# Patient Record
Sex: Female | Born: 1955 | Race: Black or African American | Hispanic: No | Marital: Single | Smoking: Current every day smoker
Health system: Southern US, Community
[De-identification: ages and names within clinical notes are randomized; demographics above are authoritative.]

## PROBLEM LIST (undated history)

## (undated) DIAGNOSIS — I82409 Acute embolism and thrombosis of unspecified deep veins of unspecified lower extremity: Secondary | ICD-10-CM

## (undated) DIAGNOSIS — I2699 Other pulmonary embolism without acute cor pulmonale: Secondary | ICD-10-CM

## (undated) DIAGNOSIS — D649 Anemia, unspecified: Secondary | ICD-10-CM

## (undated) DIAGNOSIS — E119 Type 2 diabetes mellitus without complications: Secondary | ICD-10-CM

## (undated) DIAGNOSIS — K5732 Diverticulitis of large intestine without perforation or abscess without bleeding: Secondary | ICD-10-CM

## (undated) DIAGNOSIS — R52 Pain, unspecified: Secondary | ICD-10-CM

## (undated) DIAGNOSIS — A09 Infectious gastroenteritis and colitis, unspecified: Secondary | ICD-10-CM

## (undated) DIAGNOSIS — I6529 Occlusion and stenosis of unspecified carotid artery: Secondary | ICD-10-CM

## (undated) DIAGNOSIS — C801 Malignant (primary) neoplasm, unspecified: Secondary | ICD-10-CM

## (undated) DIAGNOSIS — G4730 Sleep apnea, unspecified: Secondary | ICD-10-CM

## (undated) DIAGNOSIS — I10 Essential (primary) hypertension: Secondary | ICD-10-CM

## (undated) DIAGNOSIS — E669 Obesity, unspecified: Secondary | ICD-10-CM

## (undated) DIAGNOSIS — T7840XA Allergy, unspecified, initial encounter: Secondary | ICD-10-CM

## (undated) DIAGNOSIS — K635 Polyp of colon: Secondary | ICD-10-CM

## (undated) DIAGNOSIS — M1990 Unspecified osteoarthritis, unspecified site: Secondary | ICD-10-CM

## (undated) DIAGNOSIS — Z85048 Personal history of other malignant neoplasm of rectum, rectosigmoid junction, and anus: Secondary | ICD-10-CM

## (undated) DIAGNOSIS — R002 Palpitations: Secondary | ICD-10-CM

## (undated) DIAGNOSIS — C20 Malignant neoplasm of rectum: Secondary | ICD-10-CM

## (undated) HISTORY — DX: Essential (primary) hypertension: I10

## (undated) HISTORY — DX: Type 2 diabetes mellitus without complications: E11.9

## (undated) HISTORY — PX: COLONOSCOPY: SHX174

## (undated) HISTORY — DX: Obesity, unspecified: E66.9

## (undated) HISTORY — PX: ABDOMINAL HYSTERECTOMY: SHX81

## (undated) HISTORY — DX: Anemia, unspecified: D64.9

## (undated) HISTORY — DX: Polyp of colon: K63.5

## (undated) HISTORY — DX: Infectious gastroenteritis and colitis, unspecified: A09

## (undated) HISTORY — PX: APPENDECTOMY: SHX54

## (undated) HISTORY — DX: Diverticulitis of large intestine without perforation or abscess without bleeding: K57.32

## (undated) HISTORY — DX: Allergy, unspecified, initial encounter: T78.40XA

## (undated) HISTORY — PX: TUBAL LIGATION: SHX77

## (undated) HISTORY — DX: Sleep apnea, unspecified: G47.30

## (undated) HISTORY — DX: Personal history of other malignant neoplasm of rectum, rectosigmoid junction, and anus: Z85.048

## (undated) HISTORY — DX: Other pulmonary embolism without acute cor pulmonale: I26.99

## (undated) HISTORY — DX: Acute embolism and thrombosis of unspecified deep veins of unspecified lower extremity: I82.409

## (undated) HISTORY — DX: Malignant neoplasm of rectum: C20

---

## 1997-12-31 ENCOUNTER — Encounter: Admission: RE | Admit: 1997-12-31 | Discharge: 1997-12-31 | Payer: Self-pay | Admitting: Internal Medicine

## 1998-02-09 ENCOUNTER — Emergency Department (HOSPITAL_COMMUNITY): Admission: EM | Admit: 1998-02-09 | Discharge: 1998-02-09 | Payer: Self-pay | Admitting: Emergency Medicine

## 1998-03-18 ENCOUNTER — Encounter: Admission: RE | Admit: 1998-03-18 | Discharge: 1998-03-18 | Payer: Self-pay | Admitting: Internal Medicine

## 1998-03-27 ENCOUNTER — Encounter: Admission: RE | Admit: 1998-03-27 | Discharge: 1998-03-27 | Payer: Self-pay | Admitting: Internal Medicine

## 1998-04-04 ENCOUNTER — Encounter: Admission: RE | Admit: 1998-04-04 | Discharge: 1998-04-04 | Payer: Self-pay | Admitting: Internal Medicine

## 1998-04-17 ENCOUNTER — Encounter: Admission: RE | Admit: 1998-04-17 | Discharge: 1998-04-17 | Payer: Self-pay | Admitting: Internal Medicine

## 1998-07-23 ENCOUNTER — Encounter: Admission: RE | Admit: 1998-07-23 | Discharge: 1998-07-23 | Payer: Self-pay | Admitting: Hematology and Oncology

## 1998-07-23 ENCOUNTER — Ambulatory Visit (HOSPITAL_COMMUNITY): Admission: RE | Admit: 1998-07-23 | Discharge: 1998-07-23 | Payer: Self-pay | Admitting: Hematology and Oncology

## 1998-08-18 ENCOUNTER — Encounter: Admission: RE | Admit: 1998-08-18 | Discharge: 1998-08-18 | Payer: Self-pay | Admitting: Internal Medicine

## 1998-09-18 ENCOUNTER — Ambulatory Visit: Admission: RE | Admit: 1998-09-18 | Discharge: 1998-09-18 | Payer: Self-pay | Admitting: *Deleted

## 1999-03-19 ENCOUNTER — Encounter: Admission: RE | Admit: 1999-03-19 | Discharge: 1999-03-19 | Payer: Self-pay | Admitting: Obstetrics

## 1999-05-11 ENCOUNTER — Encounter: Admission: RE | Admit: 1999-05-11 | Discharge: 1999-05-11 | Payer: Self-pay | Admitting: Internal Medicine

## 1999-06-09 ENCOUNTER — Encounter: Admission: RE | Admit: 1999-06-09 | Discharge: 1999-06-09 | Payer: Self-pay | Admitting: Internal Medicine

## 1999-07-21 ENCOUNTER — Encounter: Admission: RE | Admit: 1999-07-21 | Discharge: 1999-07-21 | Payer: Self-pay | Admitting: Internal Medicine

## 1999-10-20 ENCOUNTER — Encounter: Admission: RE | Admit: 1999-10-20 | Discharge: 1999-10-20 | Payer: Self-pay | Admitting: Internal Medicine

## 1999-11-10 ENCOUNTER — Encounter: Admission: RE | Admit: 1999-11-10 | Discharge: 1999-11-10 | Payer: Self-pay | Admitting: Internal Medicine

## 1999-11-17 ENCOUNTER — Encounter: Admission: RE | Admit: 1999-11-17 | Discharge: 1999-11-17 | Payer: Self-pay | Admitting: Internal Medicine

## 2000-03-11 ENCOUNTER — Encounter: Admission: RE | Admit: 2000-03-11 | Discharge: 2000-03-11 | Payer: Self-pay | Admitting: Internal Medicine

## 2000-03-25 ENCOUNTER — Encounter: Admission: RE | Admit: 2000-03-25 | Discharge: 2000-03-25 | Payer: Self-pay | Admitting: Internal Medicine

## 2000-04-29 ENCOUNTER — Encounter: Admission: RE | Admit: 2000-04-29 | Discharge: 2000-04-29 | Payer: Self-pay | Admitting: Internal Medicine

## 2000-05-27 ENCOUNTER — Encounter: Admission: RE | Admit: 2000-05-27 | Discharge: 2000-05-27 | Payer: Self-pay | Admitting: Internal Medicine

## 2001-02-02 ENCOUNTER — Encounter: Admission: RE | Admit: 2001-02-02 | Discharge: 2001-02-02 | Payer: Self-pay | Admitting: Internal Medicine

## 2001-02-02 ENCOUNTER — Ambulatory Visit (HOSPITAL_COMMUNITY): Admission: RE | Admit: 2001-02-02 | Discharge: 2001-02-02 | Payer: Self-pay

## 2001-04-27 ENCOUNTER — Encounter: Admission: RE | Admit: 2001-04-27 | Discharge: 2001-04-27 | Payer: Self-pay | Admitting: Obstetrics

## 2001-10-16 ENCOUNTER — Encounter: Admission: RE | Admit: 2001-10-16 | Discharge: 2001-10-16 | Payer: Self-pay | Admitting: Internal Medicine

## 2001-10-24 ENCOUNTER — Encounter: Admission: RE | Admit: 2001-10-24 | Discharge: 2001-10-24 | Payer: Self-pay | Admitting: Internal Medicine

## 2001-11-06 ENCOUNTER — Encounter: Admission: RE | Admit: 2001-11-06 | Discharge: 2001-11-06 | Payer: Self-pay | Admitting: Internal Medicine

## 2002-01-04 ENCOUNTER — Encounter: Admission: RE | Admit: 2002-01-04 | Discharge: 2002-01-04 | Payer: Self-pay | Admitting: Internal Medicine

## 2002-08-02 ENCOUNTER — Encounter: Admission: RE | Admit: 2002-08-02 | Discharge: 2002-08-02 | Payer: Self-pay | Admitting: Internal Medicine

## 2002-11-01 ENCOUNTER — Encounter: Admission: RE | Admit: 2002-11-01 | Discharge: 2002-11-01 | Payer: Self-pay | Admitting: Internal Medicine

## 2003-07-23 ENCOUNTER — Ambulatory Visit (HOSPITAL_COMMUNITY): Admission: RE | Admit: 2003-07-23 | Discharge: 2003-07-23 | Payer: Self-pay | Admitting: Obstetrics

## 2005-09-29 ENCOUNTER — Emergency Department (HOSPITAL_COMMUNITY): Admission: EM | Admit: 2005-09-29 | Discharge: 2005-09-29 | Payer: Self-pay | Admitting: Emergency Medicine

## 2005-11-16 ENCOUNTER — Emergency Department (HOSPITAL_COMMUNITY): Admission: EM | Admit: 2005-11-16 | Discharge: 2005-11-16 | Payer: Self-pay | Admitting: *Deleted

## 2005-11-23 ENCOUNTER — Ambulatory Visit: Payer: Self-pay | Admitting: Internal Medicine

## 2005-11-29 ENCOUNTER — Encounter (HOSPITAL_BASED_OUTPATIENT_CLINIC_OR_DEPARTMENT_OTHER): Admission: RE | Admit: 2005-11-29 | Discharge: 2006-02-15 | Payer: Self-pay | Admitting: Surgery

## 2009-01-08 ENCOUNTER — Encounter (HOSPITAL_BASED_OUTPATIENT_CLINIC_OR_DEPARTMENT_OTHER): Admission: RE | Admit: 2009-01-08 | Discharge: 2009-04-08 | Payer: Self-pay | Admitting: Internal Medicine

## 2009-02-05 ENCOUNTER — Ambulatory Visit: Payer: Self-pay | Admitting: *Deleted

## 2009-02-05 ENCOUNTER — Ambulatory Visit: Admission: RE | Admit: 2009-02-05 | Discharge: 2009-02-05 | Payer: Self-pay | Admitting: Internal Medicine

## 2009-02-05 ENCOUNTER — Encounter: Payer: Self-pay | Admitting: Internal Medicine

## 2009-02-27 ENCOUNTER — Encounter (INDEPENDENT_AMBULATORY_CARE_PROVIDER_SITE_OTHER): Payer: Self-pay | Admitting: Internal Medicine

## 2009-02-27 ENCOUNTER — Ambulatory Visit: Payer: Self-pay | Admitting: Internal Medicine

## 2009-02-27 DIAGNOSIS — E66813 Obesity, class 3: Secondary | ICD-10-CM | POA: Insufficient documentation

## 2009-02-27 DIAGNOSIS — I82409 Acute embolism and thrombosis of unspecified deep veins of unspecified lower extremity: Secondary | ICD-10-CM | POA: Insufficient documentation

## 2009-02-27 DIAGNOSIS — Z720 Tobacco use: Secondary | ICD-10-CM | POA: Insufficient documentation

## 2009-02-27 DIAGNOSIS — Z87891 Personal history of nicotine dependence: Secondary | ICD-10-CM

## 2009-02-27 DIAGNOSIS — I10 Essential (primary) hypertension: Secondary | ICD-10-CM | POA: Insufficient documentation

## 2009-02-27 LAB — CONVERTED CEMR LAB
ALT: 16 units/L (ref 0–35)
AST: 15 units/L (ref 0–37)
Albumin: 3.7 g/dL (ref 3.5–5.2)
Alkaline Phosphatase: 80 units/L (ref 39–117)
BUN: 11 mg/dL (ref 6–23)
Basophils Absolute: 0 10*3/uL (ref 0.0–0.1)
Basophils Relative: 0 % (ref 0–1)
CO2: 28 meq/L (ref 19–32)
Calcium: 9.5 mg/dL (ref 8.4–10.5)
Chloride: 106 meq/L (ref 96–112)
Cholesterol: 134 mg/dL (ref 0–200)
Creatinine, Ser: 0.52 mg/dL (ref 0.40–1.20)
Eosinophils Absolute: 0.2 10*3/uL (ref 0.0–0.7)
Eosinophils Relative: 3 % (ref 0–5)
GFR calc Af Amer: >60 mL/min (ref >60)
GFR calc non Af Amer: >60 mL/min (ref >60)
Glucose, Bld: 104 mg/dL — ABNORMAL HIGH (ref 70–99)
HCT: 38.5 % (ref 36.0–46.0)
HDL: 40 mg/dL (ref >39)
Hemoglobin: 13 g/dL (ref 12.0–15.0)
INR: 1 (ref 0.0–1.5)
LDL Cholesterol: 76 mg/dL (ref 0–99)
Lymphocytes Relative: 29 % (ref 12–46)
Lymphs Abs: 1.5 10*3/uL (ref 0.7–4.0)
MCHC: 33.7 g/dL (ref 30.0–36.0)
MCV: 91.6 fL (ref 78.0–100.0)
Monocytes Absolute: 0.4 10*3/uL (ref 0.1–1.0)
Monocytes Relative: 7 % (ref 3–12)
Neutro Abs: 3.3 10*3/uL (ref 1.7–7.7)
Neutrophils Relative %: 62 % (ref 43–77)
Platelets: 274 10*3/uL (ref 150–400)
Potassium: 4.3 meq/L (ref 3.5–5.3)
Prothrombin Time: 13.5 s (ref 11.6–15.2)
RBC: 4.21 M/uL (ref 3.87–5.11)
RDW: 13.3 % (ref 11.5–15.5)
Sodium: 138 meq/L (ref 135–145)
Total Bilirubin: 0.4 mg/dL (ref 0.3–1.2)
Total CHOL/HDL Ratio: 3.4
Total Protein: 6.7 g/dL (ref 6.0–8.3)
Triglycerides: 89 mg/dL (ref <150)
VLDL: 18 mg/dL (ref 0–40)
WBC: 5.4 10*3/uL (ref 4.0–10.5)
aPTT: 25 s (ref 24–37)

## 2009-03-03 ENCOUNTER — Ambulatory Visit: Payer: Self-pay | Admitting: Internal Medicine

## 2009-03-03 LAB — CONVERTED CEMR LAB: INR: 3.1

## 2009-03-04 ENCOUNTER — Ambulatory Visit (HOSPITAL_COMMUNITY): Admission: RE | Admit: 2009-03-04 | Discharge: 2009-03-04 | Payer: Self-pay | Admitting: Internal Medicine

## 2009-03-10 ENCOUNTER — Ambulatory Visit: Payer: Self-pay | Admitting: Internal Medicine

## 2009-03-10 LAB — CONVERTED CEMR LAB: INR: 5.1

## 2009-03-13 ENCOUNTER — Ambulatory Visit: Payer: Self-pay | Admitting: Internal Medicine

## 2009-03-17 ENCOUNTER — Ambulatory Visit: Payer: Self-pay | Admitting: Internal Medicine

## 2009-03-17 LAB — CONVERTED CEMR LAB: INR: 2.4

## 2009-03-28 ENCOUNTER — Telehealth: Payer: Self-pay | Admitting: *Deleted

## 2009-04-10 ENCOUNTER — Encounter (HOSPITAL_BASED_OUTPATIENT_CLINIC_OR_DEPARTMENT_OTHER): Admission: RE | Admit: 2009-04-10 | Discharge: 2009-07-09 | Payer: Self-pay | Admitting: Internal Medicine

## 2009-04-21 ENCOUNTER — Ambulatory Visit: Payer: Self-pay | Admitting: Internal Medicine

## 2009-04-21 LAB — CONVERTED CEMR LAB: INR: 3.1

## 2009-05-04 ENCOUNTER — Emergency Department (HOSPITAL_COMMUNITY): Admission: EM | Admit: 2009-05-04 | Discharge: 2009-05-05 | Payer: Self-pay | Admitting: Emergency Medicine

## 2009-06-16 ENCOUNTER — Ambulatory Visit: Payer: Self-pay | Admitting: Internal Medicine

## 2009-06-16 LAB — CONVERTED CEMR LAB: INR: 2.7

## 2009-06-25 ENCOUNTER — Ambulatory Visit: Payer: Self-pay | Admitting: Internal Medicine

## 2009-06-25 DIAGNOSIS — R040 Epistaxis: Secondary | ICD-10-CM

## 2009-06-26 LAB — CONVERTED CEMR LAB
HCT: 34.8 % — ABNORMAL LOW (ref 36.0–46.0)
Hemoglobin: 11.9 g/dL — ABNORMAL LOW (ref 12.0–15.0)
MCHC: 34.2 g/dL (ref 30.0–36.0)
MCV: 91.1 fL (ref >=78.0)
Platelets: 290 10*3/uL (ref 150–400)
RBC: 3.82 M/uL — ABNORMAL LOW (ref 3.87–5.11)
RDW: 13.7 % (ref 11.5–15.5)
WBC: 6.1 10*3/uL (ref 4.0–10.5)

## 2009-07-09 ENCOUNTER — Ambulatory Visit: Payer: Self-pay | Admitting: Internal Medicine

## 2009-07-10 ENCOUNTER — Encounter (HOSPITAL_BASED_OUTPATIENT_CLINIC_OR_DEPARTMENT_OTHER): Admission: RE | Admit: 2009-07-10 | Discharge: 2009-08-11 | Payer: Self-pay | Admitting: Internal Medicine

## 2009-07-10 ENCOUNTER — Encounter: Payer: Self-pay | Admitting: Internal Medicine

## 2009-07-14 ENCOUNTER — Ambulatory Visit: Payer: Self-pay | Admitting: Internal Medicine

## 2009-07-14 LAB — CONVERTED CEMR LAB: INR: 2

## 2009-07-19 ENCOUNTER — Emergency Department (HOSPITAL_COMMUNITY): Admission: EM | Admit: 2009-07-19 | Discharge: 2009-07-19 | Payer: Self-pay | Admitting: Emergency Medicine

## 2009-08-04 ENCOUNTER — Ambulatory Visit: Payer: Self-pay | Admitting: Internal Medicine

## 2009-08-04 ENCOUNTER — Encounter: Payer: Self-pay | Admitting: Internal Medicine

## 2009-08-04 LAB — HM COLONOSCOPY

## 2009-08-06 ENCOUNTER — Ambulatory Visit: Payer: Self-pay | Admitting: Internal Medicine

## 2009-08-06 DIAGNOSIS — C20 Malignant neoplasm of rectum: Secondary | ICD-10-CM

## 2009-08-06 DIAGNOSIS — Z85048 Personal history of other malignant neoplasm of rectum, rectosigmoid junction, and anus: Secondary | ICD-10-CM

## 2009-08-06 LAB — CONVERTED CEMR LAB
ALT: 18 units/L (ref 0–35)
AST: 14 units/L (ref 0–37)
Albumin: 3.5 g/dL (ref 3.5–5.2)
Alkaline Phosphatase: 70 units/L (ref 39–117)
BUN: 5 mg/dL — ABNORMAL LOW (ref 6–23)
Basophils Absolute: 0 10*3/uL (ref 0.0–0.1)
Basophils Relative: 0.5 % (ref 0.0–3.0)
CEA: 0.8 ng/mL (ref 0.0–5.0)
CO2: 31 meq/L (ref 19–32)
Calcium: 8.8 mg/dL (ref 8.4–10.5)
Chloride: 103 meq/L (ref 96–112)
Creatinine, Ser: 0.6 mg/dL (ref 0.4–1.2)
Eosinophils Absolute: 0.1 10*3/uL (ref 0.0–0.7)
Eosinophils Relative: 1.4 % (ref 0.0–5.0)
GFR calc non Af Amer: 135.5 mL/min (ref >60)
Glucose, Bld: 103 mg/dL — ABNORMAL HIGH (ref 70–99)
HCT: 34.6 % — ABNORMAL LOW (ref 36.0–46.0)
Hemoglobin: 12.1 g/dL (ref 12.0–15.0)
Lymphocytes Relative: 31.9 % (ref 12.0–46.0)
Lymphs Abs: 2 10*3/uL (ref 0.7–4.0)
MCHC: 35 g/dL (ref 30.0–36.0)
MCV: 93.7 fL (ref 78.0–100.0)
Monocytes Absolute: 0.5 10*3/uL (ref 0.1–1.0)
Monocytes Relative: 8.3 % (ref 3.0–12.0)
Neutro Abs: 3.6 10*3/uL (ref 1.4–7.7)
Neutrophils Relative %: 57.9 % (ref 43.0–77.0)
Platelets: 261 10*3/uL (ref 150.0–400.0)
Potassium: 3.4 meq/L — ABNORMAL LOW (ref 3.5–5.1)
RBC: 3.69 M/uL — ABNORMAL LOW (ref 3.87–5.11)
RDW: 12.6 % (ref 11.5–14.6)
Sodium: 140 meq/L (ref 135–145)
Total Bilirubin: 0.6 mg/dL (ref 0.3–1.2)
Total Protein: 6.6 g/dL (ref 6.0–8.3)
WBC: 6.2 10*3/uL (ref 4.5–10.5)

## 2009-08-07 ENCOUNTER — Telehealth: Payer: Self-pay | Admitting: Internal Medicine

## 2009-08-08 ENCOUNTER — Ambulatory Visit: Payer: Self-pay | Admitting: Cardiology

## 2009-08-12 ENCOUNTER — Encounter: Payer: Self-pay | Admitting: Gastroenterology

## 2009-08-12 ENCOUNTER — Telehealth (INDEPENDENT_AMBULATORY_CARE_PROVIDER_SITE_OTHER): Payer: Self-pay | Admitting: *Deleted

## 2009-08-15 ENCOUNTER — Ambulatory Visit (HOSPITAL_COMMUNITY): Admission: RE | Admit: 2009-08-15 | Discharge: 2009-08-15 | Payer: Self-pay | Admitting: Gastroenterology

## 2009-08-15 ENCOUNTER — Ambulatory Visit: Payer: Self-pay | Admitting: Gastroenterology

## 2009-08-20 ENCOUNTER — Encounter: Payer: Self-pay | Admitting: Internal Medicine

## 2009-08-25 ENCOUNTER — Ambulatory Visit: Payer: Self-pay | Admitting: Internal Medicine

## 2009-08-26 ENCOUNTER — Ambulatory Visit (HOSPITAL_COMMUNITY): Admission: RE | Admit: 2009-08-26 | Discharge: 2009-08-26 | Payer: Self-pay | Admitting: Surgery

## 2009-08-26 ENCOUNTER — Ambulatory Visit: Payer: Self-pay | Admitting: Surgery

## 2009-08-26 ENCOUNTER — Encounter (INDEPENDENT_AMBULATORY_CARE_PROVIDER_SITE_OTHER): Payer: Self-pay | Admitting: Surgery

## 2009-08-27 HISTORY — PX: TRANSANAL RECTAL RESECTION: SHX2562

## 2009-08-28 ENCOUNTER — Telehealth: Payer: Self-pay | Admitting: Internal Medicine

## 2009-09-10 ENCOUNTER — Telehealth: Payer: Self-pay | Admitting: Internal Medicine

## 2009-09-16 ENCOUNTER — Ambulatory Visit: Payer: Self-pay | Admitting: Internal Medicine

## 2009-09-16 ENCOUNTER — Encounter (INDEPENDENT_AMBULATORY_CARE_PROVIDER_SITE_OTHER): Payer: Self-pay | Admitting: Cardiology

## 2009-09-16 LAB — CONVERTED CEMR LAB: POC INR: 1.1

## 2009-09-17 ENCOUNTER — Telehealth (INDEPENDENT_AMBULATORY_CARE_PROVIDER_SITE_OTHER): Payer: Self-pay | Admitting: Cardiology

## 2009-09-22 ENCOUNTER — Inpatient Hospital Stay (HOSPITAL_COMMUNITY): Admission: RE | Admit: 2009-09-22 | Discharge: 2009-09-25 | Payer: Self-pay | Admitting: Surgery

## 2009-09-22 ENCOUNTER — Encounter (INDEPENDENT_AMBULATORY_CARE_PROVIDER_SITE_OTHER): Payer: Self-pay | Admitting: Surgery

## 2009-09-29 ENCOUNTER — Telehealth: Payer: Self-pay | Admitting: Internal Medicine

## 2009-09-29 ENCOUNTER — Ambulatory Visit: Payer: Self-pay | Admitting: Internal Medicine

## 2009-09-29 LAB — CONVERTED CEMR LAB: INR: 1.7

## 2009-10-01 ENCOUNTER — Encounter (INDEPENDENT_AMBULATORY_CARE_PROVIDER_SITE_OTHER): Payer: Self-pay | Admitting: *Deleted

## 2009-10-01 ENCOUNTER — Inpatient Hospital Stay (HOSPITAL_COMMUNITY): Admission: EM | Admit: 2009-10-01 | Discharge: 2009-10-03 | Payer: Self-pay | Admitting: Emergency Medicine

## 2009-10-03 ENCOUNTER — Encounter (INDEPENDENT_AMBULATORY_CARE_PROVIDER_SITE_OTHER): Payer: Self-pay | Admitting: *Deleted

## 2009-10-06 ENCOUNTER — Ambulatory Visit: Payer: Self-pay | Admitting: Internal Medicine

## 2009-10-06 LAB — CONVERTED CEMR LAB: INR: 2

## 2009-10-08 ENCOUNTER — Encounter: Payer: Self-pay | Admitting: Gastroenterology

## 2009-10-13 ENCOUNTER — Ambulatory Visit: Payer: Self-pay | Admitting: Internal Medicine

## 2009-10-13 LAB — CONVERTED CEMR LAB: INR: 2.3

## 2009-10-15 ENCOUNTER — Encounter: Payer: Self-pay | Admitting: Internal Medicine

## 2009-10-16 ENCOUNTER — Encounter (INDEPENDENT_AMBULATORY_CARE_PROVIDER_SITE_OTHER): Payer: Self-pay | Admitting: *Deleted

## 2009-10-16 ENCOUNTER — Encounter: Admission: RE | Admit: 2009-10-16 | Discharge: 2009-10-16 | Payer: Self-pay | Admitting: Surgery

## 2009-11-05 ENCOUNTER — Encounter: Payer: Self-pay | Admitting: Internal Medicine

## 2009-12-01 ENCOUNTER — Ambulatory Visit: Payer: Self-pay | Admitting: Internal Medicine

## 2009-12-01 DIAGNOSIS — R319 Hematuria, unspecified: Secondary | ICD-10-CM

## 2010-01-21 ENCOUNTER — Ambulatory Visit: Payer: Self-pay | Admitting: Vascular Surgery

## 2010-01-21 ENCOUNTER — Encounter (INDEPENDENT_AMBULATORY_CARE_PROVIDER_SITE_OTHER): Payer: Self-pay | Admitting: Internal Medicine

## 2010-01-30 ENCOUNTER — Ambulatory Visit (HOSPITAL_COMMUNITY): Admission: RE | Admit: 2010-01-30 | Discharge: 2010-01-30 | Payer: Self-pay | Admitting: Infectious Disease

## 2010-01-30 ENCOUNTER — Ambulatory Visit: Payer: Self-pay | Admitting: Internal Medicine

## 2010-01-30 ENCOUNTER — Encounter: Payer: Self-pay | Admitting: Internal Medicine

## 2010-04-02 ENCOUNTER — Ambulatory Visit (HOSPITAL_COMMUNITY): Admission: RE | Admit: 2010-04-02 | Discharge: 2010-04-02 | Payer: Self-pay | Admitting: Internal Medicine

## 2010-04-02 LAB — HM MAMMOGRAPHY: HM Mammogram: NEGATIVE

## 2010-04-13 ENCOUNTER — Ambulatory Visit: Payer: Self-pay | Admitting: Internal Medicine

## 2010-04-13 LAB — CONVERTED CEMR LAB
ALT: 10 units/L (ref 0–35)
AST: 12 units/L (ref 0–37)
Albumin: 4.1 g/dL (ref 3.5–5.2)
Alkaline Phosphatase: 72 units/L (ref 39–117)
BUN: 12 mg/dL (ref 6–23)
Basophils Absolute: 0 10*3/uL (ref 0.0–0.1)
Basophils Relative: 0 % (ref 0–1)
CO2: 24 meq/L (ref 19–32)
Calcium: 9.3 mg/dL (ref 8.4–10.5)
Chloride: 108 meq/L (ref 96–112)
Creatinine, Ser: 0.72 mg/dL (ref 0.40–1.20)
Eosinophils Absolute: 0.1 10*3/uL (ref 0.0–0.7)
Eosinophils Relative: 2 % (ref 0–5)
Glucose, Bld: 93 mg/dL (ref 70–99)
HCT: 36.1 % (ref 36.0–46.0)
Hemoglobin: 11.7 g/dL — ABNORMAL LOW (ref 12.0–15.0)
Lymphocytes Relative: 30 % (ref 12–46)
Lymphs Abs: 2 10*3/uL (ref 0.7–4.0)
MCHC: 32.4 g/dL (ref 30.0–36.0)
MCV: 91.9 fL (ref >=78.0)
Monocytes Absolute: 0.4 10*3/uL (ref 0.1–1.0)
Monocytes Relative: 6 % (ref 3–12)
Neutro Abs: 4.1 10*3/uL (ref 1.7–7.7)
Neutrophils Relative %: 62 % (ref 43–77)
Platelets: 322 10*3/uL (ref 150–400)
Potassium: 4.1 meq/L (ref 3.5–5.3)
RBC: 3.93 M/uL (ref 3.87–5.11)
RDW: 14.5 % (ref 11.5–15.5)
Sodium: 142 meq/L (ref 135–145)
Total Bilirubin: 0.3 mg/dL (ref 0.3–1.2)
Total Protein: 6.9 g/dL (ref 6.0–8.3)
WBC: 6.6 10*3/uL (ref 4.0–10.5)

## 2010-05-20 ENCOUNTER — Encounter: Payer: Self-pay | Admitting: Internal Medicine

## 2010-10-17 DIAGNOSIS — Z7901 Long term (current) use of anticoagulants: Secondary | ICD-10-CM

## 2010-10-17 DIAGNOSIS — I82409 Acute embolism and thrombosis of unspecified deep veins of unspecified lower extremity: Secondary | ICD-10-CM

## 2010-10-18 ENCOUNTER — Encounter: Payer: Self-pay | Admitting: Internal Medicine

## 2010-10-27 NOTE — Assessment & Plan Note (Signed)
Summary: COU/VS  Anticoagulant Therapy Managed by: [name]. Janie Morning  PharmD CACP Referring MD: Tenny Craw MD, Gunnar Fusi PCP: Jason Coop MD [facility] Attending: Josem Kaufmann MD, Lawrence Indication 1: Deep vein thrombus Indication 2: Encounter for therapeutic drug monitoring  V58.83  Patient Assessment Reviewed by: Chancy Milroy PharmD  October 06, 2009 Medication review: verified warfarin dosage & schedule,verified previous prescription medications, verified doses & any changes, verified new medications, reviewed OTC medications, reviewed OTC health products-vitamins supplements etc Complications: none Dietary changes: none   Health status changes: none   Lifestyle changes: none   Recent/future hospitalizations: none   Recent/future procedures: none   Recent/future dental: none Patient Assessment Part 2:  Have you MISSED ANY DOSES or CHANGED TABLETS?  No missed Warfarin doses or changed tablets.  Have you had any BRUISING or BLEEDING ( nose or gum bleeds,blood in urine or stool)?  No reported bruising or bleeding in nose, gums, urine, stool.  Have you STARTED or STOPPED any MEDICATIONS, including OTC meds,herbals or supplements?  No other medications or herbal supplements were started or stopped.  Have you CHANGED your DIET, especially green vegetables,or ALCOHOL intake?  No changes in diet or alcohol intake.  Have you had any ILLNESSES or HOSPITALIZATIONS?  No reported illnesses or hospitalizations  Have you had any signs of CLOTTING?(chest discomfort,dizziness,shortness of breath,arms tingling,slurred speech,swelling or redness in leg)    No chest discomfort, dizziness, shortness of breath, tingling in arm, slurred speech, swelling, or redness in leg.     Treatment  Target INR: 2.0-3.0 INR: 2.0  Date: 10/06/2009 Regimen In:  42.5mg /week INR reflects regimen in: 2.0  New  Tablet strength: : 5mg  Regimen Out:     Sunday: 1 & 1/2 Tablet     Monday: 1 Tablet     Tuesday: 1 & 1/2  Tablet     Wednesday: 1 & 1/2 Tablet     Thursday: 1 Tablet      Friday: 1 & 1/2 Tablet     Saturday: 1 & 1/2 Tablet Total Weekly: 47.5mg /week mg  Next INR Due: 10/13/2009 Adjusted by: [name]. Alexandria Lodge [name] PharmD CACP   Return to anticoagulation clinic:  10/13/2009 Time of next visit: 1430   Comments: Patient counseled/cautioned regarding falls avoidance with impending inclement/icy conditions expected within next 1-2 days.  Allergies: 1)  ! Aspirin

## 2010-10-27 NOTE — Letter (Signed)
Summary: [facility]   Imported By: Sherian Rein 10/21/2009 12:28:22  _____________________________________________________________________  External Attachment:    Type:   Image     Comment:   External Document

## 2010-10-27 NOTE — Assessment & Plan Note (Signed)
Summary: ROUTINE CK/EST/VS   Vital Signs:  Patient profile:   55 year old female Height:      66 inches (167.64 cm) Weight:      257.8 pounds (117.18 kg) BMI:     41.76 Temp:     97.1 degrees F (36.17 degrees C) oral Pulse rate:   75 / minute BP sitting:   156 / 85  (right arm)  Vitals Entered By: Stanton Kidney Ditzler RN (December 01, 2009 2:51 PM) Pain Assessment Patient in pain? yes     Location: back Intensity: 5 Type: aching Onset of pain  over 1 year Nutritional Status BMI of > 30 = obese Nutritional Status Detail appetite  Have you ever been in a relationship where you felt threatened, hurt or afraid?denies   Does patient need assistance? Functional Status Self care Ambulation Normal Comments Ck-up - part of colon removed 08/2009 - colon ca Dr Leone Payor - reck 08/2010. Problems with feeling bloated after eating.   Primary Care Provider:  Jason Coop MD   History of Present Illness: Alicia Martinez is a 55 yo lady with PMH as outlined in the EMR comes today for a f/u visit.   1. Colon Ca: Pt was diagnoed with Stage 1 adenocarcinoma of colon on screening colonoscopy and she had removal of rectum and sigmoid colon. She is not getting any chemo or radiation. She has supposed to get repeat colonoscopy at the end of this years.   2. Tobacco abuse: She was smoking before 12/10 and not since then.   3. HTN: She is not on any medicine so far.   4. DVT: Dr. Alexandria Lodge follows her INR. She is taking her coumadin regularly.   5. Back Pain: Pt is having low back pain for more than a year. It feels like the pain when she does had fibroids. It comes and go. No trauma or related event that may have triggered the pain. No radiation. No bladder incontinece. She had some incontinence with bowel, but after surgery for her colon cancer, which her surgeon thought was expected. No fever or chills. No aggravating factors, and it is better with lying down. Sometimes the pain is very severe.   6.  Epistaxis: Pt was not able to see her ENT and she is still having epistaxis.   Depression History:      The patient denies a depressed mood most of the day and a diminished interest in her usual daily activities.         Preventive Screening-Counseling & Management  Alcohol-Tobacco     Smoking Status: quit     Smoking Cessation Counseling: yes     Packs/Day: 1/2 ppd  Caffeine-Diet-Exercise     Does Patient Exercise: no  Current Medications (verified): 1)  Warfarin Sodium 5 Mg Tabs (Warfarin Sodium) .... Take One By Mouth Once Daily 2)  Advil 200 Mg Tabs (Ibuprofen) .... Take One By Mouth As Needed 3)  Hydrochlorothiazide 25 Mg Tabs (Hydrochlorothiazide) .... Take 1 Pill By Mouth Daily.  Allergies: 1)  ! Aspirin  Social History: Smoking Status:  quit  Review of Systems      See HPI  Physical Exam  General:  alert.   Mouth:  pharynx pink and moist.   Lungs:  normal breath sounds, no crackles, and no wheezes.   Heart:  normal rate, regular rhythm, no murmur, no gallop, and no rub.   Abdomen:  soft and non-tender.   Msk:  Back Exam: Pt has tenderness on  the right SI joint area and no tenderness on the midline area.   Neurologic:  alert & oriented X3.     Impression & Recommendations:  Problem # 1:  HYPERTENSION (ICD-401.9) Pt has persistently elevated BP for the past few readings. I will start HCTZ, she will initially take half a pill daily for 1 wk and then she will take a full pill daily. Pt had CMET in 1/11 and had normal K and Cr. Will get chemistry on f/u.  Her updated medication list for this problem includes:    Hydrochlorothiazide 25 Mg Tabs (Hydrochlorothiazide) .Marland Kitchen... Take 1 pill by mouth daily.  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   Orders: T-Comprehensive Metabolic Panel ([id number])  Problem # 3:  COUMADIN THERAPY (WARFARIN) (ICD-V58.61) She will f/u with Dr. Alexandria Lodge. I think she will benefit from taking extra fiber, especially green vegetables  because she already has colon cancer and diverticulosis. However, this will significantly interact with INR. I will like to see what Dr. Alexandria Lodge thinks. 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 But the more important point is that she had colon adenocarnoma and this certainly increases her risk of DVT, at least in the future. On pathology report, they did not mention whether this is mucinous or not as mucinous adenocarcinoma significantly increases the risk of VTE. I doubt how much the diagnosis of stage 1 colon caner diagnosed few months ago increased her risk of DVTs, which she first started having more than a decade ago.  Orders: T-Comprehensive Metabolic Panel ([id number])  Problem # 4:  EPISTAXIS (ICD-784.7) Pt still has epistaxis, will refer her to ENT to r/o if she has any ENT related problem, although this could solely be related to her anti-coagulation.   Problem # 5:  HEMATOCHEZIA (ICD-578.1) She denies any more rectal bleed.  Orders: T-Comprehensive Metabolic Panel ([id number]) T-CBC No Diff ([id number])  Problem # 6:  OBESITY (ICD-278.00) We discussed in detail regarding the need to loose wt. I will refer her to D. Riley's class.  Orders: T-Lipid Profile ([id number])  Problem # 7:  TOBACCO ABUSE (ICD-305.1) She has quit and I praised the effort.   Problem # 8:  HEALTH MAINTENANCE EXAM (ICD-V70.0) Pap smear on enxt appt.  Mammogram in a few months.   Problem # 9:  BACK PAIN (ICD-724.5)  Pt has Back pain, especially at the right SI joint area, but not in the midline. She had a recent CT scan of abd/pelvis, and she was not found to have any mets. 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 I will get an xray to check for SI joint arthritis.  Her updated medication list for this problem includes:    Advil 200 Mg Tabs (Ibuprofen) .Marland Kitchen... Take one by mouth as needed  Orders: Diagnostic X-Ray/Fluoroscopy (Diagnostic X-Ray/Flu)  Complete Medication List: 1)  Warfarin Sodium 5 Mg  Tabs (Warfarin sodium) .... Take one by mouth once daily 2)  Advil 200 Mg Tabs (Ibuprofen) .... Take one by mouth as needed 3)  Hydrochlorothiazide 25 Mg Tabs (Hydrochlorothiazide) .... Take 1 pill by mouth daily.  Patient Instructions: 1)  Please schedule a follow-up appointment in 1 month. 2)  Please sign up for [facility] [facility] extreme makeover class.  3)  Limit your Sodium (Salt) to less than 2 grams a day(slightly less than 1/2 a teaspoon) to prevent fluid retention, swelling, or worsening of symptoms. 4)  It is important that you exercise regularly at least 20 minutes 5 times a week. If you develop chest pain, have severe difficulty breathing, or feel very tired , stop exercising immediately and seek medical attention. 5)  You need to lose weight. Consider a lower calorie diet and regular exercise.  6)  Check your Blood Pressure regularly. If it is above: you should make an appointment. Prescriptions: WARFARIN SODIUM 5 MG TABS (WARFARIN SODIUM) take one by mouth once daily  #30 x 2   Entered and Authorized by:   Jason Coop MD   Signed by:   Jason Coop MD on 12/01/2009   Method used:   Electronically to        [facility] [address] (retail)       [address]       [place], [place]  [id number]       Ph: [phone]       Fax: [phone]   RxID:    [id number] HYDROCHLOROTHIAZIDE 25 MG TABS (HYDROCHLOROTHIAZIDE) take 1 pill by mouth daily.  #30 x 2   Entered and Authorized by:   Jason Coop MD   Signed by:   Jason Coop MD on 12/01/2009   Method used:   Electronically to        [facility] (retail)       [address]       [place], [place]  [id number]       Ph: [phone]       Fax: [phone]   RxID:   [id number]  Process Orders Check Orders Results:     [facility]: ABN not required for this insurance Tests Sent for requisitioning (December 02, 2009 4:01 PM):     12/01/2009: [facility] -- T-Comprehensive Metabolic Panel [[id number]] (signed)     12/01/2009: [facility] -- T-CBC No Diff [[id number]] (signed)     12/01/2009: [facility] -- T-Lipid Profile [phone] (signed)    Prevention & Chronic Care Immunizations   Influenza vaccine: Not documented    Tetanus booster: Not documented    Pneumococcal vaccine: Not documented  Colorectal Screening   Hemoccult: Not documented    Colonoscopy: DONE  (08/04/2009)   Colonoscopy due: 07/2010  Other Screening   Pap smear: Not documented    Mammogram: ASSESSMENT: Negative - BI-RADS 1^MM DIGITAL SCREENING  (03/04/2009)   Smoking status: quit  (12/01/2009)  Lipids   Total Cholesterol: 134  (02/27/2009)   LDL: 76  (02/27/2009)   LDL Direct: Not documented   HDL: 40  (02/27/2009)   Triglycerides: 89  (02/27/2009)  Hypertension   Last Blood Pressure: 156 / 85  (12/01/2009)   Serum creatinine: 0.6  (08/06/2009)   Serum potassium 3.4  (08/06/2009) CMP ordered   Self-Management Support :    Hypertension self-management support: Resources for patients handout  (12/01/2009)      Resource handout printed.  Process Orders  Check Orders Results:     [facility]: ABN not required for this insurance Tests Sent for requisitioning (December 02, 2009 4:01 PM):     12/01/2009: [facility] -- T-Comprehensive Metabolic Panel [[id number]] (signed)     12/01/2009: [facility] -- T-CBC No Diff [[id number]] (signed)     12/01/2009: [facility] -- T-Lipid Profile [phone] (signed)

## 2010-10-27 NOTE — Progress Notes (Signed)
Summary: lovenox question  Phone Note Call from Patient   Caller: patient  Call For: CVRR Summary of Call: Pt wants to know if she should take warfarin with lovenox? Initial call taken by: Shelby Dubin PharmD, BCPS, CPP,  September 17, 2009 4:44 PM  Follow-up for Phone Call        Instructed patient to stay off warfarin until post-procedure while taking lovenox.   Follow-up by: Shelby Dubin PharmD, BCPS, CPP,  September 17, 2009 4:46 PM

## 2010-10-27 NOTE — Assessment & Plan Note (Signed)
Summary: COU/CH  Anticoagulant Therapy Managed by: [name]. Alicia Martinez  PharmD CACP Referring MD: Tenny Craw MD, Gunnar Fusi PCP: Jason Coop MD [facility] Attending: Darl Pikes, Beth Indication 1: Deep vein thrombus Indication 2: Encounter for therapeutic drug monitoring  V58.83  Patient Assessment Reviewed by: Chancy Milroy PharmD  October 13, 2009 Medication review: verified warfarin dosage & schedule,verified previous prescription medications, verified doses & any changes, verified new medications, reviewed OTC medications, reviewed OTC health products-vitamins supplements etc Complications: none Dietary changes: none   Health status changes: none   Lifestyle changes: none   Recent/future hospitalizations: none   Recent/future procedures: none   Recent/future dental: none Patient Assessment Part 2:  Have you MISSED ANY DOSES or CHANGED TABLETS?  No missed Warfarin doses or changed tablets.  Have you had any BRUISING or BLEEDING ( nose or gum bleeds,blood in urine or stool)?  No reported bruising or bleeding in nose, gums, urine, stool.  Have you STARTED or STOPPED any MEDICATIONS, including OTC meds,herbals or supplements?  No other medications or herbal supplements were started or stopped.  Have you CHANGED your DIET, especially green vegetables,or ALCOHOL intake?  No changes in diet or alcohol intake.  Have you had any ILLNESSES or HOSPITALIZATIONS?  No reported illnesses or hospitalizations  Have you had any signs of CLOTTING?(chest discomfort,dizziness,shortness of breath,arms tingling,slurred speech,swelling or redness in leg)    No chest discomfort, dizziness, shortness of breath, tingling in arm, slurred speech, swelling, or redness in leg.     Treatment  Target INR: 2.0-3.0 INR: 2.3  Date: 10/13/2009 Regimen In:  47.5mg /week INR reflects regimen in: 2.3  New  Tablet strength: : 5mg  Regimen Out:     Sunday: 1 & 1/2 Tablet     Monday: 2 Tablet     Tuesday: 1 & 1/2  Tablet     Wednesday: 1 & 1/2 Tablet     Thursday: 2 Tablet      Friday: 1 & 1/2 Tablet     Saturday: 1 & 1/2 Tablet Total Weekly: 57.5mg /week mg  Next INR Due: 11/03/2009 Adjusted by: [name]. Alexandria Lodge [name] PharmD CACP   Return to anticoagulation clinic:  11/03/2009 Time of next visit: 1445    Allergies: 1)  ! Aspirin

## 2010-10-27 NOTE — Letter (Signed)
Summary: [facility]   Imported By: Sherian Rein 02/24/2010 07:57:29  _____________________________________________________________________  External Attachment:    Type:   Image     Comment:   External Document

## 2010-10-27 NOTE — Letter (Signed)
Summary: [facility]   Imported By: Sherian Rein 11/12/2009 07:51:08  _____________________________________________________________________  External Attachment:    Type:   Image     Comment:   External Document

## 2010-10-27 NOTE — Assessment & Plan Note (Signed)
Summary: F/U/EST/VS   Vital Signs:  Patient profile:   55 year old female Height:      66 inches Weight:      250.7 pounds BMI:     40.61 Temp:     97.9 degrees F oral Pulse rate:   96 / minute BP sitting:   139 / 79  (right arm)  Vitals Entered By: Filomena Jungling NT II (Jan 30, 2010 3:01 PM) CC: BACK PAIN  Is Patient Diabetic? No Pain Assessment Patient in pain? yes     Location: back Intensity: 7 Type: aching Onset of pain  Chronic Nutritional Status BMI of > 30 = obese  Have you ever been in a relationship where you felt threatened, hurt or afraid?No   Does patient need assistance? Functional Status Self care Ambulation Normal   Primary Care Provider:  Jason Coop MD  CC:  BACK PAIN .  History of Present Illness: Alicia Martinez is a 55 yo lady with PMH as outlined in the EMR comes today for a f/u visit.   1. Epistaxis: she is still having epistaxis. She has not seen an ENT doctor.   2. Rectal Cancer: Stable, she is followed up with dr. Michaell Cowing. She will get a repeat colonoscopy in 3 month.   3. Epistaxis:  4. Back Pain: Back still hurts and sometimes she has difficulty sleeping at night. She had not had a chance to do an x-ray last time.   5. Obesity: She lost 7 lbs and it is only from work.   6. HTN: She is taking HCTZ regularly.   Preventive Screening-Counseling & Management  Alcohol-Tobacco     Smoking Status: quit     Smoking Cessation Counseling: yes     Packs/Day: 1/2 ppd  Caffeine-Diet-Exercise     Does Patient Exercise: no  Current Medications (verified): 1)  Warfarin Sodium 5 Mg Tabs (Warfarin Sodium) .... Take One By Mouth Once Daily 2)  Advil 200 Mg Tabs (Ibuprofen) .... Take One By Mouth As Needed 3)  Hydrochlorothiazide 25 Mg Tabs (Hydrochlorothiazide) .... Take 1 Pill By Mouth Daily.  Allergies: 1)  ! Aspirin  Review of Systems      See HPI  Physical Exam  Mouth:  pharynx pink and moist.   Lungs:  normal breath sounds,  no crackles, and no wheezes.   Heart:  normal rate, regular rhythm, no murmur, and no gallop.   Extremities:  trace left pedal edema and trace right pedal edema.   Neurologic:  alert & oriented X3.     Impression & Recommendations:  Problem # 1:  BACK PAIN (ICD-724.5) Pt did not get x ray of her back last time, plan is same as last visit.   Her updated medication list for this problem includes:    Advil 200 Mg Tabs (Ibuprofen) .Marland Kitchen... 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   Problem # 3:  COUMADIN THERAPY (WARFARIN) (ICD-V58.61) cont coumadin for DVT.   Problem # 4:  EPISTAXIS (ICD-784.7) problem is active and I encouraged her to see an ENT.   Problem # 5:  HEMATOCHEZIA (ICD-578.1) no more present.   Problem # 6:  HYPERTENSION (ICD-401.9) BP improved, cont same. Check CMET.  Her updated medication list for this problem includes:    Hydrochlorothiazide 25 Mg Tabs (Hydrochlorothiazide) .Marland Kitchen... Take 1 pill by mouth daily.  BP today: 139/79 Prior BP: 156/85 (12/01/2009)  Labs Reviewed: K+:  3.4 (08/06/2009) Creat: : 0.6 (08/06/2009)   Chol: 134 (02/27/2009)   HDL: 40 (02/27/2009)   LDL: 76 (02/27/2009)   TG: 89 (02/27/2009)  Problem # 7:  OBESITY (ICD-278.00) Encouraged to lose wt by being active and chekcing diet.   Complete Medication List: 1)  Warfarin Sodium 5 Mg Tabs (Warfarin sodium) .... Take one by mouth once daily 2)  Advil 200 Mg Tabs (Ibuprofen) .... Take one by mouth as needed 3)  Hydrochlorothiazide 25 Mg Tabs (Hydrochlorothiazide) .... Take 1 pill by mouth daily.  Patient Instructions: 1)  Please schedule a follow-up appointment in 1 month. 2)  Limit your Sodium (Salt) to less than 2 grams a day(slightly less than 1/2 a teaspoon) to prevent fluid retention, swelling, or worsening of symptoms. 3)  It is important that you exercise regularly at least 20 minutes 5 times a week. If you develop chest  pain, have severe difficulty breathing, or feel very tired , stop exercising immediately and seek medical attention. 4)  You need to lose weight. Consider a lower calorie diet and regular exercise.  5)  Check your Blood Pressure regularly. If it is above: you should make an appointment.  Prevention & Chronic Care Immunizations   Influenza vaccine: Not documented   Influenza vaccine deferral: Deferred  (01/30/2010)    Tetanus booster: Not documented   Td booster deferral: Deferred  (01/30/2010)    Pneumococcal vaccine: Not documented  Colorectal Screening   Hemoccult: Not documented   Hemoccult action/deferral: Deferred  (01/30/2010)    Colonoscopy: DONE  (08/04/2009)   Colonoscopy due: 07/2010  Other Screening   Pap smear: Not documented   Pap smear action/deferral: Deferred  (01/30/2010)    Mammogram: ASSESSMENT: Negative - BI-RADS 1^MM DIGITAL SCREENING  (03/04/2009)   Smoking status: quit  (01/30/2010)  Lipids   Total Cholesterol: 134  (02/27/2009)   LDL: 76  (02/27/2009)   LDL Direct: Not documented   HDL: 40  (02/27/2009)   Triglycerides: 89  (02/27/2009)  Hypertension   Last Blood Pressure: 139 / 79  (01/30/2010)   Serum creatinine: 0.6  (08/06/2009)   Serum potassium 3.4  (08/06/2009)    Hypertension flowsheet reviewed?: Yes   Progress toward BP goal: Unchanged  Self-Management Support :    Hypertension self-management support: Written self-care plan  (01/30/2010)   Hypertension self-care plan printed.

## 2010-10-27 NOTE — Assessment & Plan Note (Signed)
Summary: ACUTE-OUT OF MEDS/CFB(SAWHNEY)   Vital Signs:  Patient profile:   55 year old female Height:      66 inches Weight:      261.6 pounds BMI:     42.38 Temp:     98.8 degrees F oral Pulse rate:   99 / minute BP sitting:   145 / 83  (right arm)  Vitals Entered By: Filomena Jungling NT II (April 13, 2010 1:40 PM) CC: HIP JOINT , SOME SWELLING Is Patient Diabetic? No Pain Assessment Patient in pain? yes     Location: hip joint Intensity: 8 Type: aching Onset of pain  Chronic Nutritional Status BMI of > 30 = obese  Have you ever been in a relationship where you felt threatened, hurt or afraid?No   Does patient need assistance? Functional Status Self care Ambulation Normal   Primary Care Provider:  Elyse Jarvis  CC:  HIP JOINT  and SOME SWELLING.  History of Present Illness: Patient is a 55 year old female who presents today complaining of pain in her buttocks.  The pain has been present, she thinks, for over a year.  The pain is a sharp pain in the right buttocks that hurts worst when she is sitting for a long time, standing, and walking.  When she lies down it feels better.  She has tried heat and ice and neither of those help her either.  She has been taking "pain reliever" up to 10-15 pills per day.  She states they are the white box with red writing which sounds like generic tylenol.  She denies radiation of the pain, weakness, numbness, tingling, loss of control of bladder, jaundice, or abdominal pain.   She is also out of her medications including Coumadin and Hydrochlorothiazide.  She has a PMH of recurrent DVT and a PE as well so she needs to be continued on Coumadin. She was last seen in Coumadin clinic in January 2011.  Preventive Screening-Counseling & Management  Alcohol-Tobacco     Smoking Status: quit     Smoking Cessation Counseling: yes     Packs/Day: 1/2 ppd  Caffeine-Diet-Exercise     Does Patient Exercise: no  Current Medications (verified): 1)   Warfarin Sodium 5 Mg Tabs (Warfarin Sodium) .... Take As Directed 2)  Advil 200 Mg Tabs (Ibuprofen) .... Take One By Mouth As Needed 3)  Hydrochlorothiazide 25 Mg Tabs (Hydrochlorothiazide) .... Take 1/2 Pill By Mouth Daily For Blood Pressure.  Allergies (verified): 1)  ! Aspirin  Past History:  Past medical, surgical, family and social histories (including risk factors) reviewed for relevance to current acute and chronic problems.  Past Medical History: Reviewed history from 07/09/2009 and no changes required. DVT LLE and Pulmonary embolism Sleep Apnea Urinary Tract Infection  Past Surgical History: Reviewed history from 07/09/2009 and no changes required. Hysterectomy  Family History: Reviewed history from 07/09/2009 and no changes required. Mother had cancer unknown type cancer patient thinks bone marrow cancer. No FH of Colon Cancer: Family History of Heart Disease: sisters, brother Family History of Clotting disorder: brother  Social History: Reviewed history from 07/09/2009 and no changes required. Occupation: Location manager single three boys Patient has quit tobacco previous 1/2 ppd habit Alcohol Use - no Daily Caffeine Use 2 per day Illicit Drug Use - no Patient does not get regular exercise.   Review of Systems      See HPI  Physical Exam  Additional Exam:  General: Well developed, female in no acute distress  Head: Atraumatic, normocephalic with no signs of trauma  Eyes: PERRLA, EOM intact  Ears: TM intact  Nose: Nares patent, mucosa is pink and moist, no polyps noted  Mouth: Mucosa is pink and moist, good dention,   Neck: Supple, full ROM, no thyromegaly or masses noted  Resp: Clear to ascultation bilaterally, no wheezes, rales, or rhonchi noted  CV: Regular rate and rhythm with no rubs, or gallops noted, there is a 2/6 systolic murmur best heard over the Left sternal border Abdomen: Soft, non-tender, non-distended with normal bowel sounds    Musculoskelatal: Back ROM produces pain to right bend and right twist but normal otherwise.  There is tenderness to palpation over the right SI joint.  Hip ROM for left is normal, right produces pain with internal rotation in the SI joint.  Knee and ankle ROM and strength is normal.  Neurologic: Alert and oriented x3, Cranial nerves II-XII grossly intact, DTR normal and symmetric  Pulses: Radial, brachial, carotid, femoral, dorsal pedis, and posterior tibial pulses equal and symmetric     Impression & Recommendations:  Problem # 1:  BACK PAIN (ICD-724.5) She has had a plain X-ray of the SI joints done in the past.  I reviewed these films with her and they were normal.  We will do a trial of PT and use tylenol on a scheduled basis for the pain for now.  We discussed the proper dosage of tylenol and side effects of overuse.  The following medications were removed from the medication list:    Advil 200 Mg Tabs (Ibuprofen) .Marland Kitchen... Take one by mouth as needed  Orders: Physical Therapy Referral (PT)  Problem # 2:  COUMADIN THERAPY (WARFARIN) (ICD-V58.61) We will restart her coumadin therapy because of the recurrent DVTs and the fact she has a PE from the last episode.  We will start her previous dose and schedule her for Dr. Alexandria Lodge for August 1st.  Orders: T-CMP with Estimated GFR ([id number])  Problem # 3:  HYPERTENSION (ICD-401.9) Her blood pressure today is a bit above goal.  She has not had her medication for at least 2-3 weeks.  We will restart her HCTZ at 25 mg 1/2 tablet daily since she is just above the goal of 140/90 and recheck her blood pressure in 1 month.  Her updated medication list for this problem includes:    Hydrochlorothiazide 25 Mg Tabs (Hydrochlorothiazide) .Marland Kitchen... Take 1/2 pill by mouth daily for blood pressure.  Orders: T-CBC w/Diff ([id number])  BP today: 145/83 Prior BP: 139/79 (01/30/2010)  Labs Reviewed: K+: 3.4 (08/06/2009) Creat: : 0.6 (08/06/2009)   Chol: 134  (02/27/2009)   HDL: 40 (02/27/2009)   LDL: 76 (02/27/2009)   TG: 89 (02/27/2009)  Complete Medication List: 1)  Warfarin Sodium 5 Mg Tabs (Warfarin sodium) .... Take as directed 2)  Hydrochlorothiazide 25 Mg Tabs (Hydrochlorothiazide) .... Take 1/2 pill by mouth daily for blood pressure.  Patient Instructions: 1)  Take the Coumadin as directed. 2)  Restart Hydrochlorothiazide 25 mg take 1/2 tablet daily. 3)  Take Tylenol (acetaminophen) 500 mg tablets 1-2 every 6 hours as needed for pain. 4)  See Physical therapy for help with pain relief 5)  Please schedule a follow-up appointment in 1 month. Prescriptions: WARFARIN SODIUM 5 MG TABS (WARFARIN SODIUM) Take as directed  #90 x 3   Entered and Authorized by:   Leodis Sias MD   Signed by:   Leodis Sias MD on 04/13/2010   Method used:   Electronically to  [facility] [address] (retail)       [phone]       [place], [place]  [address]       Ph: [phone]       Fax: [phone]   RxID:   [id number] HYDROCHLOROTHIAZIDE 25 MG TABS (HYDROCHLOROTHIAZIDE) take 1/2 pill by mouth daily for blood pressure.  #30 x 3   Entered and Authorized by:   Leodis Sias MD   Signed by:   Leodis Sias MD on 04/13/2010   Method used:   Electronically to        [facility] [address] (retail)       [phone]       [place], [place]  [address]       Ph: [phone]       Fax: [phone]   RxID:   [id number]   Prevention & Chronic Care Immunizations   Influenza vaccine: Not documented   Influenza vaccine deferral: Deferred  (01/30/2010)    Tetanus booster: Not documented   Td booster deferral: Deferred  (01/30/2010)    Pneumococcal vaccine: Not documented  Colorectal Screening   Hemoccult: Not documented   Hemoccult action/deferral: Deferred  (01/30/2010)    Colonoscopy: DONE  (08/04/2009)   Colonoscopy due:  07/2010  Other Screening   Pap smear: Not documented   Pap smear action/deferral: Deferred  (01/30/2010)    Mammogram: ASSESSMENT: Negative - BI-RADS 1^MM DIGITAL SCREENING  (03/04/2009)   Mammogram action/deferral: Ordered  (04/13/2010)   Smoking status: quit  (04/13/2010)  Lipids   Total Cholesterol: 134  (02/27/2009)   LDL: 76  (02/27/2009)   LDL Direct: Not documented   HDL: 40  (02/27/2009)   Triglycerides: 89  (02/27/2009)  Hypertension   Last Blood Pressure: 145 / 83  (04/13/2010)   Serum creatinine: 0.6  (08/06/2009)   Serum potassium 3.4  (08/06/2009)    Hypertension flowsheet reviewed?: Yes   Progress toward BP goal: Unchanged  Self-Management Support :    Patient will work on the following items until the next clinic visit to reach self-care goals:     Eating: eat more vegetables, use fresh or frozen vegetables, eat foods that are low in salt, eat baked foods instead of fried foods  (04/13/2010)    Hypertension self-management support: Education handout, Resources for patients handout  (04/13/2010)   Hypertension education handout printed      Resource handout printed.  Process Orders Check Orders Results:     [facility]: ABN not required for this insurance Tests Sent for requisitioning (April 15, 2010 8:45 AM):     04/13/2010: [facility] -- T-CMP with Estimated GFR [[id number]] (signed)     04/13/2010: [facility] -- [facility] w/Diff [[id number]] (signed)

## 2010-10-27 NOTE — Assessment & Plan Note (Signed)
Summary: inr/pcp-pokharel/hla  Anticoagulant Therapy Managed by: Shelby Dubin, PharmD, BCPS, CPP Referring MD: Tenny Craw MD, Gunnar Fusi PCP: Jason Coop MD [facility] Attending: Rogelia Boga MD, Lanora Manis Indication 1: Deep vein thrombus Indication 2: Encounter for therapeutic drug monitoring  V58.83  Patient Assessment Reviewed by: Chancy Milroy PharmD  September 29, 2009 Medication review: verified warfarin dosage & schedule,verified previous prescription medications, verified doses & any changes, verified new medications, reviewed OTC medications, reviewed OTC health products-vitamins supplements etc Complications: none Dietary changes: none   Health status changes: yes  Details: Status post Colonoscopy secondary to determination of colon Ca as per patient.  Lifestyle changes: none   Recent/future hospitalizations: none   Recent/future procedures: yes  Details: Status Post Colonoscopy secondary to determination of colon Ca as per patient. Recent/future dental: none Patient Assessment Part 2:  Have you MISSED ANY DOSES or CHANGED TABLETS?  No missed Warfarin doses or changed tablets.  Have you had any BRUISING or BLEEDING ( nose or gum bleeds,blood in urine or stool)?  No reported bruising or bleeding in nose, gums, urine, stool.  Have you STARTED or STOPPED any MEDICATIONS, including OTC meds,herbals or supplements?  No other medications or herbal supplements were started or stopped.  Have you CHANGED your DIET, especially green vegetables,or ALCOHOL intake?  No changes in diet or alcohol intake.  Have you had any ILLNESSES or HOSPITALIZATIONS?  YES. Had surgery for colon Ca she states. States is on Lovenox for "bridge" back to therapeutic INR.  Have you had any signs of CLOTTING?(chest discomfort,dizziness,shortness of breath,arms tingling,slurred speech,swelling or redness in leg)    No chest discomfort, dizziness, shortness of breath, tingling in arm, slurred speech, swelling, or redness in  leg.     Treatment  Target INR: 2.0-3.0 INR: 1.7  Date: 09/29/2009 Regimen In:  37.50 mg INR reflects regimen in: 1.7  New  Tablet strength: : 5mg  Regimen Out:     Sunday: 1 Tablet     Monday: 1 & 1/2 Tablet     Tuesday: 1 Tablet     Wednesday: 1 & 1/2 Tablet     Thursday: 1 Tablet      Friday: 1 & 1/2 Tablet     Saturday: 1 Tablet Total Weekly: 42.5mg /week mg  Next INR Due: 10/06/2009 Adjusted by: [name]. Alexandria Lodge [name] PharmD CACP   Return to anticoagulation clinic:  10/06/2009 Time of next visit: 1500    Allergies: 1)  ! Aspirin

## 2010-10-27 NOTE — Progress Notes (Signed)
Summary: speak to nurse  Phone Note Call from Patient Call back at Home Phone [phone]   Caller: Patient Call For: Leone Payor Reason for Call: Talk to Nurse Summary of Call: Patient wants to speak to nurse regarding her appt Initial call taken by: Tawni Levy,  September 29, 2009 10:37 AM  Follow-up for Phone Call        Patient  wondered if she needed to follow up with Dr Elzie Rings after her colon resection last wek.  I adivsed her right now we don't need to see her.  Dr Leone Payor will review the notes from Dr Michaell Cowing and if we need to see her I will call her to schedule.  Patient  feeling well after her surgery.  She is due for colon recall in 07/2010 Follow-up by: Darcey Nora RN, CGRN,  September 29, 2009 11:23 AM

## 2010-10-27 NOTE — Letter (Signed)
Summary: [facility]  Dr Karie Soda' Office Visit Note   Imported By: Roderic Ovens 06/10/2010 09:59:04  _____________________________________________________________________  External Attachment:    Type:   Image     Comment:   External Document

## 2010-10-30 NOTE — Consult Note (Signed)
Summary: Consultation Report  Consultation Report   Imported By: Marylou Mccoy 12/12/2009 13:28:58  _____________________________________________________________________  External Attachment:    Type:   Image     Comment:   External Document

## 2010-10-30 NOTE — Letter (Signed)
Summary: [facility]   Imported By: Lester Lackawanna 11/24/2009 10:02:15  _____________________________________________________________________  External Attachment:    Type:   Image     Comment:   External Document

## 2010-11-04 ENCOUNTER — Encounter (INDEPENDENT_AMBULATORY_CARE_PROVIDER_SITE_OTHER): Payer: Self-pay | Admitting: *Deleted

## 2010-11-12 NOTE — Letter (Signed)
Summary: New Patient letter  [facility]  [address] [place], [place] [address]   Phone: [phone]  Fax: [phone]       11/04/2010 MRN: [id number]  Alicia Martinez [address] [place], [place]  [address]  Dear Alicia Martinez,  Welcome to the Gastroenterology Division at [facility].    You are scheduled to see Dr.  Leone Payor on 12-08-10 at 2:00p.m. on the 3rd floor at [facility], [address]  We ask that you try to arrive at our office 15 minutes prior to your appointment time to allow for check-in.  We would like you to complete the enclosed self-administered evaluation form prior to your visit and bring it with you on the day of your appointment.  We will review it with you.  Also, please bring a complete list of all your medications or, if you prefer, bring the medication bottles and we will list them.  Please bring your insurance card so that we may make a copy of it.  If your insurance requires a referral to see a specialist, please bring your referral form from your primary care physician.  Co-payments are due at the time of your visit and may be paid by cash, check or credit card.     Your office visit will consist of a consult with your physician (includes a physical exam), any laboratory testing he/she may order, scheduling of any necessary diagnostic testing (e.g. x-ray, ultrasound, CT-scan), and scheduling of a procedure (e.g. Endoscopy, Colonoscopy) if required.  Please allow enough time on your schedule to allow for any/all of these possibilities.    If you cannot keep your appointment, please call [phone] to cancel or reschedule prior to your appointment date.  This allows [place] the opportunity to schedule an appointment for another patient in need of care.  If you do not cancel or reschedule by 5 p.m. the business day prior to your appointment date, you will be charged a $50.00 late cancellation/no-show fee.    Thank you for choosing  [facility] for your medical needs.  We appreciate the opportunity to care for you.  Please visit [place] at our website  to learn more about our practice.                     Sincerely,                                                             The Gastroenterology Division

## 2010-11-27 ENCOUNTER — Encounter: Payer: Self-pay | Admitting: Internal Medicine

## 2010-12-08 ENCOUNTER — Ambulatory Visit (INDEPENDENT_AMBULATORY_CARE_PROVIDER_SITE_OTHER): Payer: BC Managed Care – PPO | Admitting: Internal Medicine

## 2010-12-08 ENCOUNTER — Telehealth: Payer: Self-pay | Admitting: *Deleted

## 2010-12-08 ENCOUNTER — Encounter: Payer: Self-pay | Admitting: Internal Medicine

## 2010-12-08 DIAGNOSIS — I82409 Acute embolism and thrombosis of unspecified deep veins of unspecified lower extremity: Secondary | ICD-10-CM

## 2010-12-08 DIAGNOSIS — Z1211 Encounter for screening for malignant neoplasm of colon: Secondary | ICD-10-CM

## 2010-12-08 DIAGNOSIS — Z7901 Long term (current) use of anticoagulants: Secondary | ICD-10-CM

## 2010-12-08 DIAGNOSIS — Z85048 Personal history of other malignant neoplasm of rectum, rectosigmoid junction, and anus: Secondary | ICD-10-CM

## 2010-12-08 NOTE — Telephone Encounter (Signed)
Call from Dr. Marvell Fuller office.  Pt is scheduled for a Colonoscopy on 12/30/2010.  Can she be off her Coumadin for 3-5 days prior or a Lovenox Bridge?  Call the office and ask for Dottie at  [phone]

## 2010-12-08 NOTE — Discharge Summary (Signed)
Alicia Martinez, Alicia Martinez           ACCOUNT NO.:  [id number]      MEDICAL RECORD NO.:  [id number]          PATIENT TYPE:  INP      LOCATION:  1529                         FACILITY:  [facility]      PHYSICIAN:  Juanetta Gosling, MDDATE OF BIRTH:  [birth year]      DATE OF ADMISSION:  10/01/2009   DATE OF DISCHARGE:  10/03/2009                                  DISCHARGE SUMMARY      ADMISSION DIAGNOSES:   1. Recurrent venous thromboembolic events.   2. Venous stasis ulcers.   3. Obstructive sleep apnea.   4. Morbid obesity.   5. Status post laparoscopic assisted low anterior resection by Dr.       Estelle Grumbles.   6. Ileus.   7. Wound infection.      DISCHARGE DIAGNOSES:   1. Recurrent venous thromboembolic events.   2. Venous stasis ulcers.   3. Obstructive sleep apnea.   4. Morbid obesity.   5. Status post laparoscopic assisted low anterior resection by Dr.       Estelle Grumbles.   6. Ileus.   7. Wound infection.      PERTINENT LABORATORY EVALUATION:  White blood cell count 13.1.      PERTINENT RADIOLOGIC EVALUATION:  Showed a CT scan    consistent with her wound infection.   There appeared to just a postop fluid collection that   may very well be simple in the pelvis.      HISTORY AND HOSPITAL COURSE:  [name] is a 55 year old morbidly   obese female who underwent a laparoscopic low anterior resection with   significant lysis of adhesions on September 22, 2009 by Dr. Karie Soda.   She was discharged home on December 30 after having bowel movements and   tolerating her diet.  She returned to the emergency room on October 01, 2009 not having had a problem in 2 days and not really passing any gas   at all, but she is tolerating liquids.  She had a large amount of   drainage from her incision inferiorly as well.  On evaluation she   appeared to have a wound infection.  I admitted her for IV antibiotics.   I opened her wound and began doing packing of that twice daily and  this   cleaned up fairly quickly.  I did obtain a CT scan to ensure that there   was no deeper collection, and it did not appear at that time that there   was an abscess or anything that needed to be addressed.  By hospital day   #2, she began passing gas.  She felt considerably better.  Her white   blood cell count had decreased to 11.  I did continue her IV antibiotics   following that.  On October 03, 2009 her ileus had resolved.  She had had   a bowel movement and was passing flatus.  Was tolerating her diet.  Her   wound had been cleaned up considerably, and I put her on Augmentin  to be   discharged home on.  She was then discharged home with home health   nursing. Follow-up was with Dr. Michaell Cowing in 1 week as well as with the   Coumadin Clinic to get her INR checked.      MEDICATIONS UPON DISCHARGE:   1. Lovenox.   2. Warfarin 5 mg daily as well as her Augmentin 875 mg b.i.d.               Juanetta Gosling, MD            MCW/MEDQ  D:  10/28/2009  T:  10/28/2009  Job:  [id number]         Electronically Signed by Emelia Loron MD on 10/29/2009 04:54:30 PM

## 2010-12-13 LAB — DIFFERENTIAL
Basophils Relative: 0 % (ref 0–1)
Eosinophils Absolute: 0.1 10*3/uL (ref 0.0–0.7)
Eosinophils Relative: 1 % (ref 0–5)
Monocytes Relative: 5 % (ref 3–12)
Neutrophils Relative %: 85 % — ABNORMAL HIGH (ref 43–77)

## 2010-12-13 LAB — URINALYSIS, ROUTINE W REFLEX MICROSCOPIC
Bilirubin Urine: NEGATIVE
Glucose, UA: NEGATIVE mg/dL
Ketones, ur: NEGATIVE mg/dL
Leukocytes, UA: NEGATIVE
Nitrite: NEGATIVE
Protein, ur: NEGATIVE mg/dL
Specific Gravity, Urine: 1.012 (ref 1.005–1.030)
Urobilinogen, UA: 1 mg/dL (ref 0.0–1.0)
pH: 7 (ref 5.0–8.0)

## 2010-12-13 LAB — CBC
HCT: 28.3 % — ABNORMAL LOW (ref 36.0–46.0)
Hemoglobin: 10.2 g/dL — ABNORMAL LOW (ref 12.0–15.0)
Hemoglobin: 9.4 g/dL — ABNORMAL LOW (ref 12.0–15.0)
MCHC: 33.3 g/dL (ref 30.0–36.0)
MCHC: 33.5 g/dL (ref 30.0–36.0)
MCV: 92.3 fL (ref 78.0–100.0)
MCV: 92.7 fL (ref 78.0–100.0)
Platelets: 431 10*3/uL — ABNORMAL HIGH (ref 150–400)
RBC: 2.97 MIL/uL — ABNORMAL LOW (ref 3.87–5.11)
RBC: 3.05 MIL/uL — ABNORMAL LOW (ref 3.87–5.11)
RBC: 3.31 MIL/uL — ABNORMAL LOW (ref 3.87–5.11)
RDW: 13 % (ref 11.5–15.5)
RDW: 13.1 % (ref 11.5–15.5)

## 2010-12-13 LAB — COMPREHENSIVE METABOLIC PANEL
ALT: 65 U/L — ABNORMAL HIGH (ref 0–35)
AST: 20 U/L (ref 0–37)
Alkaline Phosphatase: 78 U/L (ref 39–117)
CO2: 29 mEq/L (ref 19–32)
GFR calc Af Amer: >60 mL/min (ref >60)
GFR calc non Af Amer: >60 mL/min (ref >60)
Glucose, Bld: 130 mg/dL — ABNORMAL HIGH (ref 70–99)
Potassium: 3.5 mEq/L (ref 3.5–5.1)
Sodium: 134 mEq/L — ABNORMAL LOW (ref 135–145)
Total Protein: 6.9 g/dL (ref 6.0–8.3)

## 2010-12-13 LAB — URINE MICROSCOPIC-ADD ON

## 2010-12-13 LAB — PROTIME-INR
INR: 1.96 — ABNORMAL HIGH (ref 0.00–1.49)
INR: 2 — ABNORMAL HIGH (ref 0.00–1.49)
Prothrombin Time: 22.5 seconds — ABNORMAL HIGH (ref 11.6–15.2)

## 2010-12-14 ENCOUNTER — Telehealth: Payer: Self-pay | Admitting: Internal Medicine

## 2010-12-15 NOTE — Assessment & Plan Note (Signed)
Summary: DISCUSS COLON ON BT//SCH'D W/PT//MEDLIST//CX POLICY ADVISED//...   History of Present Illness Visit Type: Initial Consult Primary GI MD: Stan Head MD [facility] Primary Provider: Elyse Jarvis Requesting Provider: Jason Coop MD Chief Complaint: discuss colonoscopy on blood thinner History of Present Illness:   55 yo African-American woman s/p resection of rectal cancer in late 2010 by Dr. Michaell Cowing. Here to discuss follow-up colonoscopy. she is ok except for mild constipation and bloating. Prune juice will help. She remains on warfarn due to prior DVT's and pulmonary emboli.    GI Review of Systems    Reports abdominal pain, bloating, and  nausea.      Denies acid reflux, belching, chest pain, dysphagia with liquids, dysphagia with solids, heartburn, loss of appetite, vomiting, vomiting blood, weight loss, and  weight gain.      Reports change in bowel habits, constipation, diverticulosis, and  fecal incontinence.     Denies anal fissure, black tarry stools, diarrhea, heme positive stool, hemorrhoids, irritable bowel syndrome, jaundice, light color stool, liver problems, rectal bleeding, and  rectal pain.    Current Medications (verified): 1)  Warfarin Sodium 5 Mg Tabs (Warfarin Sodium) .... Take As Directed 2)  Hydrochlorothiazide 25 Mg Tabs (Hydrochlorothiazide) .... Take 1/2 Pill By Mouth Daily For Blood Pressure.  Allergies (verified): 1)  ! Aspirin  Past History:  Past Medical History: DVT LLE and Pulmonary embolism Sleep Apnea Urinary Tract Infection Anemia Atrial Fibrillation Infectious Colitis Diverticulitis Hypertension Obesity Rectal Cancer T1N0  Past Surgical History: Hysterectomy T1N0 rectal cancer resected 08/2009 - Dr. Michaell Cowing  Family History: Reviewed history from 07/09/2009 and no changes required. Mother had cancer unknown type cancer patient thinks bone marrow cancer. No FH of Colon Cancer: Family History of Heart Disease: sisters,  brother Family History of Clotting disorder: brother  Social History: Reviewed history from 04/13/2010 and no changes required. Occupation: Location manager single three boys Patient has quit tobacco previous 1/2 ppd habit Alcohol Use - no Daily Caffeine Use 2 per day Illicit Drug Use - no Patient does not get regular exercise.   Review of Systems       The patient complains of allergy/sinus, back pain, fatigue, itching, muscle pains/cramps, sleeping problems, swelling of feet/legs, and urine leakage.  The patient denies anemia, anxiety-new, arthritis/joint pain, blood in urine, breast changes/lumps, change in vision, confusion, cough, coughing up blood, depression-new, fainting, fever, headaches-new, hearing problems, heart murmur, heart rhythm changes, menstrual pain, night sweats, nosebleeds, pregnancy symptoms, shortness of breath, skin rash, sore throat, swollen lymph glands, thirst - excessive , urination - excessive , urination changes/pain, vision changes, and voice change.    Vital Signs:  Patient profile:   55 year old female Height:      66 inches Weight:      261 pounds BMI:     42.28 Pulse rate:   76 / minute Pulse rhythm:   regular BP sitting:   152 / 90  (left arm)  Vitals Entered By: Milford Cage NCMA (December 08, 2010 2:15 PM)  Physical Exam  General:  obese.  NAD Lungs:  Clear throughout to auscultation. Heart:  Regular rate and rhythm; no murmurs, rubs,  or bruits. Abdomen:  obese nontender   Impression & Recommendations:  Problem # 1:  PERS HX MAL NEOPLSM RECT RECTOSIGMOID JUNC&ANUS (ICD-V10.06) Assessment New  T1N0 resected 08/2009 has not had follow-up colonoscopy yet. Risks, benefits,and indications of endoscopic procedure(s) were reviewed with the patient and all questions answered.  Orders: Colonoscopy (Colon)  Problem # 2:  SCREENING, COLON CANCER (ICD-V76.51) Assessment: New  Problem # 3:  COUMADIN THERAPY (WARFARIN)  (ICD-V58.61) Assessment: Unchanged Would be best to hold at least 3 days prior to colonoscopy. will check with [facility] to obtain input and see if she needs a Lovenox bridge. Has done in past.  Problem # 4:  DEEP VENOUS THROMBOPHLEBITIS, RECURRENT (ICD-453.40) Assessment: Unchanged  Patient Instructions: 1)  You have been scheduled for a colonoscopy. Please follow written prep instructions that were given to you today at your visit. 2)  Please pick up your prescription for Moviprep at the pharmacy. An electronic presription has already been sent. 3)  We will contact your prescribing doctor regarding coming off your coumadin for your colonoscopy. 4)  The medication list was reviewed and reconciled.  All changed / newly prescribed medications were explained.  A complete medication list was provided to the patient / caregiver. Prescriptions: MOVIPREP 100 GM  SOLR (PEG-KCL-NACL-NASULF-NA ASC-C) As per prep instructions.  #1 x 0   Entered by:   Lamona Curl CMA (AAMA)   Authorized by:   Iva Boop MD, [facility]   Signed by:   Lamona Curl CMA (AAMA) on 12/08/2010   Method used:   Electronically to        [facility] (retail)       [address]       [place], [place]  [id number]       Ph: [phone]       Fax: [phone]   RxID:   [id number]

## 2010-12-15 NOTE — Letter (Addendum)
Summary: Anticoagulation Modification Letter   Gastroenterology  [address] [place], [place] [address]   Phone: [phone]  Fax: [phone]    December 08, 2010  Re:    Alicia Martinez DOB:    [birth year] MRN:    [id number]    Dear Dr Tonny Branch:  We have scheduled the above patient for an endoscopic procedure. Our records show that she is on anticoagulation therapy prescribed by yourself. Please advise as to how long the patient may come off their therapy of warfarin prior to the scheduled procedure(s) on 12/30/10. Also, if a lovenox bridge is needed, please initiate.   Please fax back/or route the completed form to Dottie at [phone].  Thank you for your help with this matter.  Sincerely,  Dottie Nelson-Smith CMA Duncan Dull)   Physician Recommendation:  Hold Coumdin 3 days prior ____________  Hold Coumadin 5 days prior ___________  Other ______________________________

## 2010-12-15 NOTE — Letter (Signed)
Summary: [facility] Instructions  [facility]  [address] [place], [place] [address]   Phone: [phone]  Fax: [phone]       HADLEI STITT    April 07, 1970    MRN: [id number]        Procedure Day Dorna Bloom: Wednesday 12/30/10     Arrival Time: 12:30 pm     Procedure Time: 1:30 pm     Location of Procedure:                    _x _   [facility] Center (4th Floor)  PREPARATION FOR COLONOSCOPY WITH MOVIPREP   Starting 5 days prior to your procedure 12/25/10 do not eat nuts, seeds, popcorn, corn, beans, peas,  salads, or any raw vegetables.  Do not take any fiber supplements (e.g. Metamucil, Citrucel, and Benefiber).  THE DAY BEFORE YOUR PROCEDURE         DATE: 12/29/10  DAY: Tuesday  1.  Drink clear liquids the entire day-NO SOLID FOOD  2.  Do not drink anything colored red or purple.  Avoid juices with pulp.  No orange juice.  3.  Drink at least 64 oz. (8 glasses) of fluid/clear liquids during the day to prevent dehydration and help the prep work efficiently.  CLEAR LIQUIDS INCLUDE: Water Jello Ice Popsicles Tea (sugar ok, no milk/cream) Powdered fruit flavored drinks Coffee (sugar ok, no milk/cream) Gatorade Juice: apple, white grape, white cranberry  Lemonade Clear bullion, consomm, broth Carbonated beverages (any kind) Strained chicken noodle soup Hard Candy                             4.  In the morning, mix first dose of MoviPrep solution:    Empty 1 Pouch A and 1 Pouch B into the disposable container    Add lukewarm drinking water to the top line of the container. Mix to dissolve    Refrigerate (mixed solution should be used within 24 hrs)  5.  Begin drinking the prep at 5:00 p.m. The MoviPrep container is divided by 4 marks.   Every 15 minutes drink the solution down to the next mark (approximately 8 oz) until the full liter is complete.   6.  Follow completed prep with 16 oz of clear liquid of your choice (Nothing red or purple).   Continue to drink clear liquids until bedtime.  7.  Before going to bed, mix second dose of MoviPrep solution:    Empty 1 Pouch A and 1 Pouch B into the disposable container    Add lukewarm drinking water to the top line of the container. Mix to dissolve    Refrigerate  THE DAY OF YOUR PROCEDURE      DATE: 12/30/10 DAY: Wednesday  Beginning at 8:30 a.m. (5 hours before procedure):         1. Every 15 minutes, drink the solution down to the next mark (approx 8 oz) until the full liter is complete.  2. Follow completed prep with 16 oz. of clear liquid of your choice.    3. You may drink clear liquids until 11:30 am (2 HOURS BEFORE PROCEDURE).   MEDICATION INSTRUCTIONS  Unless otherwise instructed, you should take regular prescription medications with a small sip of water   as early as possible the morning of your procedure.  You will be contaced by our office prior to your procedure for directions on holding your Coumadin/Warfarin.  If  you do not hear from our office 1 week prior to your scheduled procedure, please call [phone] to discuss.        OTHER INSTRUCTIONS  You will need a responsible adult at least 53 years of age to accompany you and drive you home.   This person must remain in the waiting room during your procedure.  Wear loose fitting clothing that is easily removed.  Leave jewelry and other valuables at home.  However, you may wish to bring a book to read or  an iPod/MP3 player to listen to music as you wait for your procedure to start.  Remove all body piercing jewelry and leave at home.  Total time from sign-in until discharge is approximately 2-3 hours.  You should go home directly after your procedure and rest.  You can resume normal activities the  day after your procedure.  The day of your procedure you should not:   Drive   Make legal decisions   Operate machinery   Drink alcohol   Return to work  You will receive specific  instructions about eating, activities and medications before you leave.    The above instructions have been reviewed and explained to me by   _______________________    I fully understand and can verbalize these instructions _____________________________ Date _________

## 2010-12-22 NOTE — Telephone Encounter (Signed)
Called back at Dr. Marvell Fuller office. Patient can stop taking coumadin from 12/26/10 but she needs to be restarted back on coumadin on the same day after her procedure.

## 2010-12-24 NOTE — Progress Notes (Signed)
Summary: Hold Coumadin for procedure  Phone Note From Other Clinic   Caller: Dr.Pribula Summary of Call: Dr Donnelly Stager office called to say that it would be fine for patient to hold coumadin 5 days prior to her colonoscopy. She will need to discontinue coumadin beginning 12/26/10 and will be given further instructions after procedure. Dr states that she will append Centricity note in EMR to verify that she is okay with patient holding coumadin. Initial call taken by: Lamona Curl CMA Duncan Dull),  December 14, 2010 2:34 PM  Follow-up for Phone Call        I have spoken to patient to advise her to hold her coumadin beginning 12/26/10 in preparation for her colonoscopy with Dr Leone Payor as per Dr Donnelly Stager office recommendations. Patient verbalizes understanding. Follow-up by: Lamona Curl CMA Duncan Dull),  December 14, 2010 2:39 PM  Additional Follow-up for Phone Call Additional follow up Details #1::        The caller was Dr. Dorthula Rue and not Dr.Pribula. It's fine with me to hold coumadin from 12/26/10 but resume coumadin back on the same day of procedure.(12/30/10)

## 2010-12-25 ENCOUNTER — Telehealth: Payer: Self-pay | Admitting: *Deleted

## 2010-12-25 ENCOUNTER — Telehealth: Payer: Self-pay | Admitting: Internal Medicine

## 2010-12-25 ENCOUNTER — Other Ambulatory Visit (INDEPENDENT_AMBULATORY_CARE_PROVIDER_SITE_OTHER): Payer: BC Managed Care – PPO

## 2010-12-25 DIAGNOSIS — Z86718 Personal history of other venous thrombosis and embolism: Secondary | ICD-10-CM

## 2010-12-25 DIAGNOSIS — Z7901 Long term (current) use of anticoagulants: Secondary | ICD-10-CM

## 2010-12-25 DIAGNOSIS — I82409 Acute embolism and thrombosis of unspecified deep veins of unspecified lower extremity: Secondary | ICD-10-CM

## 2010-12-25 DIAGNOSIS — Z9229 Personal history of other drug therapy: Secondary | ICD-10-CM

## 2010-12-25 NOTE — Telephone Encounter (Signed)
LMOM for pt to return our call. Per our lab, her PT/INR was 5.9. Per Dr Leone Payor, hold Coumadin today as well as Saturday and Sunday and repeat PT/INR on Monday.

## 2010-12-25 NOTE — Telephone Encounter (Signed)
Pt called in to ask about stopping her Coumadin prior to her COLON on 12/30/10. Per notes from Dr Dorthula Rue, pt is to stop the Coumadin on 12/26/10, but there is no mention of a Lovenox bridge- pt had asked about the Lovenox when I spoke to her. LMOM at [phone] for someone at Dr Jory Ee office to call back. Per Dr Leone Payor, have pt come in today for PT/INR and he will f/u with pt this weekend, if not today. Pt stated understanding.  [phone]

## 2010-12-25 NOTE — Telephone Encounter (Signed)
Message copied by Graciella Freer on Fri Dec 25, 2010  5:01 PM ------      Message from: Stan Head      Created: Fri Dec 25, 2010  4:32 PM       We will call her to hold warfarin and recheck INR on Monday 4/2

## 2010-12-25 NOTE — Progress Notes (Signed)
Quick Note:  We will call her to hold warfarin and recheck INR on Monday 4/2 ______

## 2010-12-25 NOTE — Telephone Encounter (Signed)
Dr Gwinda Passe called back to state she thought it would be safe for pt to come off Coumadin for 3-5 days. I informed her pt had called and asked me about it so I was checking to make sure. Dr Gwinda Passe stated we may call her for any concerns. [phone]

## 2010-12-28 ENCOUNTER — Other Ambulatory Visit (INDEPENDENT_AMBULATORY_CARE_PROVIDER_SITE_OTHER): Payer: BC Managed Care – PPO

## 2010-12-28 ENCOUNTER — Telehealth: Payer: Self-pay | Admitting: *Deleted

## 2010-12-28 DIAGNOSIS — Z7901 Long term (current) use of anticoagulants: Secondary | ICD-10-CM

## 2010-12-28 DIAGNOSIS — Z86718 Personal history of other venous thrombosis and embolism: Secondary | ICD-10-CM

## 2010-12-28 DIAGNOSIS — Z9229 Personal history of other drug therapy: Secondary | ICD-10-CM

## 2010-12-28 LAB — PROTIME-INR
INR: 0.98 (ref 0.00–1.49)
Prothrombin Time: 12.9 seconds (ref 11.6–15.2)
Prothrombin Time: 13.4 seconds (ref 11.6–15.2)

## 2010-12-28 LAB — CREATININE, SERUM
Creatinine, Ser: 0.48 mg/dL (ref 0.4–1.2)
Creatinine, Ser: 0.52 mg/dL (ref 0.4–1.2)
GFR calc Af Amer: >60 mL/min (ref >60)
GFR calc Af Amer: >60 mL/min (ref >60)
GFR calc non Af Amer: >60 mL/min (ref >60)

## 2010-12-28 LAB — CBC
HCT: 31.7 % — ABNORMAL LOW (ref 36.0–46.0)
HCT: 37.1 % (ref 36.0–46.0)
Hemoglobin: 12.6 g/dL (ref 12.0–15.0)
Hemoglobin: 9.7 g/dL — ABNORMAL LOW (ref 12.0–15.0)
MCHC: 33.6 g/dL (ref 30.0–36.0)
MCHC: 33.7 g/dL (ref 30.0–36.0)
MCHC: 33.9 g/dL (ref 30.0–36.0)
MCV: 93.3 fL (ref 78.0–100.0)
MCV: 93.8 fL (ref 78.0–100.0)
MCV: 94.4 fL (ref 78.0–100.0)
Platelets: 229 10*3/uL (ref 150–400)
Platelets: 262 10*3/uL (ref 150–400)
RBC: 2.9 MIL/uL — ABNORMAL LOW (ref 3.87–5.11)
RBC: 3.07 MIL/uL — ABNORMAL LOW (ref 3.87–5.11)
RBC: 3.98 MIL/uL (ref 3.87–5.11)
RDW: 13 % (ref 11.5–15.5)
WBC: 4.6 10*3/uL (ref 4.0–10.5)
WBC: 9.5 10*3/uL (ref 4.0–10.5)

## 2010-12-28 LAB — POTASSIUM: Potassium: 3.8 mEq/L (ref 3.5–5.1)

## 2010-12-28 LAB — APTT: aPTT: 25 seconds (ref 24–37)

## 2010-12-28 NOTE — Telephone Encounter (Signed)
I spoke to her INR is 1.8 She will take warfarin 5 mg nightly tonight and tomorrow night and have her colonoscopy on warfarin Further plans pending that I also explained that she will need to have regular INR checks going forward (through PCP)  Lab Results  Component Value Date   INR 1.8* 12/28/2010   INR 5.9* 12/25/2010   INR 2.3 10/13/2009

## 2010-12-28 NOTE — Telephone Encounter (Signed)
PT/INR results to Dr Leone Payor.

## 2010-12-29 ENCOUNTER — Encounter: Payer: Self-pay | Admitting: Internal Medicine

## 2010-12-30 ENCOUNTER — Encounter: Payer: Self-pay | Admitting: Internal Medicine

## 2010-12-30 ENCOUNTER — Ambulatory Visit (AMBULATORY_SURGERY_CENTER): Payer: BC Managed Care – PPO | Admitting: Internal Medicine

## 2010-12-30 VITALS — BP 145/75 | HR 69 | Temp 97.6°F | Resp 13 | Ht 66.0 in | Wt 250.0 lb

## 2010-12-30 DIAGNOSIS — R933 Abnormal findings on diagnostic imaging of other parts of digestive tract: Secondary | ICD-10-CM

## 2010-12-30 DIAGNOSIS — K621 Rectal polyp: Secondary | ICD-10-CM

## 2010-12-30 DIAGNOSIS — Z1211 Encounter for screening for malignant neoplasm of colon: Secondary | ICD-10-CM

## 2010-12-30 DIAGNOSIS — D126 Benign neoplasm of colon, unspecified: Secondary | ICD-10-CM

## 2010-12-30 DIAGNOSIS — D128 Benign neoplasm of rectum: Secondary | ICD-10-CM

## 2010-12-30 DIAGNOSIS — Z85048 Personal history of other malignant neoplasm of rectum, rectosigmoid junction, and anus: Secondary | ICD-10-CM

## 2010-12-30 DIAGNOSIS — K5730 Diverticulosis of large intestine without perforation or abscess without bleeding: Secondary | ICD-10-CM

## 2010-12-30 DIAGNOSIS — Z85038 Personal history of other malignant neoplasm of large intestine: Secondary | ICD-10-CM

## 2010-12-30 MED ORDER — SODIUM CHLORIDE 0.9 % IV SOLN: 500.0000 mL | INTRAVENOUS | Status: DC

## 2010-12-30 NOTE — Patient Instructions (Addendum)
1) Take 5 mg warfarin tonight and then 7.5 mg tomorrow night and go back to alternating 5 and 7.5 mg dosing schedule every other day. 2) call your primary clinic and get seen by next week for your blood clotting (INR check). You need to have regular visits for this. 3) We will notify you about the results but it may be after next week that you get notified.  Please read over discharge instructions given. Pt education given on polyps,diverticulosis, and high fiber diet. Take all medications as directed.

## 2010-12-31 ENCOUNTER — Telehealth: Payer: Self-pay | Admitting: *Deleted

## 2010-12-31 ENCOUNTER — Telehealth: Payer: Self-pay | Admitting: Internal Medicine

## 2010-12-31 NOTE — Telephone Encounter (Signed)
Left message on identified home phone and attempted to call cell phone with no ringing or answer.

## 2011-01-02 LAB — CBC
Hemoglobin: 13.3 g/dL (ref 12.0–15.0)
RBC: 4.35 MIL/uL (ref 3.87–5.11)
WBC: 7.2 10*3/uL (ref 4.0–10.5)

## 2011-01-02 LAB — URINALYSIS, ROUTINE W REFLEX MICROSCOPIC
Leukocytes, UA: NEGATIVE
Nitrite: NEGATIVE
Protein, ur: NEGATIVE mg/dL
Urobilinogen, UA: 0.2 mg/dL (ref 0.0–1.0)

## 2011-01-02 LAB — WOUND CULTURE

## 2011-01-02 LAB — CULTURE, BLOOD (ROUTINE X 2)

## 2011-01-02 LAB — DIFFERENTIAL
Lymphocytes Relative: 12 % (ref 12–46)
Lymphs Abs: 0.8 10*3/uL (ref 0.7–4.0)
Monocytes Absolute: 0.6 10*3/uL (ref 0.1–1.0)
Monocytes Relative: 8 % (ref 3–12)
Neutro Abs: 5.8 10*3/uL (ref 1.7–7.7)
Neutrophils Relative %: 80 % — ABNORMAL HIGH (ref 43–77)

## 2011-01-02 LAB — POCT I-STAT, CHEM 8
BUN: 6 mg/dL (ref 6–23)
HCT: 39 % (ref 36.0–46.0)
Hemoglobin: 13.3 g/dL (ref 12.0–15.0)
Sodium: 136 mEq/L (ref 135–145)
TCO2: 21 mmol/L (ref 0–100)

## 2011-01-02 LAB — URINE MICROSCOPIC-ADD ON

## 2011-01-02 LAB — PROTIME-INR: INR: 1.3 (ref 0.00–1.49)

## 2011-01-02 LAB — APTT: aPTT: 27 seconds (ref 24–37)

## 2011-01-04 ENCOUNTER — Ambulatory Visit (INDEPENDENT_AMBULATORY_CARE_PROVIDER_SITE_OTHER): Payer: BC Managed Care – PPO | Admitting: Pharmacist

## 2011-01-04 DIAGNOSIS — Z7901 Long term (current) use of anticoagulants: Secondary | ICD-10-CM

## 2011-01-04 DIAGNOSIS — I82409 Acute embolism and thrombosis of unspecified deep veins of unspecified lower extremity: Secondary | ICD-10-CM

## 2011-01-04 NOTE — Progress Notes (Signed)
Anti-Coagulation Progress Note  Alicia Martinez is a 55 y.o. female who is currently on an anti-coagulation regimen.    RECENT RESULTS: Recent results are below, the most recent result is correlated with a dose of 40 mg. per week: Lab Results  Component Value Date   INR 1.8 01/04/2011   INR 1.8* 12/28/2010   INR 5.9* 12/25/2010    ANTI-COAG DOSE:   Latest dosing instructions   Total Sun Mon Tue Wed Thu Fri Sat   47.5 7.5 mg 5 mg 7.5 mg 5 mg 7.5 mg 5 mg 7.5 mg    (5 mg1.5) (5 mg1) (5 mg1.5) (5 mg1) (5 mg1.5) (5 mg1) (5 mg1.5)    Addl Tabs   2.5 mg          (2.5 mg1)          ANTICOAG SUMMARY: Anticoagulation Episode Summary              Current INR goal 2.0-3.0 Next INR check 01/18/2011   INR from last check 1.8! (01/04/2011)     Weekly max dose (mg)  Target end date Indefinite   Indications DEEP VENOUS THROMBOPHLEBITIS, RECURRENT, Long term current use of anticoagulant   INR check [facility]    Send INR reminders to [facility]   Comments        Provider Role Specialty Phone number   Blanch Media  Internal Medicine [phone]        ANTICOAG TODAY: Anticoagulation Summary as of 01/04/2011              INR goal 2.0-3.0     Selected INR 1.8! (01/04/2011) Next INR check 01/18/2011   Weekly max dose (mg)  Target end date Indefinite   Indications DEEP VENOUS THROMBOPHLEBITIS, RECURRENT, Long term current use of anticoagulant    Anticoagulation Episode Summary              INR check location [facility] Preferred lab    Send INR reminders to [facility]   Comments        Provider Role Specialty Phone number   Blanch Media  Internal Medicine [phone]        PATIENT INSTRUCTIONS: Patient Instructions  Patient instructed to take medications as defined in the Anti-coagulation Track section of this encounter.  Patient instructed to TAKE today's dose.  Patient verbalized understanding of these instructions.        FOLLOW-UP Return in 2 weeks (on 01/18/2011) for Follow up INR.  [name], [name] Pharm.D., CACP

## 2011-01-04 NOTE — Patient Instructions (Signed)
Patient instructed to take medications as defined in the Anti-coagulation Track section of this encounter.  Patient instructed to TAKE today's dose.  Patient verbalized understanding of these instructions.     

## 2011-01-11 ENCOUNTER — Encounter: Payer: Self-pay | Admitting: Internal Medicine

## 2011-01-11 NOTE — Progress Notes (Signed)
Quick Note:  Colon recall 3 years 12/2011  Hx T1n0 rectal cancer 2010 Lymphoid tissue in appendix, hyperplastic rectal polyp this time ______

## 2011-01-16 ENCOUNTER — Emergency Department (HOSPITAL_COMMUNITY): Payer: BC Managed Care – PPO

## 2011-01-16 ENCOUNTER — Emergency Department (HOSPITAL_COMMUNITY)
Admission: EM | Admit: 2011-01-16 | Discharge: 2011-01-17 | Disposition: A | Discharge status: Home / Self Care | Payer: BC Managed Care – PPO | Attending: Emergency Medicine | Admitting: Emergency Medicine

## 2011-01-16 DIAGNOSIS — Z85038 Personal history of other malignant neoplasm of large intestine: Secondary | ICD-10-CM | POA: Insufficient documentation

## 2011-01-16 DIAGNOSIS — R1909 Other intra-abdominal and pelvic swelling, mass and lump: Secondary | ICD-10-CM | POA: Insufficient documentation

## 2011-01-16 DIAGNOSIS — Z7901 Long term (current) use of anticoagulants: Secondary | ICD-10-CM | POA: Insufficient documentation

## 2011-01-16 DIAGNOSIS — R1032 Left lower quadrant pain: Secondary | ICD-10-CM | POA: Insufficient documentation

## 2011-01-16 DIAGNOSIS — K921 Melena: Secondary | ICD-10-CM | POA: Insufficient documentation

## 2011-01-16 DIAGNOSIS — M549 Dorsalgia, unspecified: Secondary | ICD-10-CM | POA: Insufficient documentation

## 2011-01-16 DIAGNOSIS — Z86718 Personal history of other venous thrombosis and embolism: Secondary | ICD-10-CM | POA: Insufficient documentation

## 2011-01-16 DIAGNOSIS — Z79899 Other long term (current) drug therapy: Secondary | ICD-10-CM | POA: Insufficient documentation

## 2011-01-16 LAB — COMPREHENSIVE METABOLIC PANEL
Albumin: 3.1 g/dL — ABNORMAL LOW (ref 3.5–5.2)
Alkaline Phosphatase: 73 U/L (ref 39–117)
BUN: 9 mg/dL (ref 6–23)
Calcium: 8.5 mg/dL (ref 8.4–10.5)
Creatinine, Ser: 0.62 mg/dL (ref 0.4–1.2)
Glucose, Bld: 117 mg/dL — ABNORMAL HIGH (ref 70–99)
Potassium: 3.6 mEq/L (ref 3.5–5.1)
Total Protein: 6.4 g/dL (ref 6.0–8.3)

## 2011-01-16 LAB — URINE MICROSCOPIC-ADD ON

## 2011-01-16 LAB — URINALYSIS, ROUTINE W REFLEX MICROSCOPIC
Bilirubin Urine: NEGATIVE
Nitrite: NEGATIVE
Specific Gravity, Urine: 1.028 (ref 1.005–1.030)
pH: 6 (ref 5.0–8.0)

## 2011-01-16 LAB — DIFFERENTIAL
Basophils Absolute: 0 10*3/uL (ref 0.0–0.1)
Basophils Relative: 0 % (ref 0–1)
Eosinophils Absolute: 0.2 10*3/uL (ref 0.0–0.7)
Monocytes Absolute: 0.4 10*3/uL (ref 0.1–1.0)
Neutro Abs: 3 10*3/uL (ref 1.7–7.7)
Neutrophils Relative %: 52 % (ref 43–77)

## 2011-01-16 LAB — PROTIME-INR
INR: 3.15 — ABNORMAL HIGH (ref 0.00–1.49)
Prothrombin Time: 32.4 seconds — ABNORMAL HIGH (ref 11.6–15.2)

## 2011-01-16 LAB — CBC
Hemoglobin: 11.9 g/dL — ABNORMAL LOW (ref 12.0–15.0)
MCHC: 33.5 g/dL (ref 30.0–36.0)
Platelets: 257 10*3/uL (ref 150–400)

## 2011-01-16 MED ORDER — IOHEXOL 300 MG/ML  SOLN
100.0000 mL | Freq: Once | INTRAMUSCULAR | Status: AC | PRN
  Administered 2011-01-16: 100 mL via INTRAVENOUS

## 2011-01-17 ENCOUNTER — Telehealth: Payer: Self-pay | Admitting: Internal Medicine

## 2011-01-17 NOTE — Telephone Encounter (Signed)
Call received from ER about the patient presenting with lower abdominal pain. Pain was well controlled by the pain meds she received in the ER. The CT scan findings as noted:   IMPRESSION:   1.  New mildly heterogeneous mass arising at the left adnexa,   measuring 6.6 x 4.6 cm.  Suggest clinical correlation for prior   oophorectomy, and pelvic ultrasound as deemed clinically   appropriate.   2.  Several poorly defined areas of soft tissue density noted in   the region of the rectosigmoid junction.  These are more prominent   than on prior studies and raise concern for recurrent metastatic   disease.  On the right, the soft tissue measures 6.2 x 2.3 cm, and   there is vague nodularity about the anastomosis, measuring 5.9 x   4.1 cm.  PET/CT would be helpful for further evaluation.   3.  3.6 cm cystic mass at the inferior tip of the liver likely   reflects a slowly growing cyst, though correlation with LFTs would   Patient wanting to go home with a follow up in clinic. She was advised to follow up with her cancer doctor and out patient clinics. Patient will call up on Monday for an appointment.   be helpful.

## 2011-01-18 ENCOUNTER — Ambulatory Visit (INDEPENDENT_AMBULATORY_CARE_PROVIDER_SITE_OTHER): Payer: BC Managed Care – PPO | Admitting: Pharmacist

## 2011-01-18 ENCOUNTER — Ambulatory Visit (INDEPENDENT_AMBULATORY_CARE_PROVIDER_SITE_OTHER): Payer: BC Managed Care – PPO | Admitting: Ophthalmology

## 2011-01-18 DIAGNOSIS — Z7901 Long term (current) use of anticoagulants: Secondary | ICD-10-CM

## 2011-01-18 DIAGNOSIS — I82409 Acute embolism and thrombosis of unspecified deep veins of unspecified lower extremity: Secondary | ICD-10-CM

## 2011-01-18 DIAGNOSIS — R109 Unspecified abdominal pain: Secondary | ICD-10-CM | POA: Insufficient documentation

## 2011-01-18 DIAGNOSIS — I10 Essential (primary) hypertension: Secondary | ICD-10-CM

## 2011-01-18 DIAGNOSIS — M549 Dorsalgia, unspecified: Secondary | ICD-10-CM

## 2011-01-18 DIAGNOSIS — N9489 Other specified conditions associated with female genital organs and menstrual cycle: Secondary | ICD-10-CM | POA: Insufficient documentation

## 2011-01-18 DIAGNOSIS — K6389 Other specified diseases of intestine: Secondary | ICD-10-CM | POA: Insufficient documentation

## 2011-01-18 MED ORDER — HYDROCHLOROTHIAZIDE 25 MG PO TABS: 25.0000 mg | ORAL_TABLET | Freq: Every day | ORAL | Status: DC

## 2011-01-18 MED ORDER — POLYETHYLENE GLYCOL 3350 17 GM/SCOOP PO POWD: 17.0000 g | Freq: Every day | ORAL | Status: AC

## 2011-01-18 MED ORDER — HYDROCODONE-ACETAMINOPHEN 7.5-500 MG PO TABS: 1.0000 | ORAL_TABLET | Freq: Four times a day (QID) | ORAL | Status: AC | PRN

## 2011-01-18 NOTE — Assessment & Plan Note (Signed)
The patient currently has a 6 x 5 cm left adnexal mass per her CT on 4/22.  Per radiology's recommendation, I will work this up with transvaginal ultrasound. I will also check a CEA 125 given concern of metastatic disease from the ovary.

## 2011-01-18 NOTE — Assessment & Plan Note (Addendum)
This is been chronic, and the patient has diffuse musculoskeletal paraspinal muscle pain. There is no spinous process tenderness. There is no significant decrease in range of motion. I will consider physical therapy in the future, but would like to focus today's visit on workup the patient's possible metastatic disease.

## 2011-01-18 NOTE — Assessment & Plan Note (Signed)
The patients blood pressure was within reasonable control today (BP: 135/79 mmHg ) and I will not make any adjustments to the patients anti-hypertensive regimen. I will continue to monitor and titrate the patients medications as needed at future visits.

## 2011-01-18 NOTE — Patient Instructions (Signed)
Patient instructed to take medications as defined in the Anti-coagulation Track section of this encounter.  Patient instructed to OMIT today's dose.  Patient verbalized understanding of these instructions.    

## 2011-01-18 NOTE — Assessment & Plan Note (Addendum)
The patient has been afebrile, without chills. The patient's pain is dull, moderate in intensity and diffuse. This could be related to metastatic disease, so treated aggressively for now and increase the patient's Vicodin dose. The patient does not currently have an acute abdomen. I did inform the patient that she should return to the clinic or call the ED if her symptoms worsen, or she develops nausea or vomiting.  The patient also has a small cystic lesion on her liver, though her last CMET was within normal limits, and I doubt that this represents a metastasis.

## 2011-01-18 NOTE — Progress Notes (Signed)
Subjective:    Patient ID: Alicia Martinez, female    DOB: 08-22-1956, 55 y.o.   MRN: [id number]  HPI  This is a 55 year old female with past medical history significant for T1 N0 rectal cancer resected in 08/2009, hypertension, and deep vein thrombophlebitis, who presents as ED followup after having been seen on 4/22. The patient states that she developed left lower abdominal pain on 4/21 which was 8/10 in intensity. The patient was given Vicodin which has not helped her symptoms. The patient denies any nausea or vomiting, but CT scan demonstrated a heterogeneous mass on the left adnexa, as well as several tissue densities at the rectosigmoid junction concerning for recurrent disease. Of note, the patient also recently had a colonoscopy performed on April 4 which only demonstrated 2 rectal polyps and one sessile polyp as well as diverticuli, or polyps were hyperplastic. With regards to the patient's abdominal pain, it is continued, it is diffuse, and the patient feels that she's had increased abdominal swelling. The patient also complains of back pain which has been going on for years, SI joint x-rays have been done in the past were normal, and the patient has only been treated with Tylenol therapy. The patient denies any changes in her bowel or bladder habits, fevers, chills, nausea or vomiting.  The patient is status post hysterectomy for uterine fibroids, but still has her ovaries. The patient has not had a Pap smear since her hysterectomy. The patient did have a mammogram performed in July of 2011 which was normal.   Review of Systems  Constitutional: Negative for fever and chills.  Respiratory: Negative for cough and shortness of breath.   Cardiovascular: Negative for chest pain and palpitations.  Gastrointestinal: Negative for vomiting, diarrhea and constipation.       Objective:   Physical Exam  Constitutional: She appears well-developed and well-nourished.  HENT:  Head: Normocephalic  and atraumatic.  Eyes: Pupils are equal, round, and reactive to light.  Cardiovascular: Normal rate, regular rhythm and intact distal pulses.  Exam reveals no gallop and no friction rub.   No murmur heard. Pulmonary/Chest: Effort normal. She has no wheezes. She has no rales.  Abdominal: Soft. Bowel sounds are normal. She exhibits no distension.       There is mild abdominal tenderness throughout the entire abdomen, no masses were felt.  Musculoskeletal: Normal range of motion.       There is diffuse musculoskeletal paraspinal muscle tenderness particularly near the SI joint bilaterally. There is no decreased range of motion. Lower extremity strength is normal as is the straight leg test.  Neurological: She is alert. No cranial nerve deficit.  Skin: No rash noted.        Current Outpatient Prescriptions on File Prior to Visit  Medication Sig Dispense Refill  . hydrochlorothiazide 25 MG tablet 25 mg. Take half pill by mouth daily for blood pressure       . warfarin (COUMADIN) 5 MG tablet Take 5 mg by mouth as directed.         Current Facility-Administered Medications on File Prior to Visit  Medication Dose Route Frequency Provider Last Rate Last Dose  . 0.9 %  sodium chloride infusion  500 mL Intravenous Continuous Iva Boop, MD        Past Medical History  Diagnosis Date  . Sleep apnea   . Deep vein thrombosis     left lower extremity  . Pulmonary embolism   . Allergy   . Anemia   .  Hypertension   . History of rectal cancer     T1N0 resected 08/2009  . Diverticulitis large intestine   . Obesity   . Infectious colitis      Assessment & Plan:

## 2011-01-18 NOTE — Progress Notes (Signed)
Anti-Coagulation Progress Note  Alicia Martinez is a 55 y.o. female who is currently on an anti-coagulation regimen.    RECENT RESULTS: Recent results are below, the most recent result is correlated with a dose of 45 mg. per week: Lab Results  Component Value Date   INR 4.3 01/18/2011   INR 3.15* 01/16/2011   INR 1.8 01/04/2011    ANTI-COAG DOSE:   Latest dosing instructions   Total Sun Mon Tue Wed Thu Fri Sat   40 7.5 mg 5 mg 5 mg 5 mg 7.5 mg 5 mg 5 mg    (5 mg1.5) (5 mg1) (5 mg1) (5 mg1) (5 mg1.5) (5 mg1) (5 mg1)         ANTICOAG SUMMARY: Anticoagulation Episode Summary              Current INR goal 2.0-3.0 Next INR check 02/08/2011   INR from last check 4.3! (01/18/2011)     Weekly max dose (mg)  Target end date Indefinite   Indications DEEP VENOUS THROMBOPHLEBITIS, RECURRENT, Long term current use of anticoagulant   [facility] check [facility]    Send INR reminders to [facility]   Comments        Provider Role Specialty Phone number   Blanch Media  Internal Medicine [phone]        ANTICOAG TODAY: Anticoagulation Summary as of 01/18/2011              INR goal 2.0-3.0     Selected INR 4.3! (01/18/2011) Next INR check 02/08/2011   Weekly max dose (mg)  Target end date Indefinite   Indications DEEP VENOUS THROMBOPHLEBITIS, RECURRENT, Long term current use of anticoagulant    Anticoagulation Episode Summary              INR check location [facility] Preferred lab    Send INR reminders to [facility]   Comments        Provider Role Specialty Phone number   Blanch Media  Internal Medicine [phone]        PATIENT INSTRUCTIONS: Patient Instructions  Patient instructed to take medications as defined in the Anti-coagulation Track section of this encounter.  Patient instructed to OMIT today's dose.  Patient verbalized understanding of these instructions.        FOLLOW-UP Return in 3 weeks (on 02/08/2011) for  Follow up INR.  [name], [name] Pharm.D., CACP

## 2011-01-18 NOTE — Assessment & Plan Note (Addendum)
The patient was seen by Dr. Alexandria Lodge today, will defer further anticoagulation management to him.

## 2011-01-18 NOTE — Assessment & Plan Note (Signed)
The patient has several tissue densities at the rectosigmoid junction near the anastomosis site, concerning for recurrent disease. This could also however represent metastatic ovarian disease given her concurrent adnexal mass. I will order a PET scan for further evaluation per radiology's recommendations. I am also referring the patient back to her surgeon Dr. Michaell Cowing.

## 2011-01-18 NOTE — Patient Instructions (Signed)
I have ordered several studies to workup the findings on CT scan. I also increased her Vicodin to try to help with her abdominal pain. I would like to see you again soon after you have your studies performed to review the results. You will be seen by Dr. Michaell Cowing on May 8.

## 2011-01-19 LAB — CA 125: CA 125: 2.7 U/mL (ref 0.0–30.2)

## 2011-01-19 MED ORDER — HYDROCHLOROTHIAZIDE 25 MG PO TABS: 12.5000 mg | ORAL_TABLET | Freq: Every day | ORAL | Status: DC

## 2011-01-19 NOTE — Progress Notes (Signed)
Addended by: Sinda Du on: 01/19/2011 08:46 AM   Modules accepted: Orders

## 2011-01-19 NOTE — Telephone Encounter (Signed)
Pt seen in clinic on 4/23

## 2011-01-26 ENCOUNTER — Encounter (HOSPITAL_COMMUNITY)
Admission: RE | Admit: 2011-01-26 | Discharge: 2011-01-26 | Disposition: A | Discharge status: Home / Self Care | Payer: BC Managed Care – PPO | Source: Ambulatory Visit | Attending: Internal Medicine | Admitting: Internal Medicine

## 2011-01-26 ENCOUNTER — Encounter (HOSPITAL_COMMUNITY): Payer: Self-pay

## 2011-01-26 ENCOUNTER — Other Ambulatory Visit: Payer: Self-pay | Admitting: Ophthalmology

## 2011-01-26 DIAGNOSIS — K6389 Other specified diseases of intestine: Secondary | ICD-10-CM

## 2011-01-26 DIAGNOSIS — N839 Noninflammatory disorder of ovary, fallopian tube and broad ligament, unspecified: Secondary | ICD-10-CM | POA: Insufficient documentation

## 2011-01-26 DIAGNOSIS — C189 Malignant neoplasm of colon, unspecified: Secondary | ICD-10-CM | POA: Insufficient documentation

## 2011-01-26 DIAGNOSIS — Z90710 Acquired absence of both cervix and uterus: Secondary | ICD-10-CM | POA: Insufficient documentation

## 2011-01-26 DIAGNOSIS — N9489 Other specified conditions associated with female genital organs and menstrual cycle: Secondary | ICD-10-CM

## 2011-01-26 HISTORY — DX: Malignant (primary) neoplasm, unspecified: C80.1

## 2011-01-26 LAB — GLUCOSE, CAPILLARY: Glucose-Capillary: 112 mg/dL — ABNORMAL HIGH (ref 70–99)

## 2011-01-26 MED ORDER — FLUDEOXYGLUCOSE F - 18 (FDG) INJECTION
14.3000 | Freq: Once | INTRAVENOUS | Status: AC | PRN
  Administered 2011-01-26: 14.3 via INTRAVENOUS

## 2011-01-27 ENCOUNTER — Telehealth: Payer: Self-pay | Admitting: Ophthalmology

## 2011-01-27 ENCOUNTER — Telehealth: Payer: Self-pay | Admitting: *Deleted

## 2011-01-27 NOTE — Telephone Encounter (Signed)
Call from pt requesting results of her U/S.  Pt requested an appointment for this pm to discuss results.  Pt was informed that there are no available appointments today.  Will get a message to Dr. Cathey Endow about calling patient back with results at [phone]. Pt would like to be called before 2:30 PM.

## 2011-01-27 NOTE — Telephone Encounter (Signed)
I attempted to contact the patient at all the numbers provided to the clinic, with no answer, all continue to try to contact the patient regarding the results of her studies. Today, the PET scan appears to be negative, her CEA 125 is normal, and her ultrasound continues to be pending.

## 2011-01-28 ENCOUNTER — Telehealth: Payer: Self-pay | Admitting: Ophthalmology

## 2011-01-28 ENCOUNTER — Telehealth: Payer: Self-pay | Admitting: *Deleted

## 2011-01-28 DIAGNOSIS — N83209 Unspecified ovarian cyst, unspecified side: Secondary | ICD-10-CM

## 2011-01-28 NOTE — Progress Notes (Signed)
Quick Note:  It does not appear that the patient has had a recurrence of her rectal cancer. I will still have her follow up with her surgeon for further evaluation. ______

## 2011-01-28 NOTE — Telephone Encounter (Signed)
Pt states please use the 299 #, she will be available at the # until 1430, i read her your note. She is also requesting a pain med, mid back pain scale 7/10, constant, sharp, burning. Bloated feeling. Please advise. Also [phone] ph after 1430.

## 2011-01-28 NOTE — Telephone Encounter (Signed)
I spoke with the patient- she was informed that I will be calling in for tramadol for her back pain at [facility] , [facility]. She was advised to call back  If her pain does not get better in 1 week.

## 2011-01-28 NOTE — Telephone Encounter (Signed)
I continue to be unable to contact the patient though I have tried all of her phone numbers.  I will have the pt keep her appointment with her surgeon for further evaluation of her tissue around her anastomosis and refer to OB for her hemorraghic cysts. The patient will also need repeat ultrasound in 8 weeks.

## 2011-01-28 NOTE — Progress Notes (Signed)
Quick Note:  I will refer the patient to [facility] for further evaluation. ______

## 2011-02-01 ENCOUNTER — Telehealth: Payer: Self-pay | Admitting: *Deleted

## 2011-02-01 NOTE — Telephone Encounter (Signed)
Pt calls and wants to discuss recent results w/ md. You may call her at [phone]

## 2011-02-01 NOTE — Telephone Encounter (Signed)
Patient was called and her test results were explained- she was notified about bilateral complex ovarian cyst that needs a follow up in 8 weeks. She was told that her PET scan did not show any evidence for recurrent colon cancer.

## 2011-02-02 ENCOUNTER — Telehealth: Payer: Self-pay | Admitting: Internal Medicine

## 2011-02-02 NOTE — Telephone Encounter (Signed)
She was sent to Dr. Michaell Cowing by PCP He was asking me about abnormal appendix area at colonoscopy but that was lymphoid tissue She was referred due to pain and mass-like lesions around colonic anastomosis and abdominal pain I did not think appendectomy was absolutely indicated but if she has a diagnostic laparoscopy that would be reasonable.

## 2011-02-08 ENCOUNTER — Ambulatory Visit (INDEPENDENT_AMBULATORY_CARE_PROVIDER_SITE_OTHER): Payer: BC Managed Care – PPO | Admitting: Pharmacist

## 2011-02-08 DIAGNOSIS — Z7901 Long term (current) use of anticoagulants: Secondary | ICD-10-CM

## 2011-02-08 DIAGNOSIS — I82409 Acute embolism and thrombosis of unspecified deep veins of unspecified lower extremity: Secondary | ICD-10-CM

## 2011-02-08 LAB — POCT INR: INR: 2.6

## 2011-02-08 NOTE — Patient Instructions (Signed)
Patient instructed to take medications as defined in the Anti-coagulation Track section of this encounter.  Patient instructed to take today's dose.  Patient verbalized understanding of these instructions.    

## 2011-02-08 NOTE — Progress Notes (Signed)
Anti-Coagulation Progress Note  Alicia Martinez is a 56 y.o. female who is currently on an anti-coagulation regimen.    RECENT RESULTS: Recent results are below, the most recent result is correlated with a dose of 40 mg. per week: Lab Results  Component Value Date   INR 2.6 02/08/2011   INR 4.3 01/18/2011   INR 3.15* 01/16/2011    ANTI-COAG DOSE:   Latest dosing instructions   Total Sun Mon Tue Wed Thu Fri Sat   40 7.5 mg 5 mg 5 mg 5 mg 7.5 mg 5 mg 5 mg    (5 mg1.5) (5 mg1) (5 mg1) (5 mg1) (5 mg1.5) (5 mg1) (5 mg1)         ANTICOAG SUMMARY: Anticoagulation Episode Summary              Current INR goal 2.0-3.0 Next INR check 03/08/2011   INR from last check 2.6 (02/08/2011)     Weekly max dose (mg)  Target end date Indefinite   Indications DEEP VENOUS THROMBOPHLEBITIS, RECURRENT, Long term current use of anticoagulant   [facility]    Send INR reminders to [facility]   Comments        Provider Role Specialty Phone number   Blanch Media  Internal Medicine [phone]        ANTICOAG TODAY: Anticoagulation Summary as of 02/08/2011              INR goal 2.0-3.0     Selected INR 2.6 (02/08/2011) Next INR check 03/08/2011   Weekly max dose (mg)  Target end date Indefinite   Indications DEEP VENOUS THROMBOPHLEBITIS, RECURRENT, Long term current use of anticoagulant    Anticoagulation Episode Summary              INR check location [facility] Preferred lab    Send INR reminders to [facility]   Comments        Provider Role Specialty Phone number   Blanch Media  Internal Medicine [phone]        PATIENT INSTRUCTIONS: Patient Instructions  Patient instructed to take medications as defined in the Anti-coagulation Track section of this encounter.  Patient instructed to take today's dose.  Patient verbalized understanding of these instructions.        FOLLOW-UP Return in 4 weeks (on 03/08/2011) for  Follow up INR.  [name], [name] Pharm.D., CACP

## 2011-02-09 NOTE — Assessment & Plan Note (Signed)
[facility]   NAME:  Alicia Martinez, Alicia Martinez           ACCOUNT NO.:  [id number]   MEDICAL RECORD NO.:  [id number]      DATE OF BIRTH:  1956-08-22   PHYSICIAN:  Lenon Curt. Chilton Si, M.D.   VISIT DATE:  01/31/2009                                   OFFICE VISIT   HISTORY:  A 55 year old female returns for recheck of varicose veins  with ulcers.  She has tolerated her treatment with Silvercel, Adaptic,  and Profore well.  Wounds are slowly healing.  She denies any pain or  discomfort.   PHYSICAL EXAMINATION:  Temperature 98.9, pulse 96, respirations 20, and  blood pressure 141/85.  Wound measurements #2, left lower extremity  distally 2.1 x 1.9 x 0.1, #3 left lower leg medially 0.3 x 0.2 x 0.1 cm,  wound #4 left lower extremity proximally 0.3 x 0.3 x 0.1 cm.  All wounds  were measured smaller than on January 24, 2009.   TREATMENT:  We reapplied Prisma, Adaptic, and Profore to the left leg.  The right leg is doing fine.  The patient is to return in 1 week for  reevaluation.   The left leg had an area of erythema and tenderness laterally and  perhaps a slight induration of the tissue.  Calves were normal and  medial thigh was normal.  There were no definite venous ports palpable.  We asked her to get a  venous Doppler of the left leg for possible DVT,  although my highest suspicion is this is simply a superficial  varicosity.  The patient did have pulmonary emboli in the past,  therefore, screening for DVT would be appropriate.  The patient left the  clinic late today and the vascular labs were close.  We authorized that  she be allowed to get her venous Doppler next week.   The patient will return in 1 week as noted above.   ICD-9 code 454.0 varicose vein with stasis ulcer.  CPT code 16109.      Lenon Curt Chilton Si, M.D.  Electronically Signed     AGG/MEDQ  D:  01/31/2009  T:  02/01/2009  Job:  [id number]

## 2011-02-09 NOTE — Consult Note (Signed)
NEW PATIENT CONSULTATION   Alicia Martinez, Alicia Martinez  DOB:  03-11-1956                                       01/21/2010  ZOXWR#:[id number]   The patient presents today for evaluation of venous hypertension in the  left leg.  She is an otherwise healthy 55 year old female with Martinez long  history of DVT.  She reports these were always in her left leg and  reports that the first case was in the 1980s and she subsequently had  DVT she thinks in 1991 and 1994.  She has been on and off Coumadin  therapy and apparently has never had any known hypercoagulability.  This  was not related to trauma.  She has had progressive swelling and  developed Martinez left leg venous ulcer at her ankle one or two years ago and  had Martinez recent recurrence with appropriate treatment and eventual healing  at the wound center with Dr. Chilton Si.  She is here today for further  evaluation.  She does report some discomfort with prolonged standing.  She does report that she has swelling more so in the left leg than right  leg.  She is currently on Coumadin therapy and does wear compression  garments.   PAST MEDICAL HISTORY:  Significant for hypertension, otherwise no  cardiac or pulmonary dysfunction.   FAMILY HISTORY:  Significant for premature atherosclerotic disease in  her siblings.   SOCIAL HISTORY:  She is single.  She has three children.  She works as Martinez  Chartered certified accountant.  She does smoke cigarettes.  Does not drink alcohol.   REVIEW OF SYSTEMS:  Positive for weight gain up to 250 pounds.  She is 5  feet 6 inches tall.  Cardiac, pulmonary, GI are negative.  GU:  Positive for urinary frequency.  VASCULAR:  Positive for prior DVT.  NEUROLOGIC:  Positive for dizziness.  MUSCULOSKELETAL:  Joint and muscle pain.  PSYCHIATRIC:  Negative.  HEENT:  Does report nosebleeds and change in eyesight.  HEMATOLOGY:  Does report history of anemia.  SKIN:  Without rashes or ulceration.   PHYSICAL EXAM:  General:  Martinez  well-developed, obese black female in no  acute stress.  Vital signs:  Blood pressure is 156/88, pulse 77,  respirations 20.  HEENT:  Normal.  Musculoskeletal:  Showed no major  deformities or cyanosis.  Neurological:  No focal weakness or  paresthesias.  Skin:  Without ulcers or rashes currently.  She does have  Martinez healed area of ulceration over her left medial malleolus and pretibial  area distally over her ankle.  She has no active ulcer currently.  She  does have marked changes of venous hypertension with thickening and  hemosiderin deposit from her mid calf down to her ankle.   She underwent noninvasive vascular laboratory studies in our office and  I reviewed the studies with the patient.  This does show severe reflux  in her left common femoral vein and superficial femoral vein.  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  She does have reflux in  her left great saphenous vein.  I discussed this with the patient.  I  explained that I feel the majority of her symptoms of venous  hypertension are related to deep venous incompetence and obstruction.  I  feel  that she is not Martinez candidate for ablation of her surface veins on  the left since with partial occlusive deep vein this could make matters  much worse rather than improved.  I explained the critical importance of  lifelong graduated compression stocking use and also the importance of  presenting immediately to the wound center should she have any early  breakdown.  She understands and will see [place] again on an as-needed basis.     Larina Earthly, M.D.  Electronically Signed   TFE/MEDQ  D:  01/21/2010  T:  01/22/2010  Job:  [id number]   cc:   Lenon Curt Chilton Si, M.D.  [facility]

## 2011-02-09 NOTE — Assessment & Plan Note (Signed)
[facility]   NAME:  Alicia Martinez, Alicia Martinez           ACCOUNT NO.:  [id number]   MEDICAL RECORD NO.:  [id number]      DATE OF BIRTH:  Jun 22, 1956   PHYSICIAN:  Lenon Curt. Chilton Si, M.D.   VISIT DATE:  01/24/2009                                   OFFICE VISIT   HISTORY:  A 55 year old female returns for recheck of her stasis ulcers  of the left lower extremity.  She was started on Silvercel, Adaptic, and  Profore, when seen on January 10, 2009.  She tolerated the procedures  well.  She feels that there is some improvement in the wounds but  perhaps not a lot and they continue to be painful.  She has not had any  fever, has only minimal amounts of drainage.   PHYSICAL EXAMINATION:  Temp 98.1, pulse 97, respirations 20, blood  pressure 155/86.  Wound measurements #2 left lower extremity distally  2.3 x 2.0 x 0.1.  Wound #3 left lower extremity medially 0.5 x 0.5 x 0.1  cm and left lower extremity proximally 0.3 x 0.4 x 0.1 cm.   Wounds have a punched out look.  There is a thin glassy-looking protein  eschar on the surface of each of the wounds.  There is tenderness to  manipulation of the wound.   TREATMENT:  Sharp debridement was done of the wounds of the left leg.  Overall, the wounds to my eye looked improved.  No anesthetic was used.  The patient tolerated the procedure reasonably well.  Only slough was  removed.  This is a selective debridement of 20 cm or less using a  scalpel with minimal bleeding encountered that was controlled with  pressure.   Following sharp debridement, the patient had Prisma to the wounds,  Adaptic, and Profore.  She will return in 1 week.   ICD-9 CODE:  454.0 varicose veins with ulcer.   CPT CODE:  56213 and 97597 selective debridement less than 20 sq. cm.      Lenon Curt Chilton Si, M.D.  Electronically Signed     AGG/MEDQ  D:  01/24/2009  T:  01/25/2009  Job:  [id number]

## 2011-02-09 NOTE — Assessment & Plan Note (Signed)
[facility]   NAME:  Alicia Martinez, Alicia Martinez           ACCOUNT NO.:  [id number]   MEDICAL RECORD NO.:  [id number]      DATE OF BIRTH:  14-Dec-1955   PHYSICIAN:  Lenon Curt. Chilton Si, M.D.   VISIT DATE:  04/25/2009                                   OFFICE VISIT   HISTORY:  A 55 year old female with varicose veins and ulcerations of  her lower extremities returns for recheck.  She feels that the wounds  are doing worse than last week and in fact seems to have two new areas  superior to the chronic wounds that are broken up.  There is increased  tenderness.  There is increased drainage.   Wound cultures on April 10, 2009 returned on April 17, 2009 showing  Enterobacter cloacae.  Dr. Leanord Hawking prescribed Cipro 500 mg twice daily  for 10 days and there is a note on this suggesting this was called into  a [employer].  The patient says she was never informed of the  drug being called in and she therefore has not been on this medication  for the last week.   EXAMINATION:  Temperature 98.8, pulse 78, respirations 18, blood  pressure 135/83.  Wound of the left lower leg anteriorly is 2.7 x 2.0 x  0.1 which is slightly larger than previously.  There is a beefy red  granular tissue at the base of this wound.  There is some seepage of  clear serosanguineous material.  The left lower leg anteriorly has a  second wound 1.2 x 1.1 x 0.1 which is about the same dimensions  previously.  Left lower leg medially has another wound 0.5 x 0.5 x 0.1  cm with fluid oozing from it, this is a new wound.   TREATMENT:  Ciprofloxacin 500 mg 20 tablets one twice daily for  infection was given to the patient.  Left lower extremity had Puracol  Plus Ag  applied to each of the wound, Adaptic and then an Radio broadcast assistant.  She is to return in 1 week for followup.   ICD-9 code 454.2 varicose vein with ulcer and inflammation.  682.6 cellulitis of the leg.  707.19 ulcer of the lower limb.   CPT  [id number].      Lenon Curt Chilton Si, M.D.  Electronically Signed     AGG/MEDQ  D:  04/25/2009  T:  04/26/2009  Job:  [id number]

## 2011-02-09 NOTE — Assessment & Plan Note (Signed)
[facility]   NAME:  Alicia Martinez, Alicia Martinez           ACCOUNT NO.:  [id number]   MEDICAL RECORD NO.:  [id number]      DATE OF BIRTH:  [birth year]   PHYSICIAN:  Lenon Curt. Chilton Si, M.D.   VISIT DATE:  04/11/2009                                   OFFICE VISIT   A 55 year old female with varicose veins and ulcerations of her left  lower extremity, returns for recheck today.  She has been treated with  Unna boots.  She is doing well.  She has no complaints of discomfort,  significant drainage, odor, or fever.   PHYSICAL EXAMINATION:  Temp 98.8, pulse 77, respirations 20, blood  pressure 144/85.  Capillary glucose not available.   WOUND MEASUREMENTS:  Left lower extremity 0.7 x 0.9 x 0.1 anteriorly,  the larger wound is 2.7 x 1.5 x 0.1 cm.  Both appear to have a clean  base.  There is a soft yellow eschar present on top of these wounds.  There appears to be pink epidermal tissue growing in from the edges and  some granulation buds in the center.  The patient says that she had a  little bleeding from the wounds with what little drainage there was.   TREATMENT:  Applied Puracol Plus Ag to the wounds covered with Adaptic  and then applied zinc and Unna boot on top of that.  The patient to  return in 1 week for nurse change of the dressing and see physician in 2  weeks.   ICD-9 454.0 varicose vein with ulcer.   CPT [id number].      Lenon Curt Chilton Si, M.D.  Electronically Signed     AGG/MEDQ  D:  04/11/2009  T:  04/12/2009  Job:  [id number]

## 2011-02-09 NOTE — Assessment & Plan Note (Signed)
[facility]   NAME:  Alicia Martinez, Alicia Martinez           ACCOUNT NO.:  [id number]   MEDICAL RECORD NO.:  [id number]      DATE OF BIRTH:  1956/02/26   PHYSICIAN:  Lenon Curt. Chilton Si, M.D.   VISIT DATE:  02/28/2009                                   OFFICE VISIT   HISTORY:  A 55 year old female with chronic venous stasis ulcers of her  left leg, returns for recheck today.  She has done well over the last  week.  Wound measurements indicate that they are slightly larger  ulcerations than the week before.  However, she denies any fever,  chills, increased pain, or odor.   Since last seen, she had a noninvasive left lower extremity venous  duplex evaluation on Feb 05, 2009.  This showed no evidence of deep vein  or superficial thrombosis involving the left lower extremity and no  evidence of Baker cyst on the left.   PHYSICAL EXAMINATION:  VITAL SIGNS:  Temperature 98.6, pulse 97,  respirations 18, blood pressure 146/86.  EXTREMITIES:  Wound measurements #2, left lower extremity distally now  2.1 x 1.8 x 0.2 cm; #3, left anterior lower extremity now 0.6 x 0.6 x  0.1 cm; and wound #4, left lower extremity now less than 0.1 x less than  0.1 x less than 0.1 cm.  There is minimal drainage from the wounds and  no eschar is evident.  Wound bases appear beefy red color.   TREATMENT:  Historically, this patient did have a MRSA cultured out of  her wounds, January 08, 2009, and treated with doxycycline.  Because of  the enlargement of the wounds, we elected to do another culture today of  the left shin wounds.   Following culture, she had Prisma, hydrogel, Adaptic, and Profore  reapplied.  She will return in 1 week for reinspection and change of her  dressing until full healing takes place.   ICD-9 code 454.0 varicose veins with ulcer.  CPT code 16109.      Lenon Curt Chilton Si, M.D.  Electronically Signed     AGG/MEDQ  D:  02/28/2009  T:  03/01/2009  Job:  [id number]

## 2011-02-09 NOTE — Assessment & Plan Note (Signed)
[facility]   NAME:  Alicia Martinez, Alicia Martinez           ACCOUNT NO.:  [id number]   MEDICAL RECORD NO.:  [id number]      DATE OF BIRTH:  [birth year]   PHYSICIAN:  Maxwell Caul, M.D.      VISIT DATE:                                   OFFICE VISIT   Alicia Martinez is a lady we have been following for superficial venous  stasis ulceration on the anterior left shin area.  Last week, we applied  Puracol gauze, Kerlix, and Coban.   She returns today unfortunately with a new wound on the superior aspect.  There is some surrounding pain and erythema here.  We have cultured this  wound and I have put her on doxycycline.  The other 2 wounds which were  the original wounds, both look very healthy or granulating and appear to  be epithelializing.   A culture was done.  I have put this woman on empiric antibiotics.  We  treated all the wounds with Puracol AG and dry dressing under an Unna  wrap.  We will see her again in a week's time.           ______________________________  Maxwell Caul, M.D.     MGR/MEDQ  D:  05/23/2009  T:  05/24/2009  Job:  [id number]

## 2011-02-09 NOTE — Assessment & Plan Note (Signed)
[facility]   NAME:  Alicia Martinez, Alicia Martinez           ACCOUNT NO.:  [id number]   MEDICAL RECORD NO.:  [id number]      DATE OF BIRTH:  Feb 17, 1956   PHYSICIAN:  Lenon Curt. Chilton Si, M.D.   VISIT DATE:  05/09/2009                                   OFFICE VISIT   HISTORY:  A 55 year old female returns for recheck of chronic venous  stasis ulcerations of the anterior left shin.  The patient feels that  she is doing well.  There is no pain in the wounds.  There has been less  drainage.  She denies fever, chills, or odor to the wound.   EXAMINATION:  Temperature 98.4, pulse 78, respirations 20, blood  pressure 128/79.  Wound measurements today, left anterior leg, 2.0 x 0.9 x shallow.  Left  distal leg now measures 0.8 x 0.3 x shallow.  Wounds actually looked  improved to me.  There is a bubbly pinkish granulation tissue and a  little bit of yellow drainage on top of them.  I think the wound  measurements although are accurately reflecting the circumscribed wound  and do not really account for the improvement in the interior of the  circumscribed area, we anticipate that full healing will be taking place  rapidly at this point.   TREATMENT:  Applied Puracol plus Ag again to the wounds, covered with  Adaptic, and then applied Unna boot.  The patient is to return for a  nurse check and bandage change in 1 week and for a physician check in 2  weeks for reevaluation.   ICD-9 454.2 varicose veins with ulceration and inflammation.  707.19 ulcer of the leg.  682.6 cellulitis of the leg, now resolved.   CPT [id number].      Lenon Curt Chilton Si, M.D.  Electronically Signed     AGG/MEDQ  D:  05/09/2009  T:  05/10/2009  Job:  [id number]

## 2011-02-09 NOTE — Assessment & Plan Note (Signed)
[facility]   NAME:  Alicia Martinez, Alicia Martinez           ACCOUNT NO.:  [id number]   MEDICAL RECORD NO.:  [id number]      DATE OF BIRTH:  01-21-1956   PHYSICIAN:  Lenon Curt. Chilton Si, M.D.   VISIT DATE:  03/28/2009                                   OFFICE VISIT   HISTORY:  A 55 year old female returns for re-inspection of varicose  vein with stasis ulcer of the left leg.  The wounds appear much the same  as it did last week.  There is a soft yellow eschar appearing on the top  of it, but she has had no pain, discharge, fever, or discomfort in the  leg.  She has tolerated the Unna boots well.   PHYSICAL EXAMINATION:  Temperature 98.6, pulse 85, respirations 16, and  blood pressure 132/83.  Wound #2, left lower leg distally 0.9 x 0.8 x  0.1.  Wound #3, left anterior lower extremity 2.5 x 2.0 x 0.1.  The  wound #3 actually is improved in size.  Soft yellow eschar is present on  top of both wounds as noted above.  No evidence of infection and no  odor.   TREATMENT:  Reapplied Adaptic and Unna boot to the left leg.  The  patient is advised to return in 2 weeks for physician appointment and in  1 week for dressing change with the nurse.   ICD-9 code 454.0 varicose vein with ulcer.   CPT code 16109.      Lenon Curt Chilton Si, M.D.  Electronically Signed     AGG/MEDQ  D:  03/28/2009  T:  03/29/2009  Job:  [id number]

## 2011-02-09 NOTE — Assessment & Plan Note (Signed)
[facility]   NAME:  Alicia Martinez, Alicia Martinez           ACCOUNT NO.:  [id number]   MEDICAL RECORD NO.:  [id number]      DATE OF BIRTH:  1955-12-01   PHYSICIAN:  Lenon Curt. Chilton Si, M.D.   VISIT DATE:  03/07/2009                                   OFFICE VISIT   HISTORY:  A 55 year old female returns for reinspection of wounds of the  left leg.  The patient feels the wounds are doing okay.  She has little  discomfort, there is some drainage present.  There has been no fever,  chills, or odor to the wounds.   PHYSICAL EXAMINATION:  VITAL SIGNS:  Temp 99.0, pulse 96, respirations  18, blood pressure 154/95.  WOUND MEASUREMENTS:  Today, #2 left lower extremity distally 2.7 x 2.0 x  0.1; #3 left anterior lower extremity 0.6 x 0.5 x 0.1; #4 left lower  extremity 0.1 x 0.1 x 0.1.  There is a beefy color to all wound surfaces  except #4 which is nearly closed.  Wound #2 seems to have been enlarged  little.   Lab report from culture done February 28, 2009, returned on March 06, 2009,  showing methicillin-resistant Staphylococcus aureus.  Prescription was  called in for doxycycline 100 mg twice daily to be taken for 15 days on  March 06, 2009.  The patient has not pick this up yet.   TREATMENT:  I feel that the wound is being compromised by continuing  infection.  She was strongly recommended to get the prescription for the  doxycycline which she says she will do this evening.  Coverage of the  wound was changed to an [employer], to be done weekly.  She will return  in 1 week for removal of the Unna boot and reinspection of the wound.   ICD-9 number 454.0 varicose vein ulceration.  682.6 cellulitis of the leg.   CPT [id number].      Lenon Curt Chilton Si, M.D.  Electronically Signed     AGG/MEDQ  D:  03/07/2009  T:  03/08/2009  Job:  [id number]

## 2011-02-09 NOTE — Assessment & Plan Note (Signed)
[facility]   NAME:  JAYDALYNN, OLIVERO           ACCOUNT NO.:  [id number]   MEDICAL RECORD NO.:  [id number]      DATE OF BIRTH:  Sep 15, 1956   PHYSICIAN:  Maxwell Caul, M.D. VISIT DATE:  05/16/2009                                   OFFICE VISIT   HISTORY:  Ms. Nemmers is a 55 year old woman who returns for review of  chronic venous stasis ulceration on the anterior lateral left shin area.  The patient is doing well.  She has no specific complaints.  She has  been using Puracol AG and Adaptic under an Radio broadcast assistant.   PHYSICAL EXAMINATION:  She is afebrile.  She has two remaining wounds on  the left leg.  They are dry appearing but have not changed much in terms  of dimensions.   IMPRESSION:  We applied Puracol to the wounds, hydrogel, and we wrapped  her in Kerlix and Coban.  We will follow her again in a week.           ______________________________  Maxwell Caul, M.D.     MGR/MEDQ  D:  05/16/2009  T:  05/17/2009  Job:  [id number]

## 2011-02-09 NOTE — Assessment & Plan Note (Signed)
[facility]   NAME:  Alicia Martinez, Alicia Martinez           ACCOUNT NO.:  [id number]   MEDICAL RECORD NO.:  [id number]      DATE OF BIRTH:  Nov 14, 1955   PHYSICIAN:  Joanne Gavel, M.D.              VISIT DATE:                                   OFFICE VISIT   HISTORY:  A 55 year old female with stasis disease and spontaneous  ulceration of the left lower extremity.   PHYSICAL EXAMINATION:  VITAL SIGNS:  She is afebrile.  Pulse 76.  Her  blood pressure is elevated at 180 systolic.  SKIN:  The wound measurements are essentially the same as at last visit.  There is 1 wound, the one noted as #4, which has essentially healed.  There is another very small wound 2 x 2 and a small sizable 1.8 x 1.7.  The largest wound does have some slough in its base.  Attempt was made  at minor debridement, but the patient was very uncomfortable and this  was stopped.   PLAN:  Treat with Santyl to see if we can clear the slough from the base  of this wound and also Adaptic and Profore.  See in 7 days.      Joanne Gavel, M.D.  Electronically Signed     RA/MEDQ  D:  02/21/2009  T:  02/22/2009  Job:  [id number]

## 2011-02-09 NOTE — Assessment & Plan Note (Signed)
[facility]   NAME:  Alicia Martinez, Alicia Martinez           ACCOUNT NO.:  [id number]   MEDICAL RECORD NO.:  [id number]      DATE OF BIRTH:  [birth year]   PHYSICIAN:  Lenon Curt. Chilton Si, M.D.        VISIT DATE:                                   OFFICE VISIT   HISTORY:  A 55 year old female returns for reinspection of wounds of her  left leg.  Wounds have done fairly well since she was last seen and are  closing, and there is no evidence of infection at the wounds and beefy  red base is present.   PHYSICAL EXAMINATION:  VITAL SIGNS:  Temperature 98.6, pulse 86,  respirations 19, blood pressure 131/84.  SKIN:  Wound measurements #2:  Left lower extremity distally 2.1 x 1.0 x  0.1.  Wound #3:  Left lower extremity medially less than 0.2 x less than  0.2 x less than 0.1 cm.  Wound #4:  Left lower extremity proximally,  less than 0.2 x less than 0.2 x less than 0.1.  All wounds appear clean.  There is no surrounding erythema or discharge.   TREATMENT:  Prisma covered by Adaptic and then Profore was applied to  the left shin area that should be done weekly.  She is to be scheduled  for nurse visit in 1 week and doctor visit in 2 weeks.   Wound culture done on January 16, 2009, did show methicillin-resistant  staph aureus on the wound abundantly present, doxycycline was prescribed  on January 16, 2009.  She has now finished with that prescription   The patient had callus excessively built up along the inside of her foot  across the heel and she requested this be debrided today, although it  was not a part of her current wound care.  We did comply with her  request and debrided a significant amount of callus from the heel as  well as medial to her great toe.   ICD-9 CODE:  454.0.   CPT CODE:  04540.      Lenon Curt Chilton Si, M.D.  Electronically Signed     AGG/MEDQ  D:  02/07/2009  T:  02/08/2009  Job:  [id number]

## 2011-02-09 NOTE — Assessment & Plan Note (Signed)
[facility]   NAME:  Alicia Martinez, Alicia Martinez           ACCOUNT NO.:  [id number]   MEDICAL RECORD NO.:  [id number]      DATE OF BIRTH:  Oct 26, 1955   PHYSICIAN:  Joanne Gavel, M.D.         VISIT DATE:  03/14/2009                                   OFFICE VISIT   HISTORY OF PRESENT ILLNESS:  This is a 55 year old female with stasis  ulcerations of the left lower extremity.  Last time, the patient was  treated with Radio broadcast assistant and Santyl.   Physical examination reveals temperature 98.5, pulse 100, respirations  18, blood pressure 145/80.  The examination of the leg reveals decreased  swelling.  One of the wounds has healed.  There are 2 other wounds that  are slightly smaller and covered with a thick slough.   TREATMENT:  Using local anesthetic ointment and local anesthetic  injection, the slough is cleared from the wounds.  Hemostasis was  obtained with pressure.   PLAN:  Prisma, hydrogel, Xtrasorb, and [employer].   FOLLOWUP:  See in 7 days.      Joanne Gavel, M.D.  Electronically Signed     RA/MEDQ  D:  03/14/2009  T:  03/15/2009  Job:  [id number]

## 2011-02-09 NOTE — Assessment & Plan Note (Signed)
[facility]   NAME:  Alicia Martinez, Alicia Martinez           ACCOUNT NO.:  [id number]   MEDICAL RECORD NO.:  [id number]      DATE OF BIRTH:  1956/09/19   PHYSICIAN:  Lenon Curt. Chilton Si, M.D.   VISIT DATE:  05/02/2009                                   OFFICE VISIT   HISTORY:  A 55 year old female returns for recheck of chronic venous  stasis ulcerations on the anterior left shin area.  The patient is doing  well.  Wounds are drier.  There was some cellulitis, previously active,  which seems to be coming under good control with the Cipro that she  remains on.  She has had no odor, no significant drainage and the wounds  are more comfortable.   PHYSICAL EXAMINATION:  Temperature 98.6, pulse 84, respirations 18,  blood pressure 147/84.  The wound of the left anterior leg now 2.1 x 1.1  x shallow depth, left distal leg 0.6 x 0.5 x shallow depth.  Wounds were  clean and have a beefy red base and some new skin growth in from the  edges.   TREATMENT:  Puracol Plus AG over the wounds, covered by Adaptic and then  application of an Radio broadcast assistant.  The patient is to return in 1 week.   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  682.6 cellulitis of the leg.  707.19 ulcer of the lower leg.   CPT [id number].      Lenon Curt Chilton Si, M.D.  Electronically Signed     AGG/MEDQ  D:  05/02/2009  T:  05/03/2009  Job:  [id number]

## 2011-02-09 NOTE — Assessment & Plan Note (Signed)
[facility]   NAME:  Alicia Martinez, Alicia Martinez           ACCOUNT NO.:  [id number]   MEDICAL RECORD NO.:  [id number]      DATE OF BIRTH:  [birth year]   PHYSICIAN:  Lenon Curt. Chilton Si, M.D.   VISIT DATE:  01/10/2009                                   OFFICE VISIT   HISTORY:  A 55 year old female [facility] today as a new patient.  She has been a prior patient here in  2007 with stasis ulcers, left lower extremity, which ultimately healed.  She is a primary care patient at [facility], but is  unable to give me the name of her doctor there.  She returns today with  new ulcerations in the left lower leg that have been increasing over the  last 3 weeks.  She says that the wound started as a blister.  She has  acquired 2 more ulcerated areas since the first one appeared.  These  were 69 weeks of age in 16 days of age.   PAST MEDICAL HISTORY:  She has been on Coumadin for leg clots 10 years  ago including one on the left leg.  She has not been on Coumadin for the  last 5 years.  There has been no significant leg pain.  There is  considerable amount of skin damage related to the chronic venous stasis  disease that she currently has.  Current wounds are in the area of the  chronic venous stasis changes.  She denies any other significant medical  problems.   No current medications.  No allergies.   FAMILY HISTORY:  Unremarkable.  There is another member who has had a  deep vein thrombosis.   SOCIAL HISTORY:  Married.  She is a 35-year smoker, approximately half  pack per day.  No recreational or illicit drug use.   REVIEW OF SYSTEMS:  She says she has problems with legs feeling tired  and achy.  She thinks she has poor circulation.  There is discomfort in  the legs with walking.  Blood clots in the legs including the left leg  have been noted 4 times with the last one being in 1996.  She  occasionally has trouble swallowing certain  foods and there are reflux  issues from time to time.  There is a prior family history of thyroid  problems and the patient denies any problems herself.   PHYSICAL EXAMINATION:  VITAL SIGNS:  Temperature 98.4, pulse 92,  respirations 20, blood pressure 148/90.  GENERAL:  Overweight female, alert, cooperative, and a good historian.  SKIN:  Wound measurement, the left lower extremity wound is 2.0 x 1.5 x  0.1 cm in depth.  It is a typical venous stasis ulceration in the midst  of chronic brawny edema with skin changes and these per chronic venous  insufficiency.  There is approximately 2+ edema of both lower legs.  There are smaller ulcerations above the main one noted above and both  are less than 5 mm in diameter.  HEAD, EYES, EARS, NOSE, AND THROAT:  Unremarkable.  NECK:  Supple.  No thyromegaly, nodule, or mass.  CHEST:  Clear.  HEART:  Regular rhythm.  ABDOMEN:  Obese, nontender.  EXTREMITIES:  Edema as noted above.  NEUROLOGIC:  Grossly normal.  Cranial nerves appear to be intact.  Moves  all extremities well.  Distal sensation normal.   TREATMENT:  Silvercel was applied to the wounds of the left leg covered  with Adaptic and Profore.  Compression dressings were applied.  She is  to return in 1-2 weeks for recheck.  We discussed etiology of her  conditions and may be that she should see one of the vascular doctors  about further diagnosis of her venous problems once her wounds are  healing.   ICD-9 code 454.0, varicose veins with ulcer.   CPT code 91478.      Lenon Curt Chilton Si, M.D.  Electronically Signed     AGG/MEDQ  D:  01/10/2009  T:  01/11/2009  Job:  [id number]

## 2011-02-09 NOTE — Assessment & Plan Note (Signed)
[facility]   NAME:  Alicia Martinez, Alicia Martinez           ACCOUNT NO.:  [id number]   MEDICAL RECORD NO.:  [id number]      DATE OF BIRTH:  04-Mar-1956   PHYSICIAN:  Lenon Curt. Chilton Si, M.D.   VISIT DATE:  03/21/2009                                   OFFICE VISIT   HISTORY:  A 55 year old female returns for recheck stasis ulcerations,  left lower extremity.  She was seen 1 week ago by Dr. Wiliam Ke who did a  vigorous debridement of the area under local anesthesia.  Slough was  cleared from the top of the wounds at that time.   Over the last week, the patient has done fairly well.  She had some  intense pain following the last debridement, but says that she got over  that within a couple days.  Slough has reaccumulated over the wounds but  there has been no odor, drainage, or fever.   PHYSICAL EXAMINATION:  Wound #2, left lower extremity, distally is the  smaller wound and now measures 0.9 x 0.8 x 0.1 cm.  Wound #3 is larger  and is at the left anterior lower extremity, now measuring 2.8 x 2.4 x  0.1.  Both wounds were covered by a soft eschar/slough.  There is no  significant drainage noted at the time of my examination.  Wounds were  only mildly uncomfortable.   TREATMENT:  The left leg had Adaptic placed over the wounds and an Unna  boot reapplied.   She is to return in 1 week for recheck.   ICD-9 CODE:  454.0 varicose veins with ulcers.   CPT CODE:  69629.      Lenon Curt Chilton Si, M.D.  Electronically Signed     AGG/MEDQ  D:  03/21/2009  T:  03/22/2009  Job:  [id number]

## 2011-02-12 NOTE — Consult Note (Signed)
Alicia Martinez, Alicia Martinez           ACCOUNT NO.:  [id number]   MEDICAL RECORD NO.:  [id number]           PATIENT TYPE:   LOCATION:                                 FACILITY:   PHYSICIAN:  Theresia Majors. Tanda Rockers, M.D.DATE OF BIRTH:  11-May-1956   DATE OF CONSULTATION:  12/02/2005  DATE OF DISCHARGE:                                   CONSULTATION   REASON FOR CONSULTATION:  Alicia Martinez is a 55 year old female referred by  Dr. Zetta Bills, of the [facility] for evaluation of a nonhealing  ulceration on the left lower extremity.   IMPRESSION:  Postphlebitic syndrome with stasis ulcer, left lower extremity.   RECOMMENDATIONS:  1.  Proceed with an Interior and spatial designer.  2.  Continue Coumadin prophylaxis for the recurrent DVT and postphlebitic      state.   SUBJECTIVE:  Alicia Martinez 55 year old lady who was initially seen in 2003 for  a diagnosis of recurrent pulmonary emboli.  Subsequent to her admission in  the wound center, she had been on Coumadin but discontinued Coumadin  recently.   She has developed progressive discoloration in her left lower extremity  associated with an enlarging ulceration.  She has applied multiple ointments  and salves without improvement.  She was seen by Dr. Allena Katz and subsequently  referred to the wound center.  Past medical history is significant for the  absence of chronic medication with the exception of Coumadin.  The patient  is not taking Coumadin, and she has not had her blood tested within the last  2 months.  She denies shortness of breath or palpitations.  Her previous  surgery has included a hysterectomy.  She has been evaluated and managed for  a stasis ulcer in the past, treated with custom hose as well as Una wraps.  Her family history is positive for hypertension and diabetes.  Socially, she  is single.  She has 3 adult children.  She is employed by [employer] in  [place].   Review of systems is negative for dyspnea on exertion,  palpitations, recent  weight loss or gain.  She denies eye changes or visual changes, polydipsia,  polyuria, or GI or GU symptoms.  The accompanying CBC is within normal  limits.  Her coagulation profile is similarly normal.  Her BUN is 9, and her  creatinine is 0.6.  Glucose was 101.   Physical exam of the lower extremity shows that there is a classic stasis  ulcer on the medial aspect of the left lower extremity.  The ulcer bed  itself shows granulating tissue.  There is moderate exudate.  There is no  malodor.  There is hyperpigmentation.  Her pulses are 3+ bilaterally in the  posterior tibia and dorsalis pedis.  Neurologically she retains protective  sensation.   DISCUSSION:  Alicia Martinez has a classic stasis ulcer which is likely related  to her postphlebitic state.  We have strongly encouraged her to continue on  her Coumadin anticoagulation to avoid further complications.  We have  expressed a willingness to see her in follow up and assume the management of  her stasis ulcer utilizing our compression protocol.  The patient seems to  understand, expresses gratitude for having been seen in the clinic.  We plan  to see her in follow-up in 1 week.           ______________________________  Theresia Majors. Tanda Rockers, M.D.     Cephus Slater  D:  12/02/2005  T:  12/03/2005  Job:  [id number]   cc:   Zetta Bills, MD  Fax: [phone]

## 2011-02-12 NOTE — Assessment & Plan Note (Signed)
[facility]   NAME:  Alicia Martinez, Alicia Martinez           ACCOUNT NO.:  [id number]   MEDICAL RECORD NO.:  [id number]      DATE OF BIRTH:  Aug 25, 1956   PHYSICIAN:  Theresia Majors. Tanda Rockers, M.D. VISIT DATE:  02/03/2006                                     OFFICE VISIT   Alicia Martinez is a 55 year old lady who returns for a followup of a stasis  ulcer on her left lower extremity.  The ulceration is completely resolved.  She has persistent 3+ edema.   ASSESSMENT:  Resolved stasis ulcer with persistent __________  changes in  the leg.   PLAN:  1.  We have given the patient a prescription for a 30-to-40-mm below the      knee compression hose.  She is to wear this hose continuously during her      ambulatory hours.  She is not to sleep in the stocking.  It will require      replacement in three to four months.  2.  We will see the patient in the clinic on a p.r.n. basis or as directed      by her primary care physician.           ______________________________  Theresia Majors. Tanda Rockers, M.D.     Cephus Slater  D:  02/03/2006  T:  02/04/2006  Job:  [id number]

## 2011-02-24 ENCOUNTER — Other Ambulatory Visit: Payer: Self-pay | Admitting: Obstetrics

## 2011-02-24 DIAGNOSIS — N83209 Unspecified ovarian cyst, unspecified side: Secondary | ICD-10-CM

## 2011-03-08 ENCOUNTER — Ambulatory Visit: Payer: BC Managed Care – PPO

## 2011-03-23 ENCOUNTER — Other Ambulatory Visit: Payer: Self-pay | Admitting: Obstetrics

## 2011-03-23 DIAGNOSIS — Z1231 Encounter for screening mammogram for malignant neoplasm of breast: Secondary | ICD-10-CM

## 2011-04-05 ENCOUNTER — Ambulatory Visit (HOSPITAL_COMMUNITY)
Admission: RE | Admit: 2011-04-05 | Discharge: 2011-04-05 | Disposition: A | Discharge status: Home / Self Care | Payer: BC Managed Care – PPO | Source: Ambulatory Visit | Attending: Obstetrics | Admitting: Obstetrics

## 2011-04-05 DIAGNOSIS — Z1231 Encounter for screening mammogram for malignant neoplasm of breast: Secondary | ICD-10-CM | POA: Insufficient documentation

## 2011-04-20 ENCOUNTER — Ambulatory Visit (HOSPITAL_COMMUNITY)
Admission: RE | Admit: 2011-04-20 | Discharge: 2011-04-20 | Disposition: A | Discharge status: Home / Self Care | Payer: BC Managed Care – PPO | Source: Ambulatory Visit | Attending: Obstetrics | Admitting: Obstetrics

## 2011-04-20 DIAGNOSIS — N83209 Unspecified ovarian cyst, unspecified side: Secondary | ICD-10-CM | POA: Insufficient documentation

## 2011-04-20 DIAGNOSIS — N838 Other noninflammatory disorders of ovary, fallopian tube and broad ligament: Secondary | ICD-10-CM | POA: Insufficient documentation

## 2011-04-21 ENCOUNTER — Ambulatory Visit (HOSPITAL_COMMUNITY): Payer: BC Managed Care – PPO

## 2011-05-21 ENCOUNTER — Other Ambulatory Visit: Payer: Self-pay | Admitting: Obstetrics

## 2011-06-09 ENCOUNTER — Other Ambulatory Visit: Payer: Self-pay | Admitting: *Deleted

## 2011-06-09 DIAGNOSIS — Z7901 Long term (current) use of anticoagulants: Secondary | ICD-10-CM

## 2011-06-09 DIAGNOSIS — I82409 Acute embolism and thrombosis of unspecified deep veins of unspecified lower extremity: Secondary | ICD-10-CM

## 2011-06-10 ENCOUNTER — Other Ambulatory Visit: Payer: Self-pay | Admitting: Pharmacist

## 2011-06-10 DIAGNOSIS — Z7901 Long term (current) use of anticoagulants: Secondary | ICD-10-CM

## 2011-06-10 DIAGNOSIS — I82409 Acute embolism and thrombosis of unspecified deep veins of unspecified lower extremity: Secondary | ICD-10-CM

## 2011-06-10 MED ORDER — WARFARIN SODIUM 5 MG PO TABS: ORAL_TABLET | ORAL | Status: DC

## 2011-09-06 ENCOUNTER — Telehealth: Payer: Self-pay | Admitting: Internal Medicine

## 2011-09-06 NOTE — Telephone Encounter (Signed)
Message was left for patient to get her INR rechecked.

## 2011-11-01 ENCOUNTER — Ambulatory Visit (INDEPENDENT_AMBULATORY_CARE_PROVIDER_SITE_OTHER): Payer: 59 | Admitting: Internal Medicine

## 2011-11-01 ENCOUNTER — Ambulatory Visit (INDEPENDENT_AMBULATORY_CARE_PROVIDER_SITE_OTHER): Payer: 59 | Admitting: Pharmacist

## 2011-11-01 ENCOUNTER — Encounter: Payer: Self-pay | Admitting: Internal Medicine

## 2011-11-01 VITALS — BP 129/75 | HR 93 | Temp 98.6°F | Ht 60.0 in | Wt 268.7 lb

## 2011-11-01 DIAGNOSIS — R21 Rash and other nonspecific skin eruption: Secondary | ICD-10-CM

## 2011-11-01 DIAGNOSIS — I82409 Acute embolism and thrombosis of unspecified deep veins of unspecified lower extremity: Secondary | ICD-10-CM

## 2011-11-01 DIAGNOSIS — Z85048 Personal history of other malignant neoplasm of rectum, rectosigmoid junction, and anus: Secondary | ICD-10-CM

## 2011-11-01 DIAGNOSIS — F17200 Nicotine dependence, unspecified, uncomplicated: Secondary | ICD-10-CM

## 2011-11-01 DIAGNOSIS — Z0000 Encounter for general adult medical examination without abnormal findings: Secondary | ICD-10-CM

## 2011-11-01 DIAGNOSIS — N9489 Other specified conditions associated with female genital organs and menstrual cycle: Secondary | ICD-10-CM

## 2011-11-01 DIAGNOSIS — Z7901 Long term (current) use of anticoagulants: Secondary | ICD-10-CM

## 2011-11-01 DIAGNOSIS — E279 Disorder of adrenal gland, unspecified: Secondary | ICD-10-CM

## 2011-11-01 DIAGNOSIS — C20 Malignant neoplasm of rectum: Secondary | ICD-10-CM

## 2011-11-01 DIAGNOSIS — I10 Essential (primary) hypertension: Secondary | ICD-10-CM

## 2011-11-01 LAB — POCT INR: INR: 2.3

## 2011-11-01 MED ORDER — VARENICLINE TARTRATE 0.5 MG PO TABS: ORAL_TABLET | ORAL | Status: DC

## 2011-11-01 MED ORDER — HYDROCHLOROTHIAZIDE 25 MG PO TABS: 12.5000 mg | ORAL_TABLET | Freq: Every day | ORAL | Status: DC

## 2011-11-01 MED ORDER — WARFARIN SODIUM 5 MG PO TABS: ORAL_TABLET | ORAL | Status: DC

## 2011-11-01 NOTE — Progress Notes (Signed)
Anti-Coagulation Progress Note  LATEISHA THURLOW is a 56 y.o. female who is currently on an anti-coagulation regimen.    RECENT RESULTS: Recent results are below, the most recent result is correlated with a dose of 40 mg. per week: Lab Results  Component Value Date   INR 2.30 11/01/2011   INR 2.6 02/08/2011   INR 4.3 01/18/2011    ANTI-COAG DOSE:   Latest dosing instructions   Total Sun Mon Tue Wed Thu Fri Sat   40 7.5 mg 5 mg 5 mg 5 mg 7.5 mg 5 mg 5 mg    (5 mg1.5) (5 mg1) (5 mg1) (5 mg1) (5 mg1.5) (5 mg1) (5 mg1)         ANTICOAG SUMMARY: Anticoagulation Episode Summary              Current INR goal 2.0-3.0 Next INR check 12/06/2011   INR from last check 2.30 (11/01/2011)     Weekly max dose (mg)  Target end date Indefinite   Indications DEEP VENOUS THROMBOPHLEBITIS, RECURRENT, Long term current use of anticoagulant   INR check location [facility] lab    Send INR reminders to ANTICOAG IMP   Comments        Provider Role Specialty Phone number   Blanch Media, MD  Internal Medicine [phone]        ANTICOAG TODAY: Anticoagulation Summary as of 11/01/2011              INR goal 2.0-3.0     Selected INR 2.30 (11/01/2011) Next INR check 12/06/2011   Weekly max dose (mg)  Target end date Indefinite   Indications DEEP VENOUS THROMBOPHLEBITIS, RECURRENT, Long term current use of anticoagulant    Anticoagulation Episode Summary              INR check location [facility] lab    Send INR reminders to ANTICOAG IMP   Comments        Provider Role Specialty Phone number   Blanch Media, MD  Internal Medicine [phone]        PATIENT INSTRUCTIONS: Patient Instructions  Patient instructed to take medications as defined in the Anti-coagulation Track section of this encounter.  Patient instructed to take today's dose.  Patient verbalized understanding of these instructions.        FOLLOW-UP Return in 5 weeks (on 12/06/2011)  for Follow up INR.  [name], [name] Pharm.D., CACP

## 2011-11-01 NOTE — Progress Notes (Signed)
  Subjective:    Patient ID: Alicia Martinez, female    DOB: Mar 04, 1956, 56 y.o.   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  1)Rash: She reports having occasional skin breakdown, associated with a lot of itching for last few months. The  rash usually appearson the arms,  legs , chest and back. She denies noticing any lesions in the ribs of the finger and denies any family member suffering from the similar complaint. 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 But as of today she reports that her symptoms to be quiescent.   2) Tobacco abuse: She is a current smoker- smokes about a pack per day and was requesting to be prescribed Chantix.  3) History of recurrent DVTs: Patient is on lifelong Coumadin but hasn't followed up with Dr. Alexandria Lodge for INR checkup for almost a year but states that she has been taking her Coumadin as directed . Her last dose was a day prior to the clinic visit.      Review of Systems  HENT: Negative for congestion and sneezing.   Gastrointestinal: Negative for constipation.  Skin: Positive for rash.  Neurological: Negative for dizziness, facial asymmetry and headaches.  Hematological: Negative for adenopathy.  Psychiatric/Behavioral: Negative for agitation.       Objective:   Physical Exam  Constitutional: She is oriented to person, place, and time. She appears well-developed. No distress.  HENT:  Head: Normocephalic and atraumatic.  Mouth/Throat: No oropharyngeal exudate.  Eyes: Conjunctivae and EOM are normal. Pupils are equal, round, and reactive to light.  Neck: Normal range of motion. Neck supple. No JVD present.  No tracheal deviation present. No thyromegaly present.  Cardiovascular: Normal rate, regular rhythm, normal heart sounds and intact distal pulses.  Exam reveals no gallop and no friction rub.   No murmur heard. Pulmonary/Chest: Effort normal. No stridor. No respiratory distress. She has no wheezes. She has no rales. She exhibits no tenderness.  Abdominal: Soft. Bowel sounds are normal. She exhibits no distension and no mass. There is no tenderness. There is no rebound and no guarding.  Musculoskeletal: Normal range of motion. She exhibits no edema.  Lymphadenopathy:    She has no cervical adenopathy.  Neurological: She is alert and oriented to person, place, and time. She has normal reflexes. She displays normal reflexes. No cranial nerve deficit. She exhibits normal muscle tone. Coordination normal.  Skin: She is not diaphoretic.       Very faint two small  lesions  About 1mm in diameter were noticed on the right and left arm that can be described as maculopapular rash with the overlying skin breakdown from itching.          Assessment & Plan:

## 2011-11-01 NOTE — Patient Instructions (Signed)
Please schedule a follow up appointment in 3 months. I will call you with your lab results if something will be abnormal.

## 2011-11-01 NOTE — Patient Instructions (Signed)
Patient instructed to take medications as defined in the Anti-coagulation Track section of this encounter.  Patient instructed to take today's dose.  Patient verbalized understanding of these instructions.    

## 2011-11-02 LAB — COMPLETE METABOLIC PANEL WITH GFR
BUN: 15 mg/dL (ref 6–23)
CO2: 28 mEq/L (ref 19–32)
GFR, Est African American: >89 mL/min
GFR, Est Non African American: >89 mL/min
Glucose, Bld: 107 mg/dL — ABNORMAL HIGH (ref 70–99)
Sodium: 139 mEq/L (ref 135–145)
Total Bilirubin: 0.3 mg/dL (ref 0.3–1.2)
Total Protein: 6.6 g/dL (ref 6.0–8.3)

## 2011-11-02 LAB — CBC WITH DIFFERENTIAL/PLATELET
Eosinophils Relative: 2 % (ref 0–5)
HCT: 40.4 % (ref 36.0–46.0)
Lymphocytes Relative: 28 % (ref 12–46)
Lymphs Abs: 1.8 10*3/uL (ref 0.7–4.0)
MCH: 29.7 pg (ref 26.0–34.0)
MCV: 94.4 fL (ref 78.0–100.0)
Monocytes Absolute: 0.4 10*3/uL (ref 0.1–1.0)
RBC: 4.28 MIL/uL (ref 3.87–5.11)
WBC: 6.5 10*3/uL (ref 4.0–10.5)

## 2011-11-02 LAB — LIPID PANEL
Cholesterol: 143 mg/dL (ref 0–200)
HDL: 29 mg/dL — ABNORMAL LOW (ref >39)

## 2011-11-03 DIAGNOSIS — C20 Malignant neoplasm of rectum: Secondary | ICD-10-CM | POA: Insufficient documentation

## 2011-11-03 DIAGNOSIS — R21 Rash and other nonspecific skin eruption: Secondary | ICD-10-CM | POA: Insufficient documentation

## 2011-11-03 NOTE — Assessment & Plan Note (Signed)
Lab Results  Component Value Date   NA 139 11/01/2011   K 3.8 11/01/2011   CL 102 11/01/2011   CO2 28 11/01/2011   BUN 15 11/01/2011   CREATININE 0.76 11/01/2011   CREATININE 0.62 01/16/2011    BP Readings from Last 3 Encounters:  11/01/11 129/75  01/18/11 135/79  12/30/10 145/75    Assessment: Hypertension control:  controlled  Progress toward goals:  at goal Barriers to meeting goals:  no barriers identified  Plan: Hypertension treatment:  continue current medications

## 2011-11-03 NOTE — Assessment & Plan Note (Addendum)
She was counseled on tobacco cessation and she is interested  to quit.  - She was given a prescription for Chantix and was educated on the side effects.

## 2011-11-03 NOTE — Assessment & Plan Note (Signed)
She is on long-term anticoagulation  but hasn't followed up with Dr. Alexandria Lodge for almost a year to get PT/INR check.  - Will discussed the other options like Pradaxa that doesn't require routine follow ups for PT/INR with subsequent visits.

## 2011-11-03 NOTE — Assessment & Plan Note (Addendum)
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 On exam she was noticed to have one or two small lesions appearing like a maculopapular rash with overlying skin breakdown from itching. I think this is most likely contact dermatitis. Scabies appears very less likely due to the distribution of the rash. Rash appears to be  healing at this point and she herself states that it's much better than before. - She was advised to continue using topical steroids and Benadryl - She was advised to call if his rash gets worse.

## 2011-11-03 NOTE — Assessment & Plan Note (Signed)
Follow up ultrasound in 05/11 shows resolution

## 2011-11-17 NOTE — Telephone Encounter (Signed)
Unable to reach the patient left message. 

## 2011-12-06 ENCOUNTER — Ambulatory Visit: Payer: 59

## 2011-12-13 ENCOUNTER — Ambulatory Visit: Payer: 59

## 2012-01-20 ENCOUNTER — Encounter (HOSPITAL_BASED_OUTPATIENT_CLINIC_OR_DEPARTMENT_OTHER): Payer: 59 | Attending: Internal Medicine

## 2012-01-20 DIAGNOSIS — I872 Venous insufficiency (chronic) (peripheral): Secondary | ICD-10-CM | POA: Insufficient documentation

## 2012-01-20 DIAGNOSIS — L97809 Non-pressure chronic ulcer of other part of unspecified lower leg with unspecified severity: Secondary | ICD-10-CM | POA: Insufficient documentation

## 2012-01-20 DIAGNOSIS — Z86718 Personal history of other venous thrombosis and embolism: Secondary | ICD-10-CM | POA: Insufficient documentation

## 2012-01-20 DIAGNOSIS — Z7901 Long term (current) use of anticoagulants: Secondary | ICD-10-CM | POA: Insufficient documentation

## 2012-01-20 NOTE — Progress Notes (Signed)
[facility]  NAME:  Alicia Martinez, Alicia Martinez           ACCOUNT NO.:  [id number]  MEDICAL RECORD NO.:  [id number]      DATE OF BIRTH:  06/30/1956  PHYSICIAN:  Maxwell Caul, M.D. VISIT DATE:  01/20/2012                                  OFFICE VISIT   HISTORY OF PRESENT ILLNESS:  Alicia Martinez is a patient that we have actually had in this clinic previously for treatment of recurrent venous stasis ulcers.  She tells me that she developed what sounds like a small abscess on her left anterior leg roughly 2 weeks ago.  She is not aware of any precipitating trauma or anything else that could have contributed to this.  This eventually opened and she has been left with a small open wound on her left anterior leg.  She has had previous problems with this leg with predominantly venous stasis ulcers in the past.  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  She is not aware if she has had a workup for any factors that could be contributing to this.  PAST MEDICAL HISTORY: 1. 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 2. History of tobacco abuse. 3. History of hypertension. 4. History of resection of a colorectal cancer in 2010. 5. History of a hysterectomy in 1998.  MEDICATION:  List is reviewed.  She is on Coumadin, hydrochlorothiazide.  PHYSICAL EXAMINATION:  VITAL SIGNS:  Her temperature is 98.7, pulse 80, respirations 18, blood pressure 158/93. Vascular evaluation in the legs done in this clinic showed an ankle- brachial index of 1.09, her peripheral pulses at the dorsalis pedis are briskly palpable.  WOUND EXAMINATION:  The area is on the left anterior leg measured 1.3 x 0.2 x 0.1.  There was no evidence of infection here, nothing needed culturing.  There is a slight surface eschar.  However, I elected not to debride this today.  IMPRESSION:  Venous stasis ulceration, recurrent.  The area actually has several closed wounds from prior problems in this area.  She was  dressed with silver alginate, Vaseline gauze, and was placed in an [employer]. We will review this again in 1 week's time.         ______________________________ Maxwell Caul, M.D.    MGR/MEDQ  D:  01/20/2012  T:  01/20/2012  Job:  [id number]

## 2012-01-27 ENCOUNTER — Encounter (HOSPITAL_BASED_OUTPATIENT_CLINIC_OR_DEPARTMENT_OTHER): Payer: 59 | Attending: Internal Medicine

## 2012-01-27 DIAGNOSIS — L97809 Non-pressure chronic ulcer of other part of unspecified lower leg with unspecified severity: Secondary | ICD-10-CM | POA: Insufficient documentation

## 2012-01-27 DIAGNOSIS — I872 Venous insufficiency (chronic) (peripheral): Secondary | ICD-10-CM | POA: Insufficient documentation

## 2012-02-17 ENCOUNTER — Encounter (HOSPITAL_BASED_OUTPATIENT_CLINIC_OR_DEPARTMENT_OTHER): Payer: 59

## 2012-02-28 ENCOUNTER — Other Ambulatory Visit: Payer: Self-pay | Admitting: Internal Medicine

## 2012-02-28 DIAGNOSIS — Z1231 Encounter for screening mammogram for malignant neoplasm of breast: Secondary | ICD-10-CM

## 2012-03-25 ENCOUNTER — Other Ambulatory Visit: Payer: Self-pay | Admitting: Internal Medicine

## 2012-03-25 DIAGNOSIS — I82409 Acute embolism and thrombosis of unspecified deep veins of unspecified lower extremity: Secondary | ICD-10-CM

## 2012-03-29 ENCOUNTER — Other Ambulatory Visit: Payer: Self-pay | Admitting: *Deleted

## 2012-03-29 DIAGNOSIS — I82409 Acute embolism and thrombosis of unspecified deep veins of unspecified lower extremity: Secondary | ICD-10-CM

## 2012-04-07 ENCOUNTER — Ambulatory Visit (HOSPITAL_COMMUNITY)
Admission: RE | Admit: 2012-04-07 | Discharge: 2012-04-07 | Disposition: A | Discharge status: Home / Self Care | Payer: 59 | Source: Ambulatory Visit | Attending: Internal Medicine | Admitting: Internal Medicine

## 2012-04-07 DIAGNOSIS — Z1231 Encounter for screening mammogram for malignant neoplasm of breast: Secondary | ICD-10-CM | POA: Insufficient documentation

## 2012-04-10 ENCOUNTER — Encounter: Payer: 59 | Admitting: Internal Medicine

## 2012-06-09 ENCOUNTER — Other Ambulatory Visit: Payer: Self-pay | Admitting: *Deleted

## 2012-06-09 DIAGNOSIS — I82409 Acute embolism and thrombosis of unspecified deep veins of unspecified lower extremity: Secondary | ICD-10-CM

## 2012-06-09 NOTE — Telephone Encounter (Signed)
Last appt 10/2011.

## 2012-06-12 NOTE — Telephone Encounter (Signed)
Message left for pt to schedule/keep anppt w/PCP and Dr Alexandria Lodge. Pharmacy also made awared .

## 2012-06-30 ENCOUNTER — Encounter (HOSPITAL_COMMUNITY): Payer: Self-pay

## 2012-06-30 ENCOUNTER — Emergency Department (HOSPITAL_COMMUNITY)
Admission: EM | Admit: 2012-06-30 | Discharge: 2012-06-30 | Disposition: A | Discharge status: Home / Self Care | Payer: 59 | Attending: Emergency Medicine | Admitting: Emergency Medicine

## 2012-06-30 DIAGNOSIS — IMO0001 Reserved for inherently not codable concepts without codable children: Secondary | ICD-10-CM | POA: Insufficient documentation

## 2012-06-30 DIAGNOSIS — N898 Other specified noninflammatory disorders of vagina: Secondary | ICD-10-CM | POA: Insufficient documentation

## 2012-06-30 DIAGNOSIS — Z79899 Other long term (current) drug therapy: Secondary | ICD-10-CM | POA: Insufficient documentation

## 2012-06-30 DIAGNOSIS — R51 Headache: Secondary | ICD-10-CM | POA: Insufficient documentation

## 2012-06-30 DIAGNOSIS — F17200 Nicotine dependence, unspecified, uncomplicated: Secondary | ICD-10-CM | POA: Insufficient documentation

## 2012-06-30 DIAGNOSIS — J029 Acute pharyngitis, unspecified: Secondary | ICD-10-CM

## 2012-06-30 DIAGNOSIS — E669 Obesity, unspecified: Secondary | ICD-10-CM | POA: Insufficient documentation

## 2012-06-30 DIAGNOSIS — R791 Abnormal coagulation profile: Secondary | ICD-10-CM

## 2012-06-30 DIAGNOSIS — R509 Fever, unspecified: Secondary | ICD-10-CM | POA: Insufficient documentation

## 2012-06-30 DIAGNOSIS — Z7901 Long term (current) use of anticoagulants: Secondary | ICD-10-CM | POA: Insufficient documentation

## 2012-06-30 DIAGNOSIS — N939 Abnormal uterine and vaginal bleeding, unspecified: Secondary | ICD-10-CM

## 2012-06-30 DIAGNOSIS — J3489 Other specified disorders of nose and nasal sinuses: Secondary | ICD-10-CM | POA: Insufficient documentation

## 2012-06-30 DIAGNOSIS — Z86718 Personal history of other venous thrombosis and embolism: Secondary | ICD-10-CM | POA: Insufficient documentation

## 2012-06-30 DIAGNOSIS — I10 Essential (primary) hypertension: Secondary | ICD-10-CM | POA: Insufficient documentation

## 2012-06-30 DIAGNOSIS — B3731 Acute candidiasis of vulva and vagina: Secondary | ICD-10-CM

## 2012-06-30 DIAGNOSIS — Z85048 Personal history of other malignant neoplasm of rectum, rectosigmoid junction, and anus: Secondary | ICD-10-CM | POA: Insufficient documentation

## 2012-06-30 DIAGNOSIS — Z86711 Personal history of pulmonary embolism: Secondary | ICD-10-CM | POA: Insufficient documentation

## 2012-06-30 DIAGNOSIS — B373 Candidiasis of vulva and vagina: Secondary | ICD-10-CM | POA: Insufficient documentation

## 2012-06-30 LAB — URINALYSIS, ROUTINE W REFLEX MICROSCOPIC
Bilirubin Urine: NEGATIVE
Ketones, ur: NEGATIVE mg/dL
Protein, ur: NEGATIVE mg/dL
Urobilinogen, UA: 1 mg/dL (ref 0.0–1.0)

## 2012-06-30 LAB — WET PREP, GENITAL: Trich, Wet Prep: NONE SEEN

## 2012-06-30 LAB — URINE MICROSCOPIC-ADD ON

## 2012-06-30 LAB — PROTIME-INR: INR: 1.04 (ref 0.00–1.49)

## 2012-06-30 MED ORDER — WARFARIN SODIUM 5 MG PO TABS
5.0000 mg | ORAL_TABLET | Freq: Once | ORAL | Status: AC
  Administered 2012-06-30: 5 mg via ORAL
  Filled 2012-06-30: qty 1

## 2012-06-30 MED ORDER — ENOXAPARIN SODIUM 120 MG/0.8ML ~~LOC~~ SOLN: 120.0000 mg | Freq: Two times a day (BID) | SUBCUTANEOUS | Status: DC

## 2012-06-30 MED ORDER — ENOXAPARIN SODIUM 100 MG/ML ~~LOC~~ SOLN
120.0000 mg | Freq: Once | SUBCUTANEOUS | Status: AC
  Administered 2012-06-30: 120 mg via SUBCUTANEOUS
  Filled 2012-06-30: qty 2

## 2012-06-30 MED ORDER — HYDROCODONE-ACETAMINOPHEN 5-325 MG PO TABS: 2.0000 | ORAL_TABLET | ORAL | Status: DC | PRN

## 2012-06-30 MED ORDER — FLUCONAZOLE 200 MG PO TABS: 200.0000 mg | ORAL_TABLET | Freq: Once | ORAL | Status: DC

## 2012-06-30 MED ORDER — FLUCONAZOLE 150 MG PO TABS
150.0000 mg | ORAL_TABLET | Freq: Once | ORAL | Status: AC
  Administered 2012-06-30: 150 mg via ORAL
  Filled 2012-06-30: qty 1

## 2012-06-30 NOTE — ED Notes (Signed)
Pt had flu shot yesterday and then started having fever and sore throat, temp is 99.0 in room, also reports lightheaded.

## 2012-06-30 NOTE — ED Provider Notes (Signed)
Medical screening examination/treatment/procedure(s) were performed by non-physician practitioner and as supervising physician I was immediately available for consultation/collaboration.   Richardean Canal, MD 06/30/12 [phone]

## 2012-06-30 NOTE — ED Notes (Signed)
Pt now report vaginal bleeding only yesterday and has stopped now. Sts she never had vaginal bleeding before.

## 2012-06-30 NOTE — ED Notes (Signed)
Pt is aware of the need for urine sample however states she is unable at this time.  

## 2012-06-30 NOTE — ED Provider Notes (Signed)
History     CSN: [id number]  Arrival date & time 06/30/12  1635   First MD Initiated Contact with Patient 06/30/12 2003      Chief Complaint  Patient presents with  . Vaginal Bleeding  . Fever  . Sore Throat    (Consider location/radiation/quality/duration/timing/severity/associated sxs/prior treatment) HPI History provided by pt.   Pt presents w/ multiple complaints.  She has had a sore throat since yesterday.  Associated w/ frontal headache, body aches and low grade fever (99.0).  Denies nasal congestion, rhinorrhea, cough, SOB, abd pain, N/V/D.  No known sick contacts.  Received the flu shot 2 days ago and she is curious if this is the etiology of her illness.  Also c/o vaginal bleeding yesterday evening.  Noticed when she wiped after urinating, but had some blood on her pad as well.  No bleeding today.  Has also had vulvar pruritis.  No urinary sx. Has had a partial hysterectomy.  Has a gynecologist.  Is anti-coagulated w/ coumadin.    Past Medical History  Diagnosis Date  . Sleep apnea   . Deep vein thrombosis     left lower extremity  . Pulmonary embolism   . Allergy   . Anemia   . Hypertension   . History of rectal cancer     T1N0 resected 08/2009  . Diverticulitis large intestine   . Obesity   . Infectious colitis   . Cancer     Past Surgical History  Procedure Date  . Abdominal hysterectomy   . Tubal ligation   . Transanal rectal resection 08/2009    T1N0 rectal cancer Dr. Michaell Cowing    Family History  Problem Relation Age of Onset  . Cancer Mother     unknown type but patient thinks it was bone marrow cancer  . Heart disease Sister   . Heart disease Brother   . Clotting disorder Brother     History  Substance Use Topics  . Smoking status: Current Every Day Smoker    Last Attempt to Quit: 12/29/2008  . Smokeless tobacco: Not on file  . Alcohol Use: No    OB History    Grav Para Term Preterm Abortions TAB SAB Ect Mult Living                   Review of Systems  All other systems reviewed and are negative.    Allergies  Aspirin  Home Medications   Current Outpatient Rx  Name Route Sig Dispense Refill  . ACETAMINOPHEN 500 MG PO TABS Oral Take 1,000 mg by mouth every 6 (six) hours as needed. pain    . HYDROCHLOROTHIAZIDE 25 MG PO TABS Oral Take 0.5 tablets (12.5 mg total) by mouth daily. Take half pill by mouth daily for blood pressure 30 tablet 11  . WARFARIN SODIUM 5 MG PO TABS Oral Take 5 mg by mouth daily. Takes 1 tablet(5mg ) on mon, tues, wed, fri, and saturda and takes 1and 1/2 tablets(7.5 mg)on Thursday and sunday      BP 145/69  Pulse 97  Temp 99 F (37.2 C) (Oral)  Resp 18  SpO2 99%  Physical Exam  Nursing note and vitals reviewed. Constitutional: She is oriented to person, place, and time. She appears well-developed and well-nourished. No distress.       obese  HENT:  Head: Normocephalic and atraumatic. No trismus in the jaw.  Mouth/Throat: Uvula is midline and mucous membranes are normal. No posterior oropharyngeal edema or posterior oropharyngeal  erythema.       Left and right TM appear nml.  Oropharynx clear but tonsils and posterior pharynx erythematous.  No exudate. Tonsils symmetric.  Uvula mid-line.  No trismus.  No nasal discharge.  Mild frontal sinus ttp.  Eyes:       Normal appearance  Neck: Normal range of motion. Neck supple.  Cardiovascular: Normal rate and regular rhythm.   Pulmonary/Chest: Effort normal and breath sounds normal.  Genitourinary:       Mild, diffuse vulvar erythema.  Pt has a cervix but I can not locate the cervical os d/t redundant vaginal tissue.  Small amt of thick, chunky, white vaginal discharge.  No vaginal bleeding.  No obvious lesion on vaginal wall.  No adnexal or cervical motion tenderness.   Musculoskeletal: Normal range of motion.  Lymphadenopathy:    She has no cervical adenopathy.  Neurological: She is alert and oriented to person, place, and time.        CN 3-12 intact.  No sensory deficits.  5/5 and equal upper and lower extremity strength.  No past pointing.  Nml gait.    Skin: Skin is warm and dry. No rash noted.  Psychiatric: She has a normal mood and affect. Her behavior is normal.    ED Course  Procedures (including critical care time)  Labs Reviewed  WET PREP, GENITAL - Abnormal; Notable for the following:    Yeast Wet Prep HPF POC FEW (*)     Clue Cells Wet Prep HPF POC MODERATE (*)     WBC, Wet Prep HPF POC MANY (*)     All other components within normal limits  URINALYSIS, ROUTINE W REFLEX MICROSCOPIC - Abnormal; Notable for the following:    Hgb urine dipstick SMALL (*)     All other components within normal limits  URINE MICROSCOPIC-ADD ON - Abnormal; Notable for the following:    Squamous Epithelial / LPF FEW (*)     All other components within normal limits  RAPID STREP SCREEN  PROTIME-INR  GC/CHLAMYDIA PROBE AMP, GENITAL   No results found.   1. Subtherapeutic international normalized ratio (INR)   2. Vaginal bleeding   3. Candidiasis of the genitals, female   4. Viral pharyngitis       MDM  56yo F, anti-coagulated w/ coumadin for h/o DVT/PE, presents w/ c/o sore throat and vaginal bleeding, s/p partial hysterectomy several years ago.  Strep screen obtained because of absence of cough/coryza.  No vaginal bleeding or vaginal wall lesions on exam.  There is however, chunky, white vaginal discharge and mild vulvar erythema that could suggest a yeast infection.  PT INR as well as U/A to r/o urinary tract as source of bleeding are pending.    INR 1.09.  Her home dose of coumadin as well as Lovenox ordered/prescribed. Pt instructed to contact her PCP on Monday morning.  Wet prep pos for few yeast and moderate clue cells.  Pt received po Diflucan in ED.  Will not treat BV because asymptomatic and may interact with her coumadin.  U/A and strep screen neg.  Prescribed short course of vicodin for throat pain (she can not  take NSAIDs).  Advised to f/u with her gyn asap for vaginal bleeding and return if she has heavy bleeding or develops SOB, CP or LE pain/edema.       Otilio Miu, [place] 06/30/12 [phone]

## 2012-07-01 LAB — GC/CHLAMYDIA PROBE AMP, GENITAL: GC Probe Amp, Genital: NEGATIVE

## 2012-07-03 ENCOUNTER — Ambulatory Visit (INDEPENDENT_AMBULATORY_CARE_PROVIDER_SITE_OTHER): Payer: 59 | Admitting: Pharmacist

## 2012-07-03 DIAGNOSIS — Z7901 Long term (current) use of anticoagulants: Secondary | ICD-10-CM

## 2012-07-03 DIAGNOSIS — I82409 Acute embolism and thrombosis of unspecified deep veins of unspecified lower extremity: Secondary | ICD-10-CM

## 2012-07-03 MED ORDER — WARFARIN SODIUM 5 MG PO TABS: ORAL_TABLET | ORAL | Status: DC

## 2012-07-03 NOTE — Patient Instructions (Signed)
Patient instructed to take medications as defined in the Anti-coagulation Track section of this encounter.  Patient instructed to take today's dose.  Patient verbalized understanding of these instructions.  Patient instructed to CONTINUE LOVENOX syringes/injections as she as been doing since seen in ED. Patient was instructed to go to her pharmacy and pick up her warfarin supply that is being sent as an electronic prescription.

## 2012-07-03 NOTE — Progress Notes (Signed)
Anti-Coagulation Progress Note  Alicia Martinez is a 56 y.o. female who is currently on an anti-coagulation regimen.    RECENT RESULTS: Recent results are below, the most recent result is correlated with a dose of 40 mg. per week: Lab Results  Component Value Date   INR 1.0 07/03/2012   INR 1.04 06/30/2012   INR 2.30 11/01/2011    ANTI-COAG DOSE:   Latest dosing instructions   Total Sun Mon Tue Wed Thu Fri Sat   42.5 5 mg 7.5 mg 5 mg 7.5 mg 5 mg 7.5 mg 5 mg    (5 mg1) (5 mg1.5) (5 mg1) (5 mg1.5) (5 mg1) (5 mg1.5) (5 mg1)         ANTICOAG SUMMARY: Anticoagulation Episode Summary              Current INR goal 2.0-3.0 Next INR check 07/10/2012   INR from last check 1.0! (07/03/2012)     Weekly max dose (mg)  Target end date Indefinite   Indications DEEP VENOUS THROMBOPHLEBITIS, RECURRENT, Long term current use of anticoagulant   [facility]R check location [facility] lab    Send INR reminders to ANTICOAG IMP   Comments        Provider Role Specialty Phone number   Burns Spain, MD  Internal Medicine [phone]        ANTICOAG TODAY: Anticoagulation Summary as of 07/03/2012              INR goal 2.0-3.0     Selected INR 1.0! (07/03/2012) Next INR check 07/10/2012   Weekly max dose (mg)  Target end date Indefinite   Indications DEEP VENOUS THROMBOPHLEBITIS, RECURRENT, Long term current use of anticoagulant    Anticoagulation Episode Summary              INR check location [facility] Preferred lab    Send INR reminders to ANTICOAG IMP   Comments        Provider Role Specialty Phone number   Burns Spain, MD  Internal Medicine [phone]        PATIENT INSTRUCTIONS: Patient Instructions  Patient instructed to take medications as defined in the Anti-coagulation Track section of this encounter.  Patient instructed to take today's dose.  Patient verbalized understanding of these instructions.  Patient instructed to CONTINUE  LOVENOX syringes/injections as she as been doing since seen in ED. Patient was instructed to go to her pharmacy and pick up her warfarin supply that is being sent as an electronic prescription.      FOLLOW-UP Return in 7 days (on 07/10/2012) for Follow up INR at 1130h.  [name], [name] Pharm.D., CACP

## 2012-07-03 NOTE — Progress Notes (Signed)
Agree with plan 

## 2012-07-10 ENCOUNTER — Ambulatory Visit (INDEPENDENT_AMBULATORY_CARE_PROVIDER_SITE_OTHER): Payer: 59 | Admitting: Pharmacist

## 2012-07-10 DIAGNOSIS — Z7901 Long term (current) use of anticoagulants: Secondary | ICD-10-CM

## 2012-07-10 DIAGNOSIS — I82409 Acute embolism and thrombosis of unspecified deep veins of unspecified lower extremity: Secondary | ICD-10-CM

## 2012-07-10 NOTE — Patient Instructions (Signed)
Patient instructed to take medications as defined in the Anti-coagulation Track section of this encounter.  Patient instructed to take today's dose.  Patient verbalized understanding of these instructions.    

## 2012-07-17 ENCOUNTER — Emergency Department (HOSPITAL_COMMUNITY): Payer: 59

## 2012-07-17 ENCOUNTER — Encounter (HOSPITAL_COMMUNITY): Payer: Self-pay | Admitting: Emergency Medicine

## 2012-07-17 ENCOUNTER — Emergency Department (HOSPITAL_COMMUNITY)
Admission: EM | Admit: 2012-07-17 | Discharge: 2012-07-18 | Disposition: A | Discharge status: Home / Self Care | Payer: 59 | Attending: Emergency Medicine | Admitting: Emergency Medicine

## 2012-07-17 DIAGNOSIS — Z862 Personal history of diseases of the blood and blood-forming organs and certain disorders involving the immune mechanism: Secondary | ICD-10-CM | POA: Insufficient documentation

## 2012-07-17 DIAGNOSIS — I10 Essential (primary) hypertension: Secondary | ICD-10-CM | POA: Insufficient documentation

## 2012-07-17 DIAGNOSIS — J40 Bronchitis, not specified as acute or chronic: Secondary | ICD-10-CM

## 2012-07-17 DIAGNOSIS — Z85038 Personal history of other malignant neoplasm of large intestine: Secondary | ICD-10-CM | POA: Insufficient documentation

## 2012-07-17 DIAGNOSIS — R509 Fever, unspecified: Secondary | ICD-10-CM | POA: Insufficient documentation

## 2012-07-17 DIAGNOSIS — F17200 Nicotine dependence, unspecified, uncomplicated: Secondary | ICD-10-CM | POA: Insufficient documentation

## 2012-07-17 DIAGNOSIS — R42 Dizziness and giddiness: Secondary | ICD-10-CM | POA: Insufficient documentation

## 2012-07-17 DIAGNOSIS — Z7901 Long term (current) use of anticoagulants: Secondary | ICD-10-CM | POA: Insufficient documentation

## 2012-07-17 DIAGNOSIS — Z86711 Personal history of pulmonary embolism: Secondary | ICD-10-CM | POA: Insufficient documentation

## 2012-07-17 DIAGNOSIS — R5381 Other malaise: Secondary | ICD-10-CM | POA: Insufficient documentation

## 2012-07-17 DIAGNOSIS — J209 Acute bronchitis, unspecified: Secondary | ICD-10-CM | POA: Insufficient documentation

## 2012-07-17 DIAGNOSIS — Z8679 Personal history of other diseases of the circulatory system: Secondary | ICD-10-CM | POA: Insufficient documentation

## 2012-07-17 DIAGNOSIS — J329 Chronic sinusitis, unspecified: Secondary | ICD-10-CM | POA: Insufficient documentation

## 2012-07-17 MED ORDER — ACETAMINOPHEN 500 MG PO TABS
1000.0000 mg | ORAL_TABLET | Freq: Once | ORAL | Status: AC
  Administered 2012-07-17: 1000 mg via ORAL
  Filled 2012-07-17: qty 2

## 2012-07-17 NOTE — ED Notes (Signed)
Patient transported to X-ray 

## 2012-07-17 NOTE — ED Notes (Signed)
Patient got a flu shot 2 weeks ago, and recently c/o fever, chills, body aches, and cough.

## 2012-07-17 NOTE — ED Provider Notes (Signed)
History     CSN: [id number]  Arrival date & time 07/17/12  1755   First MD Initiated Contact with Patient 07/17/12 2301      Chief Complaint  Patient presents with  . Muscle Pain    (Consider location/radiation/quality/duration/timing/severity/associated sxs/prior treatment) HPI Comments: Patient is a 56 year old female who presents with a 2 week history of sinus congestion. She reports gradual onset after she got her flu shot 2 weeks ago and progressive worsening since the onset. She reports associated fever up to 102, chills, dizziness, general malaise and occasional cough. She denies sick contacts. She has tried benadryl, soup, and tea without symptom relief. She denies aggravating/alleviating symptoms. She denies headache, NVD, abdominal pain, chest pain, SOB.    Past Medical History  Diagnosis Date  . Sleep apnea   . Deep vein thrombosis     left lower extremity  . Pulmonary embolism   . Allergy   . Anemia   . Hypertension   . History of rectal cancer     T1N0 resected 08/2009  . Diverticulitis large intestine   . Obesity   . Infectious colitis   . Cancer     Past Surgical History  Procedure Date  . Abdominal hysterectomy   . Tubal ligation   . Transanal rectal resection 08/2009    T1N0 rectal cancer Dr. Michaell Cowing    Family History  Problem Relation Age of Onset  . Cancer Mother     unknown type but patient thinks it was bone marrow cancer  . Heart disease Sister   . Heart disease Brother   . Clotting disorder Brother     History  Substance Use Topics  . Smoking status: Current Every Day Smoker    Last Attempt to Quit: 12/29/2008  . Smokeless tobacco: Not on file  . Alcohol Use: No    OB History    Grav Para Term Preterm Abortions TAB SAB Ect Mult Living                  Review of Systems  Constitutional: Positive for fever, chills and fatigue.  HENT: Positive for sinus pressure.   Neurological: Positive for dizziness.  All other systems  reviewed and are negative.    Allergies  Aspirin  Home Medications   Current Outpatient Rx  Name Route Sig Dispense Refill  . ACETAMINOPHEN 500 MG PO TABS Oral Take 1,000 mg by mouth every 6 (six) hours as needed. pain    . HYDROCHLOROTHIAZIDE 25 MG PO TABS Oral Take 0.5 tablets (12.5 mg total) by mouth daily. Take half pill by mouth daily for blood pressure 30 tablet 11  . HYDROCODONE-ACETAMINOPHEN 5-325 MG PO TABS Oral Take 2 tablets by mouth every 4 (four) hours as needed for pain. 10 tablet 0  . WARFARIN SODIUM 5 MG PO TABS Oral Take 5-7.5 mg by mouth daily. Take 7.5 mg Monday, Tuesday, Wednesday, Thursday, Friday, then 5 mg on Saturday and Sunday      BP 153/83  Pulse 118  Temp 100.8 F (38.2 C) (Oral)  Resp 20  SpO2 95%  Physical Exam  Nursing note and vitals reviewed. Constitutional: She is oriented to person, place, and time. She appears well-developed and well-nourished. No distress.  HENT:  Head: Normocephalic and atraumatic.  Mouth/Throat: Oropharynx is clear and moist. No oropharyngeal exudate.       Maxillary sinuses tender to palpation.   Eyes: Conjunctivae normal and EOM are normal. Pupils are equal, round, and reactive  to light. No scleral icterus.  Neck: Normal range of motion. Neck supple.  Cardiovascular: Normal rate and regular rhythm.  Exam reveals no gallop and no friction rub.   No murmur heard. Pulmonary/Chest: Effort normal. She has wheezes. She has no rales. She exhibits no tenderness.       Occasional wheezes noted bilaterally.   Abdominal: Soft. She exhibits no distension. There is no tenderness.  Musculoskeletal: Normal range of motion.  Lymphadenopathy:    She has no cervical adenopathy.  Neurological: She is alert and oriented to person, place, and time. Coordination normal.       Speech is goal-oriented. Moves limbs without ataxia.   Skin: Skin is warm and dry. She is not diaphoretic.  Psychiatric: She has a normal mood and affect. Her  behavior is normal.    ED Course  Procedures (including critical care time)  Labs Reviewed - No data to display Dg Chest 2 View  07/18/2012  *RADIOLOGY REPORT*  Clinical Data: Fever, body aches.  CHEST - 2 VIEW  Comparison: 01/26/2011 PET-CT, 05/05/2009 chest radiograph  Findings: Cardiomediastinal contours are unchanged, within normal limits.  Lungs are predominately clear.  Mild central bronchitic change without confluent airspace opacity, pleural effusion, or pneumothorax.  No acute osseous finding.  IMPRESSION: Mild bronchitic change can be seen with bronchitis or a viral process.  No confluent airspace opacity.   Original Report Authenticated By: Waneta Martins, M.D.      1. Sinusitis   2. Bronchitis       MDM  11:47 PM Patient given tylenol for fever. Patient will have chest xray to rule out infectious process.   1:23 AM Chest xray negative. Patient discharged with Augmentin for Sinusitis and Bronchitis. Patient instructed to take antibiotics until gone, rest and return to the ED with worsening or concerning symptoms.         Emilia Beck, PA-C 07/18/12 [phone]

## 2012-07-17 NOTE — ED Notes (Signed)
Pt and friend at bedside reports pt received a flu shot x2 weeks ago and was not feeling well shortly after. Pt here tonight for same symptoms - generalize malaise, HA, fever.

## 2012-07-18 MED ORDER — AMOXICILLIN-POT CLAVULANATE 875-125 MG PO TABS
1.0000 | ORAL_TABLET | Freq: Once | ORAL | Status: AC
  Administered 2012-07-18: 1 via ORAL
  Filled 2012-07-18: qty 1

## 2012-07-18 MED ORDER — AMOXICILLIN-POT CLAVULANATE 875-125 MG PO TABS: 1.0000 | ORAL_TABLET | Freq: Two times a day (BID) | ORAL | Status: DC

## 2012-07-18 NOTE — ED Notes (Signed)
Rx given x1 Pt ambulating independently w/ steady gait on d/c in no acute distress, A&Ox4. D/c instructions reviewed w/ pt and family - pt and family deny any further questions or concerns at present.  

## 2012-07-20 NOTE — ED Provider Notes (Signed)
Medical screening examination/treatment/procedure(s) were performed by non-physician practitioner and as supervising physician I was immediately available for consultation/collaboration.  Dayzee Trower T Rual Vermeer, MD 07/20/12 2230 

## 2012-08-07 ENCOUNTER — Ambulatory Visit (INDEPENDENT_AMBULATORY_CARE_PROVIDER_SITE_OTHER): Payer: 59 | Admitting: Pharmacist

## 2012-08-07 ENCOUNTER — Encounter: Payer: Self-pay | Admitting: Internal Medicine

## 2012-08-07 ENCOUNTER — Ambulatory Visit (INDEPENDENT_AMBULATORY_CARE_PROVIDER_SITE_OTHER): Payer: 59 | Admitting: Internal Medicine

## 2012-08-07 VITALS — BP 133/82 | HR 96 | Temp 98.6°F | Ht 60.0 in | Wt 266.5 lb

## 2012-08-07 DIAGNOSIS — G4733 Obstructive sleep apnea (adult) (pediatric): Secondary | ICD-10-CM

## 2012-08-07 DIAGNOSIS — I82409 Acute embolism and thrombosis of unspecified deep veins of unspecified lower extremity: Secondary | ICD-10-CM

## 2012-08-07 DIAGNOSIS — Z7901 Long term (current) use of anticoagulants: Secondary | ICD-10-CM

## 2012-08-07 DIAGNOSIS — G4730 Sleep apnea, unspecified: Secondary | ICD-10-CM

## 2012-08-07 DIAGNOSIS — J329 Chronic sinusitis, unspecified: Secondary | ICD-10-CM | POA: Insufficient documentation

## 2012-08-07 DIAGNOSIS — F17200 Nicotine dependence, unspecified, uncomplicated: Secondary | ICD-10-CM

## 2012-08-07 DIAGNOSIS — I10 Essential (primary) hypertension: Secondary | ICD-10-CM

## 2012-08-07 DIAGNOSIS — Z85048 Personal history of other malignant neoplasm of rectum, rectosigmoid junction, and anus: Secondary | ICD-10-CM

## 2012-08-07 LAB — CBC
Hemoglobin: 12.9 g/dL (ref 12.0–15.0)
MCH: 29.7 pg (ref 26.0–34.0)
MCHC: 33.9 g/dL (ref 30.0–36.0)
MCV: 87.4 fL (ref 78.0–100.0)
Platelets: 325 10*3/uL (ref 150–400)

## 2012-08-07 LAB — POCT INR: INR: 3.5

## 2012-08-07 NOTE — Progress Notes (Signed)
Anti-Coagulation Progress Note  Alicia Martinez is a 56 y.o. female who is currently on an anti-coagulation regimen.    RECENT RESULTS: Recent results are below, the most recent result is correlated with a dose of 47.5 mg. per week. Though documented she was to have been on 52.5mg /wk--she endorses having only been taking 47.5mg /wk: Lab Results  Component Value Date   INR 3.50 08/07/2012   INR 2.00 07/10/2012   INR 2.00 07/10/2012   INR 1.0 07/03/2012    ANTI-COAG DOSE:   Latest dosing instructions   Total Sun Mon Tue Wed Thu Fri Sat   45 7.5 mg 5 mg 7.5 mg 5 mg 7.5 mg 5 mg 7.5 mg    (5 mg1.5) (5 mg1) (5 mg1.5) (5 mg1) (5 mg1.5) (5 mg1) (5 mg1.5)         ANTICOAG SUMMARY: Anticoagulation Episode Summary              Current INR goal 2.0-3.0 Next INR check 08/28/2012   INR from last check 3.50! (08/07/2012)     Weekly max dose (mg)  Target end date Indefinite   Indications DEEP VENOUS THROMBOPHLEBITIS, RECURRENT [453.40], Long term current use of anticoagulant [V58.61]   INR check location [facility] Preferred lab    Send INR reminders to ANTICOAG IMP   Comments        Provider Role Specialty Phone number   Burns Spain, MD  Internal Medicine [phone]        ANTICOAG TODAY: Anticoagulation Summary as of 08/07/2012              INR goal 2.0-3.0     Selected INR 3.50! (08/07/2012) Next INR check 08/28/2012   Weekly max dose (mg)  Target end date Indefinite   Indications DEEP VENOUS THROMBOPHLEBITIS, RECURRENT [453.40], Long term current use of anticoagulant [V58.61]    Anticoagulation Episode Summary              INR check location [facility] Preferred lab    Send INR reminders to ANTICOAG IMP   Comments        Provider Role Specialty Phone number   Burns Spain, MD  Internal Medicine [phone]        PATIENT INSTRUCTIONS: Patient Instructions  Patient instructed to take medications as defined in the Anti-coagulation  Track section of this encounter.  Patient instructed to take today's dose.  Patient verbalized understanding of these instructions.        FOLLOW-UP Return in 3 weeks (on 08/28/2012) for Follow up INR at 4PM.  [name], [name] Pharm.D., CACP

## 2012-08-07 NOTE — Progress Notes (Signed)
Subjective:   Patient ID: Alicia Martinez female   DOB: 1955-10-22 56 y.o.   MRN: [id number]  HPI: 56 year old woman with past medical history significant for PE and DVT on long-term anticoagulation, history of rectal cancer status post resection in 2010 presents to the clinic for followup visit.  She was seen in ER  on 10/21 /13 for sinusitis and probable bronchitis and was given a ten-day course of Augmentin. She feels much better today- denies any cough, fever, chills, chest pain, nausea or vomiting.  States that her sleeping habits are getting worse- reports excessive day time sleepiness. Reports that her husband reports that she snores heavily in nights. States that her sinusitis is better but she feels sinuses get full at nights. She did report having a sleep study a while ago and at that time she was advised adenoidectomy. She does not use any CPAP at home.   Past Medical History  Diagnosis Date  . Sleep apnea   . Deep vein thrombosis     left lower extremity  . Pulmonary embolism   . Allergy   . Anemia   . Hypertension   . History of rectal cancer     T1N0 resected 08/2009  . Diverticulitis large intestine   . Obesity   . Infectious colitis   . Cancer    Family History  Problem Relation Age of Onset  . Cancer Mother     unknown type but patient thinks it was bone marrow cancer  . Heart disease Sister   . Heart disease Brother   . Clotting disorder Brother    History   Social History  . Marital Status: Single    Spouse Name: N/A    Number of Children: N/A  . 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 Smoking status: Current Every Day Smoker    Last Attempt to Quit: 12/29/2008  . Smokeless tobacco: Not on file  . Alcohol Use: No  . Drug Use: No  . Sexually Active:    Other Topics Concern  . Not on file   Social History Narrative   Patient does not regular exercise. He has three boys. Daily  caffeine use 2/day.   Review of Systems: General: Denies fever, chills, diaphoresis, appetite change and fatigue. HEENT: Denies photophobia, eye pain, redness, hearing loss, ear pain, congestion, sore throat, rhinorrhea, sneezing, mouth sores, trouble swallowing, neck pain, neck stiffness and tinnitus. Respiratory: Denies SOB, DOE, cough, chest tightness, and wheezing. Cardiovascular: Denies to chest pain, palpitations and leg swelling. Gastrointestinal: Denies nausea, vomiting, abdominal pain, diarrhea, constipation, blood in stool and abdominal distention. Genitourinary: Denies dysuria, urgency, frequency, hematuria, flank pain and difficulty urinating. Musculoskeletal: Denies myalgias, back pain, joint swelling, arthralgias and gait problem.  Skin: Denies pallor, rash and wound. Neurological: Denies dizziness, seizures, syncope, weakness, light-headedness, numbness and headaches. Hematological: Denies adenopathy, easy bruising, personal or family bleeding history. Psychiatric/Behavioral: Denies suicidal ideation, mood changes, confusion, nervousness, sleep disturbance and agitation.    Current Outpatient Medications: Current Outpatient Prescriptions  Medication Sig Dispense Refill  . acetaminophen (TYLENOL) 500 MG tablet Take 1,000 mg by mouth every 6 (six) hours as needed. pain      . amoxicillin-clavulanate (AUGMENTIN) 875-125 MG per tablet Take 1 tablet by mouth every 12 (twelve) hours.  20 tablet  0  . hydrochlorothiazide (HYDRODIURIL) 25 MG tablet Take 0.5 tablets (12.5 mg total) by mouth daily. Take half pill by  mouth daily for blood pressure  30 tablet  11  . HYDROcodone-acetaminophen (NORCO/VICODIN) 5-325 MG per tablet Take 2 tablets by mouth every 4 (four) hours as needed for pain.  10 tablet  0  . warfarin (COUMADIN) 5 MG tablet Take 5-7.5 mg by mouth daily. Take 7.5 mg Monday, Tuesday, Wednesday, Thursday, Friday, then 5 mg on Saturday and Sunday        Allergies: Allergies    Allergen Reactions  . Aspirin     REACTION: can not take due to coumadin      Objective:   Physical Exam: Filed Vitals:   08/07/12 1626  BP: 133/82  Pulse: 96  Temp: 98.6 F (37 C)    General: Vital signs reviewed and noted. Well-developed, well-nourished, in no acute distress; alert, appropriate and cooperative throughout examination. Head: Normocephalic, atraumatic Lungs: Normal respiratory effort. Clear to auscultation BL without crackles or wheezes. Heart: RRR. S1 and S2 normal without gallop, murmur, or rubs. Abdomen:BS normoactive. Soft, Nondistended, non-tender.  No masses or organomegaly. Extremities: No pretibial edema.     Assessment & Plan:

## 2012-08-07 NOTE — Patient Instructions (Signed)
Please schedule a follow up appointment in 3 months . Please bring your medication bottles with your next appointment. Please take your medicines as prescribed. I will call you with your lab results if anything will be abnormal. 

## 2012-08-07 NOTE — Assessment & Plan Note (Signed)
ER follow up sinusitis - reports feeling much better but at time she feels her sinuses are full at night. Completed her course of Augmentin that was prescribed for 10 days at ER discharge.  -Advised steam inhalation as needed when her sinuses flare up.

## 2012-08-07 NOTE — Assessment & Plan Note (Signed)
Patient was seen by Dr. Alexandria Lodge for management of her long term anticoagulation.

## 2012-08-07 NOTE — Assessment & Plan Note (Signed)
She smokes 4 cigs/day. States that her ex husband and brother died this year. Advised her on tobacco cessation. She states that she has tried 1-800 - QUIT - LINE but they did not help her much.  Offered her nicotine gums/patches- States that her insurance company does not pay for that.

## 2012-08-07 NOTE — Assessment & Plan Note (Signed)
Lab Results  Component Value Date   NA 139 11/01/2011   K 3.8 11/01/2011   CL 102 11/01/2011   CO2 28 11/01/2011   BUN 15 11/01/2011   CREATININE 0.76 11/01/2011   CREATININE 0.62 01/16/2011    BP Readings from Last 3 Encounters:  08/07/12 133/82  07/18/12 125/68  06/30/12 145/69    Assessment: Hypertension control:  controlled  Progress toward goals:  at goal Barriers to meeting goals:  no barriers identified  Plan: Hypertension treatment:  continue current medications

## 2012-08-07 NOTE — Patient Instructions (Signed)
Patient instructed to take medications as defined in the Anti-coagulation Track section of this encounter.  Patient instructed to take today's dose.  Patient verbalized understanding of these instructions.    

## 2012-08-08 LAB — COMPLETE METABOLIC PANEL WITH GFR
ALT: 70 U/L — ABNORMAL HIGH (ref 0–35)
CO2: 28 mEq/L (ref 19–32)
Calcium: 9.3 mg/dL (ref 8.4–10.5)
Chloride: 103 mEq/L (ref 96–112)
GFR, Est African American: >89 mL/min
Sodium: 136 mEq/L (ref 135–145)
Total Protein: 7 g/dL (ref 6.0–8.3)

## 2012-08-09 DIAGNOSIS — G4730 Sleep apnea, unspecified: Secondary | ICD-10-CM

## 2012-08-09 DIAGNOSIS — G4733 Obstructive sleep apnea (adult) (pediatric): Secondary | ICD-10-CM | POA: Insufficient documentation

## 2012-08-09 HISTORY — DX: Sleep apnea, unspecified: G47.30

## 2012-08-09 NOTE — Assessment & Plan Note (Signed)
Patient reports having excessive daytime sleepiness and snoring at night. She states that she has had sleep study in the past but we couldn't find it in our records. - Get sleep study.

## 2012-08-28 ENCOUNTER — Ambulatory Visit: Payer: 59

## 2012-09-04 ENCOUNTER — Other Ambulatory Visit: Payer: 59

## 2012-09-14 ENCOUNTER — Ambulatory Visit (HOSPITAL_BASED_OUTPATIENT_CLINIC_OR_DEPARTMENT_OTHER): Payer: BC Managed Care – PPO | Attending: Internal Medicine

## 2012-09-14 VITALS — Ht 66.0 in | Wt 250.0 lb

## 2012-09-14 DIAGNOSIS — G4730 Sleep apnea, unspecified: Secondary | ICD-10-CM

## 2012-09-14 DIAGNOSIS — G4733 Obstructive sleep apnea (adult) (pediatric): Secondary | ICD-10-CM | POA: Insufficient documentation

## 2012-09-16 DIAGNOSIS — G4733 Obstructive sleep apnea (adult) (pediatric): Secondary | ICD-10-CM

## 2012-09-16 NOTE — Procedures (Cosign Needed)
[name], Alicia Martinez           ACCOUNT NO.:  [id number]  MEDICAL RECORD NO.:  [id number]          PATIENT TYPE:  OUT  LOCATION:  [facility]                 FACILITY:  [facility]  PHYSICIAN:  Clinton D. Maple Hudson, MD, FCCP, FACPDATE OF BIRTH:  July 19[birth year] 1958  DATE OF STUDY:  09/14/2012                           NOCTURNAL POLYSOMNOGRAM  REFERRING PHYSICIAN:  STEWART ROGERS  REFERRING PHYSICIAN:  C. Ulyess Mort, MD  INDICATION FOR STUDY:  Hypersomnia with sleep apnea.  EPWORTH SLEEPINESS SCORE:  22/24.  BMI 40.  Weight 250 pounds, height 66 inches, neck 15 inches.  MEDICATIONS:  Home medications are charted and reviewed.  SLEEP ARCHITECTURE:  Split study protocol.  During the diagnostic phase, total sleep time 128.5 minutes with sleep efficiency 84.5%.  Stage I was 7.8%, stage II 92.2%, stages [name] and REM were absent.  Sleep latency 14 minutes, awake after sleep onset 9.5 minutes.  Arousal index 21.9.  BEDTIME MEDICATION:  None.  RESPIRATORY DATA:  Split study protocol.  Apnea/hypopnea index (AHI) 88.7 per hour.  A total of 191 events was scored including 104 obstructive apneas and 86 hypopneas.  Events were associated with nonsupine sleep position.  CPAP was titrated to 15 CWP, AHI 0 per hour. She wore a medium ResMed Quattro FX full-face mask with heated humidifier.  OXYGEN DATA:  Moderate snoring with oxygen desaturation to a nadir of 84% on room air.  With CPAP titration, snoring was prevented and mean oxygen saturation held 93% on room air.  CARDIAC DATA:  Normal sinus rhythm.  MOVEMENT/PARASOMNIA:  No significant movement disturbance.  Bathroom x1.  IMPRESSION/RECOMMENDATION: 1. Severe obstructive sleep apnea/hypopnea syndrome, AHI 88.7 per hour     with non-supine events.  Moderate snoring with oxygen desaturation     to a nadir of 84% on room air. 2. Successful CPAP titration to 15 CWP, AHI 0 per hour.  She wore a     medium ResMed Mirage Quattro full-face mask with  heated humidifier.     Snoring was prevented and mean oxygen saturation held 93% on room     air.     Clinton D. Maple Hudson, MD, [facility], FACP Diplomate, American Board of Sleep Medicine    CDY/MEDQ  D:  09/16/2012 12:48:06  T:  09/16/2012 19:47:40  Job:  [id number]

## 2012-10-31 ENCOUNTER — Ambulatory Visit (INDEPENDENT_AMBULATORY_CARE_PROVIDER_SITE_OTHER): Payer: BC Managed Care – PPO | Admitting: Internal Medicine

## 2012-10-31 ENCOUNTER — Encounter: Payer: Self-pay | Admitting: Internal Medicine

## 2012-10-31 VITALS — BP 154/87 | HR 78 | Temp 97.5°F | Wt 266.6 lb

## 2012-10-31 DIAGNOSIS — I10 Essential (primary) hypertension: Secondary | ICD-10-CM

## 2012-10-31 DIAGNOSIS — Z0000 Encounter for general adult medical examination without abnormal findings: Secondary | ICD-10-CM | POA: Insufficient documentation

## 2012-10-31 DIAGNOSIS — L299 Pruritus, unspecified: Secondary | ICD-10-CM

## 2012-10-31 DIAGNOSIS — G4730 Sleep apnea, unspecified: Secondary | ICD-10-CM

## 2012-10-31 DIAGNOSIS — Z7901 Long term (current) use of anticoagulants: Secondary | ICD-10-CM

## 2012-10-31 DIAGNOSIS — Z23 Encounter for immunization: Secondary | ICD-10-CM

## 2012-10-31 DIAGNOSIS — F17200 Nicotine dependence, unspecified, uncomplicated: Secondary | ICD-10-CM

## 2012-10-31 DIAGNOSIS — Z720 Tobacco use: Secondary | ICD-10-CM

## 2012-10-31 LAB — POCT INR: INR: 4.4

## 2012-10-31 MED ORDER — HYDROCHLOROTHIAZIDE 25 MG PO TABS: 12.5000 mg | ORAL_TABLET | Freq: Every day | ORAL | Status: DC

## 2012-10-31 MED ORDER — BUPROPION HCL ER (SMOKING DET) 150 MG PO TB12: 150.0000 mg | ORAL_TABLET | Freq: Two times a day (BID) | ORAL | Status: DC

## 2012-10-31 MED ORDER — DIPHENHYDRAMINE HCL 50 MG PO TABS: 25.0000 mg | ORAL_TABLET | Freq: Three times a day (TID) | ORAL | Status: DC | PRN

## 2012-10-31 NOTE — Assessment & Plan Note (Signed)
Patient had sleep study in December 2013 that showed AHI of around 88 per hour. She had CPAP titration done as well. [facility] for the machine to be delivered to the patient.

## 2012-10-31 NOTE — Assessment & Plan Note (Signed)
BP Readings from Last 3 Encounters:  10/31/12 154/87  08/07/12 133/82  07/18/12 125/68    Lab Results  Component Value Date   NA 136 08/07/2012   K 3.8 08/07/2012   CREATININE 0.77 08/07/2012    Assessment:  Blood pressure control: mildly elevated  Progress toward BP goal:  deteriorated  Comments: Blood pressure is mildly elevated today. She was advised to keep a log of blood pressure at home and get some readings with next visit if her blood pressure persists to be elevated at future visits would increase HCTZ from 12.5-25 mg.  Plan:  Medications:  continue current medications  Educational resources provided: handout  Self management tools provided: home blood pressure logbook  Other plans: She was encouraged to eat low-salt.

## 2012-10-31 NOTE — Assessment & Plan Note (Signed)
She got a tetanus shot today. She does not need pap smear given her hysterectomy. Up to date on mammogram.

## 2012-10-31 NOTE — Patient Instructions (Addendum)
General Instructions: Please schedule a follow up appointment in 3 months or sooner if needed . Please bring your medication bottles with your next appointment. Please take your medicines as prescribed. I will call you with your lab results if anything will be abnormal. Please get a blood pressure log with your next appt.     Treatment Goals:  Goals (1 Years of Data) as of 10/31/2012    None      Progress Toward Treatment Goals:  Treatment Goal 10/31/2012  Blood pressure deteriorated  Stop smoking smoking the same amount    Self Care Goals & Plans:  Self Care Goal 10/31/2012  Manage my medications take my medicines as prescribed; bring my medications to every visit; refill my medications on time; follow the sick day instructions if I am sick  Monitor my health keep track of my weight  Eat healthy foods eat more vegetables; drink diet soda or water instead of juice or soda; eat fruit for snacks and desserts; eat baked foods instead of fried foods  Be physically active find an activity I enjoy  Stop smoking set a quit date and stop smoking       Care Management & Community Referrals:  Referral 10/31/2012  Referrals made for care management support none needed

## 2012-10-31 NOTE — Assessment & Plan Note (Signed)
She seems to be motivated to quit smoking. She states that Chantix and nicotine patches and not covered by her insurance. Would try bupropion for her. She was also counseled to pick up a quit date and try to quit smoking altogether on the day.

## 2012-10-31 NOTE — Assessment & Plan Note (Signed)
Her INR was 4.4 today. Patient reports having an episode of epistaxis last week. Denies any other episodes of blood loss. -She was advised to hold Coumadin today. Instead of 7.5 mg of Coumadin for Wednesday, Thursday and Friday ,she was advised to take 5 mg of Coumadin for rest of the week, till Sunday and followup with Dr. Alexandria Lodge on Monday. She verbalizes understanding.

## 2012-10-31 NOTE — Progress Notes (Signed)
Subjective:   Patient ID: Alicia Martinez female   DOB: [birth year] 57 y.o.   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  She recently underwent sleep study and was diagnosed with sleep apnea with AHI of 88.8 per hour. She had CPAP titration but did not get the machine and continues to complain of difficulty sleeping.  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  Blood pressure was mildly elevated with today's visit. She reports that her blood pressure at home usually runs in the 130s and diastolics in the 80s. Denies any blurred vision, weakness, numbness or dizziness.  She is requesting to get her INR checked because she missed her appointment with Dr. Alexandria Lodge.  Past Medical History  Diagnosis Date  . Sleep apnea   . Deep vein thrombosis     left lower extremity  . Pulmonary embolism   . Allergy   . Anemia   . Hypertension   . History of rectal cancer     T1N0 resected 08/2009  . Diverticulitis large intestine   . Obesity   . Infectious colitis   . Cancer    Family History  Problem Relation Age of Onset  . Cancer Mother     unknown type but patient thinks it was bone marrow cancer  . Heart disease Sister   . Heart disease Brother   . Clotting disorder Brother    History   Social History  . Marital Status: Single    Spouse Name: N/A    Number of Children: N/A  . 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 Smoking status: Current Every Day Smoker    Last Attempt to Quit: 12/29/2008  . Smokeless tobacco: Not on file  . Alcohol Use: No  . Drug Use: No  . Sexually Active:    Other Topics Concern  . Not on file   Social History Narrative   Patient does not regular  exercise. He has three boys. Daily caffeine use 2/day.   Review of Systems: General: Denies fever, chills, diaphoresis, appetite change and fatigue. HEENT: Denies photophobia, eye pain, redness, hearing loss, ear pain, congestion, sore throat, rhinorrhea, sneezing, mouth sores, trouble swallowing, neck pain, neck stiffness and tinnitus. 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 Cardiovascular: Denies to chest pain, palpitations and leg swelling. Gastrointestinal: Denies nausea, vomiting, abdominal pain, diarrhea, constipation, blood in stool and abdominal distention. Genitourinary: Denies dysuria, urgency, frequency, hematuria, flank pain and difficulty urinating. Musculoskeletal: Denies myalgias, back pain, joint swelling, arthralgias and gait problem.  Skin: Denies pallor, rash and wound. Neurological: Denies dizziness, seizures, syncope, weakness, light-headedness, numbness and headaches. Hematological: Denies adenopathy, easy bruising, personal or family bleeding history. Psychiatric/Behavioral: Denies suicidal ideation, mood changes, confusion, nervousness, sleep disturbance and agitation.    Current Outpatient Medications: Current Outpatient Prescriptions  Medication Sig Dispense Refill  . acetaminophen (TYLENOL) 500 MG tablet Take 1,000 mg by mouth every 6 (six) hours as needed. pain      . hydrochlorothiazide (HYDRODIURIL) 25 MG tablet Take 0.5 tablets (12.5 mg total) by mouth daily. Take half pill by mouth daily for blood pressure  30  tablet  11  . warfarin (COUMADIN) 5 MG tablet Take 5-7.5 mg by mouth daily. Take 7.5 mg Monday, Tuesday, Wednesday, Thursday, Friday, then 5 mg on Saturday and Sunday        Allergies: Allergies  Allergen Reactions  . Aspirin     REACTION: can not take due to coumadin      Objective:   Physical Exam: There were no vitals filed for this visit.  General: Vital  signs reviewed and noted. Well-developed, well-nourished, in no acute distress; alert, appropriate and cooperative throughout examination. Head: Normocephalic, atraumatic Lungs: Normal respiratory effort. Clear to auscultation BL without crackles or wheezes. Heart: RRR. S1 and S2 normal without gallop, murmur, or rubs. Abdomen:BS normoactive. Soft, Nondistended, non-tender.  No masses or organomegaly. Extremities: No pretibial edema.     Assessment & Plan:

## 2012-11-06 ENCOUNTER — Ambulatory Visit (INDEPENDENT_AMBULATORY_CARE_PROVIDER_SITE_OTHER): Payer: BC Managed Care – PPO | Admitting: Pharmacist

## 2012-11-06 DIAGNOSIS — Z7901 Long term (current) use of anticoagulants: Secondary | ICD-10-CM

## 2012-11-06 MED ORDER — WARFARIN SODIUM 5 MG PO TABS: ORAL_TABLET | ORAL | Status: DC

## 2012-11-06 NOTE — Patient Instructions (Signed)
Patient instructed to take medications as defined in the Anti-coagulation Track section of this encounter.  Patient instructed to take today's dose.  Patient verbalized understanding of these instructions.    

## 2012-11-06 NOTE — Addendum Note (Signed)
Addended by: [name] B on: 11/06/2012 04:37 PM   Modules accepted: Orders

## 2012-11-06 NOTE — Progress Notes (Signed)
Anti-Coagulation Progress Note  Alicia Martinez is a 57 y.o. female who is currently on an anti-coagulation regimen.    RECENT RESULTS: Recent results are below, the most recent result is correlated with a dose of 45 mg. per week and her INR last week was incidentally found to be >4.0 while being seen by PCP who HELD/OMITTED 1 dose and reduced to 5mg  warfarin PO qd thereafter. Given the fall in INR, will recommence upon the regimen below (40mg /wk) and re-evaluate INR in 2 weeks.  Lab Results  Component Value Date   INR 2.30 11/06/2012   INR 4.4 10/31/2012   INR 3.50 08/07/2012    ANTI-COAG DOSE: Anticoagulation Dose Instructions as of 11/06/2012     [name] Tue Wed Thu Fri Sat   New Dose 5 mg 7.5 mg 5 mg 5 mg 7.5 mg 5 mg 5 mg    Description       Prior to last weeks incidental INR determination (4>) she had been on 7.5mg  4 days of the week; 5mg  3 days of the week. When seen last week, she was instructed to HOLD/OMIT x 1 day/dose; and resume with 5mg  warfarin PO QD until seen by me .       ANTICOAG SUMMARY: Anticoagulation Episode Summary   Current INR goal 2.0-3.0  Next INR check 11/20/2012  INR from last check 2.30 (11/06/2012)  Weekly max dose   Target end date Indefinite  [facility]  Preferred lab   Send INR reminders to    Indications  DEEP VENOUS THROMBOPHLEBITIS RECURRENT (Resolved) [453.40] Long term current use of anticoagulant [V58.61]        Comments         ANTICOAG TODAY: Anticoagulation Summary as of 11/06/2012   INR goal 2.0-3.0  Selected INR 2.30 (11/06/2012)  Next INR check 11/20/2012  Target end date Indefinite   Indications  DEEP VENOUS THROMBOPHLEBITIS RECURRENT (Resolved) [453.40] Long term current use of anticoagulant [V58.61]      Anticoagulation Episode Summary   [facility] check location [facility]   Preferred lab    Send INR reminders to    Comments       PATIENT INSTRUCTIONS: Patient Instructions  Patient  instructed to take medications as defined in the Anti-coagulation Track section of this encounter.  Patient instructed to take today's dose.  Patient verbalized understanding of these instructions.       FOLLOW-UP Return in 2 weeks (on 11/20/2012) for Follow up INR at 4:15PM.  [name], [name] Pharm.D., CACP

## 2012-11-06 NOTE — Progress Notes (Signed)
Agree 

## 2012-11-20 ENCOUNTER — Ambulatory Visit (INDEPENDENT_AMBULATORY_CARE_PROVIDER_SITE_OTHER): Payer: BC Managed Care – PPO | Admitting: Pharmacist

## 2012-11-20 DIAGNOSIS — Z7901 Long term (current) use of anticoagulants: Secondary | ICD-10-CM

## 2012-11-20 NOTE — Progress Notes (Signed)
Anti-Coagulation Progress Note  Alicia Martinez is a 57 y.o. female who is currently on an anti-coagulation regimen.    RECENT RESULTS: Recent results are below, the most recent result is correlated with a dose of 40 mg. per week: Lab Results  Component Value Date   INR 2.00 11/20/2012   INR 2.30 11/06/2012   INR 4.4 10/31/2012    ANTI-COAG DOSE: Anticoagulation Dose Instructions as of 11/20/2012     [name] Tue Wed Thu Fri Sat   New Dose 7.5 mg 5 mg 7.5 mg 5 mg 7.5 mg 5 mg 7.5 mg       ANTICOAG SUMMARY: Anticoagulation Episode Summary   Current INR goal 2.0-3.0  Next INR check 12/11/2012  INR from last check 2.00 (11/20/2012)  Weekly max dose   Target end date Indefinite  [facility]  Preferred lab   Send INR reminders to    Indications  DEEP VENOUS THROMBOPHLEBITIS RECURRENT (Resolved) [453.40] Long term current use of anticoagulant [V58.61]        Comments         ANTICOAG TODAY: Anticoagulation Summary as of 11/20/2012   INR goal 2.0-3.0  Selected INR 2.00 (11/20/2012)  Next INR check 12/11/2012  Target end date Indefinite   Indications  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  Patient instructed to take today's dose.  Patient verbalized understanding of these instructions.       FOLLOW-UP Return in 3 weeks (on 12/11/2012) for Follow up INR at 4PM.  [name], [name] Pharm.D., CACP

## 2012-11-20 NOTE — Patient Instructions (Signed)
Patient instructed to take medications as defined in the Anti-coagulation Track section of this encounter.  Patient instructed to take today's dose.  Patient verbalized understanding of these instructions.    

## 2012-12-11 ENCOUNTER — Ambulatory Visit: Payer: BC Managed Care – PPO

## 2012-12-18 ENCOUNTER — Ambulatory Visit: Payer: BC Managed Care – PPO

## 2013-03-07 ENCOUNTER — Other Ambulatory Visit: Payer: Self-pay | Admitting: *Deleted

## 2013-03-07 DIAGNOSIS — Z7901 Long term (current) use of anticoagulants: Secondary | ICD-10-CM

## 2013-03-08 MED ORDER — WARFARIN SODIUM 5 MG PO TABS: ORAL_TABLET | ORAL | Status: DC

## 2013-03-19 ENCOUNTER — Encounter: Payer: Self-pay | Admitting: Internal Medicine

## 2013-05-07 ENCOUNTER — Encounter: Payer: BC Managed Care – PPO | Admitting: Internal Medicine

## 2013-05-08 ENCOUNTER — Ambulatory Visit (HOSPITAL_COMMUNITY)
Admission: RE | Admit: 2013-05-08 | Discharge: 2013-05-08 | Disposition: A | Discharge status: Home / Self Care | Payer: BC Managed Care – PPO | Source: Ambulatory Visit | Attending: Internal Medicine | Admitting: Internal Medicine

## 2013-05-08 ENCOUNTER — Ambulatory Visit (INDEPENDENT_AMBULATORY_CARE_PROVIDER_SITE_OTHER): Payer: BC Managed Care – PPO | Admitting: Internal Medicine

## 2013-05-08 ENCOUNTER — Encounter: Payer: Self-pay | Admitting: Internal Medicine

## 2013-05-08 ENCOUNTER — Telehealth: Payer: Self-pay | Admitting: Internal Medicine

## 2013-05-08 VITALS — BP 142/77 | HR 68 | Temp 98.3°F | Ht 66.0 in | Wt 272.0 lb

## 2013-05-08 DIAGNOSIS — R002 Palpitations: Secondary | ICD-10-CM | POA: Insufficient documentation

## 2013-05-08 DIAGNOSIS — Z0000 Encounter for general adult medical examination without abnormal findings: Secondary | ICD-10-CM

## 2013-05-08 DIAGNOSIS — G4730 Sleep apnea, unspecified: Secondary | ICD-10-CM

## 2013-05-08 DIAGNOSIS — M549 Dorsalgia, unspecified: Secondary | ICD-10-CM

## 2013-05-08 DIAGNOSIS — F17200 Nicotine dependence, unspecified, uncomplicated: Secondary | ICD-10-CM

## 2013-05-08 DIAGNOSIS — R300 Dysuria: Secondary | ICD-10-CM

## 2013-05-08 DIAGNOSIS — Z87898 Personal history of other specified conditions: Secondary | ICD-10-CM | POA: Insufficient documentation

## 2013-05-08 DIAGNOSIS — Z7901 Long term (current) use of anticoagulants: Secondary | ICD-10-CM

## 2013-05-08 DIAGNOSIS — I10 Essential (primary) hypertension: Secondary | ICD-10-CM

## 2013-05-08 DIAGNOSIS — I498 Other specified cardiac arrhythmias: Secondary | ICD-10-CM | POA: Insufficient documentation

## 2013-05-08 LAB — CBC WITH DIFFERENTIAL/PLATELET
Eosinophils Absolute: 0.1 10*3/uL (ref 0.0–0.7)
Hemoglobin: 12.8 g/dL (ref 12.0–15.0)
Lymphocytes Relative: 38 % (ref 12–46)
Lymphs Abs: 1.8 10*3/uL (ref 0.7–4.0)
MCH: 30.3 pg (ref 26.0–34.0)
Monocytes Relative: 6 % (ref 3–12)
Neutro Abs: 2.6 10*3/uL (ref 1.7–7.7)
Neutrophils Relative %: 53 % (ref 43–77)
Platelets: 279 10*3/uL (ref 150–400)
RBC: 4.23 MIL/uL (ref 3.87–5.11)
WBC: 4.8 10*3/uL (ref 4.0–10.5)

## 2013-05-08 LAB — COMPREHENSIVE METABOLIC PANEL
Albumin: 3.5 g/dL (ref 3.5–5.2)
Alkaline Phosphatase: 92 U/L (ref 39–117)
BUN: 10 mg/dL (ref 6–23)
CO2: 31 mEq/L (ref 19–32)
Glucose, Bld: 108 mg/dL — ABNORMAL HIGH (ref 70–99)
Potassium: 3.5 mEq/L (ref 3.5–5.3)

## 2013-05-08 LAB — TSH: TSH: 1.459 u[IU]/mL (ref 0.350–4.500)

## 2013-05-08 LAB — PROTIME-INR
INR: 4.56 — ABNORMAL HIGH (ref <1.50)
Prothrombin Time: 41.4 seconds — ABNORMAL HIGH (ref 11.6–15.2)

## 2013-05-08 NOTE — Assessment & Plan Note (Addendum)
BP Readings from Last 3 Encounters:  05/08/13 142/77  10/31/12 154/87  08/07/12 133/82    Lab Results  Component Value Date   NA 141 05/08/2013   K 3.5 05/08/2013   CREATININE 0.63 05/08/2013    Assessment: Blood pressure control: mildly elevated Progress toward BP goal:  improved  Plan: Medications:  continue current medications Will check CMET

## 2013-05-08 NOTE — Assessment & Plan Note (Signed)
Pt on long term anticoagulation due to recurrent DVT.  She has not had her INR checked since February.    -check PT/INR -f/u with Dr. Alexandria Lodge

## 2013-05-08 NOTE — Progress Notes (Signed)
Patient ID: Alicia Martinez, female   DOB: 1955/09/29, 57 y.o.   MRN: [id number]           HPI: [name] is a 57 y.o. with an extensive PMH as listed below.  She comes in for a routine visit.  She admits to continuing chronic back pain which is basically unchanged from previous visits.  She reports having palpitations everyday for the past several months that can occur at any time of day and typically last for about an hour.  She describes the palpitations as her heart racing.  She denies any syncopal episodes, CP, SOB, N/V, or any other associated symptoms.  Additionally, she has been experiencing some dizziness spells for the past six months. She states that she has no passed out but felt like she may.  She describes the dizziness as occuring when she is sitting or standing and denies the room is spinning.  She reports that everything goes black.  There are no associated symptoms.  Upon ROS she also describes some dysuria several days ago but not currently.  She denies any frequency, hesitancy, or hematuria.    She is on long term anticoagulation due to recurrent DVT.  She last saw Dr. Alexandria Lodge in February and has not had her INR checked since that time.  She reports having about a teaspoon of epistaxis everyday recently.    She also is on a CPAP machine for OSA.  She still reports daytime sleepiness and states it does not help with her breathing at all and has difficulty sleeping.    Past Medical History  Diagnosis Date  . Deep vein thrombosis     left lower extremity  . Pulmonary embolism   . Allergy   . Anemia   . Hypertension   . History of rectal cancer     T1N0 resected 08/2009  . Diverticulitis large intestine   . Obesity   . Infectious colitis   . Cancer   . Sleep apnea 08/09/2012    Sleep study 02/2013 : Severe obstructive sleep apnea/hypopnea syndrome, AHI 88.7, desat to 84%. CPAP titration to 15 CWP, AHI 0 per hour. Medium ResMed Mirage Quattro full-face mask  with heated humidifier.       Current Outpatient Prescriptions  Medication Sig Dispense Refill  . hydrochlorothiazide (HYDRODIURIL) 25 MG tablet Take 0.5 tablets (12.5 mg total) by mouth daily. For blood pressure  30 tablet  11  . warfarin (COUMADIN) 5 MG tablet Take 7.5mg  warfarin on Monday's and Thursday's. All other days, take 5mg  (1 tablet).  45 tablet  1  . acetaminophen (TYLENOL) 500 MG tablet Take 1,000 mg by mouth every 6 (six) hours as needed. pain      . buPROPion (ZYBAN) 150 MG 12 hr tablet Take 1 tablet (150 mg total) by mouth 2 (two) times daily.  60 tablet  1  . diphenhydrAMINE (BENADRYL) 50 MG tablet Take 0.5 tablets (25 mg total) by mouth every 8 (eight) hours as needed for itching.  30 tablet  0   No current facility-administered medications for this visit.   Family History  Problem Relation Age of Onset  . Cancer Mother     unknown type but patient thinks it was bone marrow cancer  . Heart disease Sister   . Heart disease Brother   . Clotting disorder Brother    History   Social History  . Marital Status: Single    Spouse Name: N/A    Number of Children:  N/A  . 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 Smoking status: Current Every Day Smoker -- 0.40 packs/day    Last Attempt to Quit: 12/29/2008  . Smokeless tobacco: None  . Alcohol Use: No  . Drug Use: No  . Sexually Active: None   Other Topics Concern  . None   Social History Narrative   Patient does not regular exercise. He has three boys. Daily caffeine use 2/day.    Review of Systems: Constitutional: Denies fever, chills, diaphoresis, appetite change and fatigue.  Respiratory: Denies SOB, DOE, cough, chest tightness, and wheezing.  Cardiovascular: No chest pain. Gastrointestinal: No abdominal pain, nausea, vomiting, bloody stools Genitourinary: No frequency, hematuria, or flank pain.    Objective:  Physical Exam: Filed Vitals:    05/08/13 0903 05/08/13 0905 05/08/13 0907 05/08/13 0908  BP: 132/73 146/85 142/77 142/77  Pulse: 65 75  68  Temp:      TempSrc:      Height:      Weight:      SpO2:       General: Well nourished. No acute distress.  Lungs: CTA bilaterally. Heart: RRR; no extra sounds or murmurs  Abdomen: Non-distended, normal BS, soft, nontender; no hepatosplenomegaly  Extremities: 1+ pedal edema b/l. No joint swelling or tenderness. Bruising of the lower extremities. Neurologic: Alert and oriented x3. No obvious neurologic deficits.  Assessment & Plan:  I have discussed my assessment and plan with Dr. Meredith Pel  as detailed under problem based charting.

## 2013-05-08 NOTE — Assessment & Plan Note (Signed)
  Assessment: Progress toward smoking cessation:  smoking less Barriers to progress toward smoking cessation:  lack of motivation to quit (relaxing)  Plan: Instruction/counseling given:  I counseled patient on the dangers of tobacco use, advised patient to stop smoking, and reviewed strategies to maximize success. Educational resources provided:  [facility] (1-800-QUIT-NOW) brochure Medications to assist with smoking cessation:  Bupropion (Zyban) Other plans: Pt was previously on Zyban but states it has not helped her quit smoking.

## 2013-05-08 NOTE — Assessment & Plan Note (Signed)
Unchanged from previous visits.  -reassess at next visit

## 2013-05-08 NOTE — Patient Instructions (Signed)
Please return to the clinic in 1 month  1. We will check your CPAP machine to insure it is working properly 2. We will check labs today  3. We will get an EKG and refer you to cardiology for your heart palpitations and dizziness 4. I will send a prescription for you to the pharmacy for flonase for seasonal allergies; please use this as instructed 5. Please obtain zyrtec from your pharmacy for seasonal allergies

## 2013-05-08 NOTE — Assessment & Plan Note (Signed)
UTD on health maintenance  -has colonoscopy every 3 years due to h/o rectal cancer

## 2013-05-08 NOTE — Assessment & Plan Note (Addendum)
Pt having palpitations for several months now of unclear etiology; denies any other associated symptoms.  -referral to cardiology -obtain EKG and echocardiogram -check TSH, CBC with diff

## 2013-05-08 NOTE — Assessment & Plan Note (Signed)
Pt has no relief from her breathing on CPAP or improvement in daytime sleepiness.    -will have CPAP machine interrogated -will have her f/u with Dr. Maple Hudson (pulmonology) if CPAP is working properly

## 2013-05-09 LAB — URINALYSIS, MICROSCOPIC ONLY

## 2013-05-09 LAB — URINALYSIS, ROUTINE W REFLEX MICROSCOPIC
Nitrite: NEGATIVE
Protein, ur: NEGATIVE mg/dL
Specific Gravity, Urine: 1.029 (ref 1.005–1.030)
Urobilinogen, UA: 0.2 mg/dL (ref 0.0–1.0)

## 2013-05-09 NOTE — Addendum Note (Signed)
Addended by: Marrian Salvage on: 05/09/2013 11:58 AM   Modules accepted: Orders

## 2013-05-09 NOTE — Progress Notes (Signed)
I saw and evaluated the patient.  I personally confirmed the key portions of the history and exam documented by Dr. Delane Ginger and I reviewed pertinent patient test results.  The assessment, diagnosis, and plan were formulated together and I agree with the documentation in the resident's note.  Given her palpitations and occasional episodes of presyncope, agree with referral to cardiology for evaluation.

## 2013-05-10 ENCOUNTER — Ambulatory Visit (HOSPITAL_COMMUNITY): Payer: BC Managed Care – PPO | Attending: Cardiovascular Disease | Admitting: Radiology

## 2013-05-10 ENCOUNTER — Other Ambulatory Visit: Payer: Self-pay | Admitting: Internal Medicine

## 2013-05-10 DIAGNOSIS — F17200 Nicotine dependence, unspecified, uncomplicated: Secondary | ICD-10-CM | POA: Insufficient documentation

## 2013-05-10 DIAGNOSIS — I10 Essential (primary) hypertension: Secondary | ICD-10-CM | POA: Insufficient documentation

## 2013-05-10 DIAGNOSIS — G4730 Sleep apnea, unspecified: Secondary | ICD-10-CM | POA: Insufficient documentation

## 2013-05-10 DIAGNOSIS — R002 Palpitations: Secondary | ICD-10-CM | POA: Insufficient documentation

## 2013-05-10 DIAGNOSIS — E669 Obesity, unspecified: Secondary | ICD-10-CM | POA: Insufficient documentation

## 2013-05-10 NOTE — Progress Notes (Signed)
Echocardiogram performed.  

## 2013-05-14 ENCOUNTER — Ambulatory Visit (INDEPENDENT_AMBULATORY_CARE_PROVIDER_SITE_OTHER): Payer: BC Managed Care – PPO | Admitting: Pharmacist

## 2013-05-14 DIAGNOSIS — Z7901 Long term (current) use of anticoagulants: Secondary | ICD-10-CM

## 2013-05-14 DIAGNOSIS — I82409 Acute embolism and thrombosis of unspecified deep veins of unspecified lower extremity: Secondary | ICD-10-CM

## 2013-05-14 LAB — POCT INR: INR: 2.2

## 2013-05-14 MED ORDER — WARFARIN SODIUM 5 MG PO TABS: 5.0000 mg | ORAL_TABLET | Freq: Every day | ORAL | Status: DC

## 2013-05-14 NOTE — Progress Notes (Signed)
Anti-Coagulation Progress Note  Alicia Martinez is a 57 y.o. female who is currently on an anti-coagulation regimen.    RECENT RESULTS: Recent results are below, the most recent result is correlated with a dose of 45 mg. per week: Lab Results  Component Value Date   INR 2.20 05/14/2013   INR 4.56* 05/08/2013   INR 2.00 11/20/2012    ANTI-COAG DOSE: Anticoagulation Dose Instructions as of 05/14/2013     [name] Tue Wed Thu Fri Sat   New Dose 5 mg 5 mg 5 mg 5 mg 5 mg 5 mg 5 mg       ANTICOAG SUMMARY: Anticoagulation Episode Summary   Current INR goal 2.0-3.0  Next INR check 05/21/2013  INR from last check 2.20 (05/14/2013)  Weekly max dose   Target end date Indefinite  [facility]  Preferred lab   Send INR reminders to    Indications  DEEP VENOUS THROMBOPHLEBITIS RECURRENT (Resolved) [453.40] Long term current use of anticoagulant [V58.61]        Comments         ANTICOAG TODAY: Anticoagulation Summary as of 05/14/2013   INR goal 2.0-3.0  Selected INR 2.20 (05/14/2013)  Next INR check 05/21/2013  Target end date Indefinite   Indications  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  Patient instructed to HOLD/OMIT today's dose.  Patient verbalized understanding of these instructions.       FOLLOW-UP Return in 7 days (on 05/21/2013) for Follow up INR at 1000h.  [name], [name] Pharm.D., CACP

## 2013-05-14 NOTE — Patient Instructions (Signed)
Patient instructed to take medications as defined in the Anti-coagulation Track section of this encounter.  Patient instructed to HOLD/OMIT today's dose.  Patient verbalized understanding of these instructions.    

## 2013-05-14 NOTE — Addendum Note (Signed)
Addended by: [name] B on: 05/14/2013 05:47 PM   Modules accepted: Orders

## 2013-05-18 ENCOUNTER — Telehealth: Payer: Self-pay | Admitting: *Deleted

## 2013-05-18 NOTE — Telephone Encounter (Signed)
Call to pt to notify her of her appointmnet with Cardiology.  Pt has an appointment with Dr. Gwynne Edinger Cardiology on 06/21/2013 at 10:30 to arrive by 10:15 AM.  Message was left with female who answered phone to have patient call her doctor's office at the Clinics and to ask for [facility].  Angelina Ok, RN 05/18/2013 4:45 AM.

## 2013-05-22 ENCOUNTER — Telehealth: Payer: Self-pay | Admitting: *Deleted

## 2013-05-22 NOTE — Telephone Encounter (Signed)
RTC   at [phone] to pt.  Female answerwed said pt did not work there.  Call to pt's Mobile number no answer.  Angelina Ok, RN 05/22/2013  9:33 AM.

## 2013-05-24 ENCOUNTER — Telehealth: Payer: Self-pay | Admitting: *Deleted

## 2013-05-24 NOTE — Telephone Encounter (Signed)
Please call pt at [phone]

## 2013-06-08 ENCOUNTER — Telehealth: Payer: Self-pay | Admitting: *Deleted

## 2013-06-21 ENCOUNTER — Ambulatory Visit (INDEPENDENT_AMBULATORY_CARE_PROVIDER_SITE_OTHER): Payer: BC Managed Care – PPO | Admitting: Internal Medicine

## 2013-06-21 ENCOUNTER — Encounter: Payer: Self-pay | Admitting: Internal Medicine

## 2013-06-21 VITALS — BP 140/80 | HR 64 | Ht 66.0 in | Wt 274.0 lb

## 2013-06-21 DIAGNOSIS — R609 Edema, unspecified: Secondary | ICD-10-CM

## 2013-06-21 DIAGNOSIS — R002 Palpitations: Secondary | ICD-10-CM

## 2013-06-21 DIAGNOSIS — R0602 Shortness of breath: Secondary | ICD-10-CM

## 2013-06-21 LAB — URINALYSIS
Nitrite: NEGATIVE
Specific Gravity, Urine: 1.02 (ref 1.000–1.030)
Total Protein, Urine: NEGATIVE
Urine Glucose: NEGATIVE
Urobilinogen, UA: 2 (ref 0.0–1.0)

## 2013-06-21 LAB — BASIC METABOLIC PANEL
CO2: 28 mEq/L (ref 19–32)
Calcium: 8.8 mg/dL (ref 8.4–10.5)
GFR: 126.19 mL/min (ref >60.00)
Glucose, Bld: 89 mg/dL (ref 70–99)
Potassium: 3.7 mEq/L (ref 3.5–5.1)
Sodium: 140 mEq/L (ref 135–145)

## 2013-06-21 NOTE — Patient Instructions (Addendum)
Will obtain labs today and call you with the results (bmet/bnp/urinalysis)  Will arrange for Pulmonary consult with Dr Shelle Iron or Dr Vassie Loll, will call appointment  Your physician has recommended that you wear an event monitor. Event monitors are medical devices that record the heart's electrical activity. Doctors most often [place] these monitors to diagnose arrhythmias. Arrhythmias are problems with the speed or rhythm of the heartbeat. The monitor is a small, portable device. You can wear one while you do your normal daily activities. This is usually used to diagnose what is causing palpitations/syncope (passing out).  Your physician recommends that you continue on your current medications as directed. Please refer to the Current Medication list given to you today.  Your physician recommends that you schedule a follow-up appointment in: 2 months, will call you with appointment

## 2013-06-21 NOTE — Progress Notes (Signed)
HPI Patient is a 57 yo who is referred for evaluation of palpitations  I saw the patient in past though cannot find record The patient notes episodes of feeling her heart race.  Denies syncope.  Some increased SOB  No CP The patient does hav some dizziness, not associated with palpitations The patient has a history of sleep apnea  Says that CPAP is not helping.   Also history of recurrent DVT   On long term anticoag.      Allergies  Allergen Reactions  . Aspirin     REACTION: can not take due to coumadin    Current Outpatient Prescriptions  Medication Sig Dispense Refill  . acetaminophen (TYLENOL) 500 MG tablet Take 1,000 mg by mouth every 6 (six) hours as needed. pain      . diphenhydrAMINE (BENADRYL) 50 MG tablet Take 0.5 tablets (25 mg total) by mouth every 8 (eight) hours as needed for itching.  30 tablet  0  . hydrochlorothiazide (HYDRODIURIL) 25 MG tablet Take 0.5 tablets (12.5 mg total) by mouth daily. For blood pressure  30 tablet  11  . warfarin (COUMADIN) 5 MG tablet Take 1 tablet (5 mg total) by mouth daily.  30 tablet  2   No current facility-administered medications for this visit.    Past Medical History  Diagnosis Date  . Deep vein thrombosis     left lower extremity  . Pulmonary embolism   . Allergy   . Anemia   . Hypertension   . History of rectal cancer     T1N0 resected 08/2009  . Diverticulitis large intestine   . Obesity   . Infectious colitis   . Cancer   . Sleep apnea 08/09/2012    Sleep study 02/2013 : Severe obstructive sleep apnea/hypopnea syndrome, AHI 88.7, desat to 84%. CPAP titration to 15 CWP, AHI 0 per hour. Medium ResMed Mirage Quattro full-face mask with heated humidifier.        Past Surgical History  Procedure Laterality Date  . Abdominal hysterectomy    . Tubal ligation    . Transanal rectal resection  08/2009    T1N0 rectal cancer Dr. Michaell Cowing    Family History  Problem Relation Age of Onset  . Cancer Mother     unknown type but  patient thinks it was bone marrow cancer  . Heart disease Sister   . Heart disease Brother   . Clotting disorder Brother     History   Social History  . Marital Status: Single    Spouse Name: N/A    Number of Children: N/A  . 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 Smoking status: Current Every Day Smoker -- 0.40 packs/day    Last Attempt to Quit: 12/29/2008  . Smokeless tobacco: Not on file  . Alcohol Use: No  . Drug Use: No  . Sexual Activity: Not on file   Other Topics Concern  . Not on file   Social History Narrative   Patient does not regular exercise. He has three boys. Daily caffeine use 2/day.    Review of Systems:  All systems reviewed.  They are negative to the above problem except as previously stated.  Vital Signs: BP 140/80  Pulse 64  Ht 5\' 6"  (1.676 m)  Wt 274 lb (124.286 kg)  BMI 44.25 kg/m2  Physical Exam Patient is in NAD  Morbidy obese HEENT:  Normocephalic, atraumatic. EOMI,  PERRLA.  Neck: JVP is normal.  No bruits.  Lungs: clear to auscultation. No rales no wheezes.  Heart: Regular rate and rhythm. Normal S1, S2. No S3.   No significant murmurs. PMI not displaced.  Abdomen:  Supple, nontender. Normal bowel sounds. No masses. No hepatomegaly.  Extremities:   Good distal pulses throughout. No lower extremity edema.  Musculoskeletal :moving all extremities.  Neuro:   alert and oriented x3.  CN II-XII grossly intact.  EKG  8/12  SB 56.   Assessment and Plan:  1.  Palpitations  Will set up for event monitro  Do not sound hemodynamically destabilizing  Will set up for labs.    2.  Sleep apnea  Will set up for pulm to reeval  3.  Dizziness I am not convinced related to palpitations.  Follow  Increase salt/fluid intake.  4.  Recurrent DVT  Continue anticoag  5.  HTN  Adeq control  6.  Morbid obesity.  Counselled on wt loss.

## 2013-06-25 ENCOUNTER — Encounter: Payer: Self-pay | Admitting: Internal Medicine

## 2013-06-25 ENCOUNTER — Encounter: Payer: BC Managed Care – PPO | Admitting: Internal Medicine

## 2013-06-28 ENCOUNTER — Other Ambulatory Visit: Payer: Self-pay | Admitting: Internal Medicine

## 2013-06-28 DIAGNOSIS — Z1231 Encounter for screening mammogram for malignant neoplasm of breast: Secondary | ICD-10-CM

## 2013-06-29 ENCOUNTER — Encounter (INDEPENDENT_AMBULATORY_CARE_PROVIDER_SITE_OTHER): Payer: BC Managed Care – PPO

## 2013-06-29 ENCOUNTER — Encounter: Payer: Self-pay | Admitting: Radiology

## 2013-06-29 DIAGNOSIS — R002 Palpitations: Secondary | ICD-10-CM

## 2013-06-29 DIAGNOSIS — R0602 Shortness of breath: Secondary | ICD-10-CM

## 2013-06-29 NOTE — Progress Notes (Signed)
Patient ID: Alicia Martinez, female   DOB: 11-01-1955, 57 y.o.   MRN: [id number] E cardio verite 30 day monitor applied

## 2013-07-05 ENCOUNTER — Ambulatory Visit (HOSPITAL_COMMUNITY)
Admission: RE | Admit: 2013-07-05 | Discharge: 2013-07-05 | Disposition: A | Discharge status: Home / Self Care | Payer: BC Managed Care – PPO | Source: Ambulatory Visit | Attending: Internal Medicine | Admitting: Internal Medicine

## 2013-07-05 DIAGNOSIS — Z1231 Encounter for screening mammogram for malignant neoplasm of breast: Secondary | ICD-10-CM | POA: Insufficient documentation

## 2013-07-27 ENCOUNTER — Ambulatory Visit (INDEPENDENT_AMBULATORY_CARE_PROVIDER_SITE_OTHER): Payer: BC Managed Care – PPO | Admitting: Pulmonary Disease

## 2013-07-27 ENCOUNTER — Encounter: Payer: Self-pay | Admitting: Pulmonary Disease

## 2013-07-27 VITALS — BP 122/68 | HR 76 | Temp 99.1°F | Ht 66.0 in | Wt 270.6 lb

## 2013-07-27 DIAGNOSIS — G4730 Sleep apnea, unspecified: Secondary | ICD-10-CM

## 2013-07-27 NOTE — Patient Instructions (Signed)
You have severe obstructive sleep apnea  We will check download on your machine & make changes to help you with pressure & dryness Weight loss encouraged, compliance with goal of at least 4-6 hrs every night is the expectation.

## 2013-07-27 NOTE — Assessment & Plan Note (Signed)
You have severe obstructive sleep apnea  We will check download on your machine & make changes to help you with pressure & dryness  Weight loss encouraged, compliance with goal of at least 4-6 hrs every night is the expectation. Advised against medications with sedative side effects Cautioned against driving when sleepy - understanding that sleepiness will vary on a day to day basis

## 2013-07-27 NOTE — Progress Notes (Signed)
Subjective:    Patient ID: Alicia Martinez, female    DOB: Jan 29, 1956, 57 y.o.   MRN: [id number]  HPI  57 year oldSmoker referred for evaluation of obstructive sleep apnea. 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 She complains of palpitations and is wearing an event monitor for 30 days. She is maintained on Coumadin for recurrent DVT and PE. Epworth sleepiness score is 15 or 24. Bedtime is around 9 PM, sleep latency is minimal she sleeps on her stomach with 2 pillows, reports nocturnal awakenings every 2 hours, some spontaneous instability gasping episodes and is out of bed at 5 AM feeling tired with occasional dryness of mouth.  Overnight polysomnogram in December 2013, and she weighed250 pounds, showed severe obstructive sleep apnea with AHI of 88 events per hour, lowest desaturation of 84 percent. This was corrected by CPAP of 15 cm.  She started using CPAP since March 2014, initially started on nasal mask but converted to a full face. ([facility] She has gained 20 pounds past last year.  Her main complaints are dryness of mouth, and she reports that the pressure was too high. She also reports a sensation of bloating on waking up in the morning. There is no history suggestive of cataplexy, sleep paralysis or parasomnias    Past Medical History  Diagnosis Date  . Deep vein thrombosis     left lower extremity  . Pulmonary embolism   . Allergy   . Anemia   . Hypertension   . History of rectal cancer     T1N0 resected 08/2009  . Diverticulitis large intestine   . Obesity   . Infectious colitis   . Cancer   . Sleep apnea 08/09/2012    Sleep study 02/2013 : Severe obstructive sleep apnea/hypopnea syndrome, AHI 88.7, desat to 84%. CPAP titration to 15 CWP, AHI 0 per hour. Medium ResMed Mirage Quattro full-face mask with heated humidifier.       Past Surgical History  Procedure Laterality Date  . Abdominal hysterectomy    . Tubal ligation    .  Transanal rectal resection  08/2009    T1N0 rectal cancer Dr. Michaell Cowing    Allergies  Allergen Reactions  . Aspirin     REACTION: can not take due to coumadin    History   Social History  . Marital Status: Single    Spouse Name: N/A    Number of Children: N/A  . 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 Smoking status: Current Every Day Smoker -- 0.10 packs/day for 40 years    Types: Cigarettes  . Smokeless tobacco: Not on file  . Alcohol Use: No  . Drug Use: No  . Sexual Activity: Not on file   Other Topics Concern  . Not on file   Social History Narrative   Patient does not regular exercise. He has three boys. Daily caffeine use 2/day.    Family History  Problem Relation Age of Onset  . Cancer Mother     unknown type but patient thinks it was bone marrow cancer  . Heart disease Sister   . Heart disease Brother   . Clotting disorder Brother       Review of Systems  Constitutional: Positive for unexpected weight change. Negative for fever.  HENT: Positive for postnasal drip. Negative for congestion, dental problem, ear pain, nosebleeds, rhinorrhea, sinus pressure, sneezing, sore  throat and trouble swallowing.   Eyes: Negative for redness and itching.  Respiratory: Negative for cough, chest tightness, shortness of breath and wheezing.   Cardiovascular: Negative for palpitations and leg swelling.  Gastrointestinal: Negative for nausea and vomiting.  Genitourinary: Negative for dysuria.  Musculoskeletal: Negative for joint swelling.  Skin: Negative for rash.  Neurological: Negative for headaches.  Psychiatric/Behavioral: Negative for dysphoric mood. The patient is not nervous/anxious.        Objective:   Physical Exam  Gen. Pleasant, obese, in no distress, normal affect ENT - no lesions, no post nasal drip, class 2-3 airway Neck: No JVD, no thyromegaly, no carotid bruits Lungs: no use of accessory  muscles, no dullness to percussion, decreased without rales or rhonchi  Cardiovascular: Rhythm regular, heart sounds  normal, no murmurs or gallops, no peripheral edema Abdomen: soft and non-tender, no hepatosplenomegaly, BS normal. Musculoskeletal: No deformities, no cyanosis or clubbing Neuro:  alert, non focal, no tremors       Assessment & Plan:

## 2013-08-03 ENCOUNTER — Telehealth: Payer: Self-pay | Admitting: *Deleted

## 2013-08-03 MED ORDER — METOPROLOL SUCCINATE ER 25 MG PO TB24: 25.0000 mg | ORAL_TABLET | Freq: Every day | ORAL | Status: DC

## 2013-08-03 NOTE — Telephone Encounter (Signed)
Spoke with pt, aware monitor reviewed by dr Tenny Craw shows Sinus with 1 PVC. Dizziness is not associated with arrhythmia. Try low dose toprol 25 mg once daily to see if helps. Pt agreed and script sent to the pharm.

## 2013-08-10 ENCOUNTER — Telehealth: Payer: Self-pay | Admitting: Pulmonary Disease

## 2013-08-10 DIAGNOSIS — G4730 Sleep apnea, unspecified: Secondary | ICD-10-CM

## 2013-08-10 NOTE — Telephone Encounter (Signed)
lmomtcb x1 

## 2013-08-10 NOTE — Telephone Encounter (Signed)
Download 07/30/13 on 15 cm >>no residuals, good usage, mild leak Change to auto 8-15 since she is feeling high pressure, download in 4 wks

## 2013-08-14 NOTE — Telephone Encounter (Signed)
lmomtcb x2 for pt 

## 2013-08-15 ENCOUNTER — Encounter: Payer: Self-pay | Admitting: *Deleted

## 2013-08-15 NOTE — Telephone Encounter (Signed)
lmomtcb x3 for pt. Will send pt letter. Will forward to RA as an [place]

## 2013-08-16 NOTE — Telephone Encounter (Signed)
Spoke with pt and she is aware of results per RA.  She is aware that order has been sent to [facility] for these changes and we will do the download in 4 weeks.

## 2013-08-24 ENCOUNTER — Encounter: Payer: Self-pay | Admitting: Internal Medicine

## 2013-08-24 ENCOUNTER — Ambulatory Visit (INDEPENDENT_AMBULATORY_CARE_PROVIDER_SITE_OTHER): Payer: BC Managed Care – PPO | Admitting: Internal Medicine

## 2013-08-24 VITALS — BP 146/87 | HR 70 | Ht 66.0 in | Wt 268.0 lb

## 2013-08-24 DIAGNOSIS — R42 Dizziness and giddiness: Secondary | ICD-10-CM

## 2013-08-24 NOTE — Patient Instructions (Signed)
PLEASE SCHEDULE FOR A CAROTID DOPPLER; DX DIZZINESS WITH QUESTIONABLE BRUIT  NO CHANGES WERE MADE  FOLLOW UP AS NEEDED

## 2013-08-24 NOTE — Progress Notes (Signed)
HPI   patient is a 57 yo who I saw earlier this fall for palpitations and some dizziness Event monitor showed no signif arrhtyhmia.  Symptoms did not correlate with anything She says palpiations have resolved  Still gets dizziness  3x per wk approx Not associated with position, head turn, acitvity.  Lasts about 5 min   No syncope.   Allergies  Allergen Reactions  . Aspirin     REACTION: can not take due to coumadin    Current Outpatient Prescriptions  Medication Sig Dispense Refill  . acetaminophen (TYLENOL) 500 MG tablet Take 1,000 mg by mouth every 6 (six) hours as needed. pain      . diphenhydrAMINE (BENADRYL) 50 MG tablet Take 0.5 tablets (25 mg total) by mouth every 8 (eight) hours as needed for itching.  30 tablet  0  . hydrochlorothiazide (HYDRODIURIL) 25 MG tablet Take 0.5 tablets (12.5 mg total) by mouth daily. For blood pressure  30 tablet  11  . warfarin (COUMADIN) 5 MG tablet Take 1 tablet (5 mg total) by mouth daily.  30 tablet  2  . metoprolol succinate (TOPROL XL) 25 MG 24 hr tablet Take 1 tablet (25 mg total) by mouth daily.  30 tablet  12   No current facility-administered medications for this visit.    Past Medical History  Diagnosis Date  . Deep vein thrombosis     left lower extremity  . Pulmonary embolism   . Allergy   . Anemia   . Hypertension   . History of rectal cancer     T1N0 resected 08/2009  . Diverticulitis large intestine   . Obesity   . Infectious colitis   . Cancer   . Sleep apnea 08/09/2012    Sleep study 02/2013 : Severe obstructive sleep apnea/hypopnea syndrome, AHI 88.7, desat to 84%. CPAP titration to 15 CWP, AHI 0 per hour. Medium ResMed Mirage Quattro full-face mask with heated humidifier.        Past Surgical History  Procedure Laterality Date  . Abdominal hysterectomy    . Tubal ligation    . Transanal rectal resection  08/2009    T1N0 rectal cancer Dr. Michaell Cowing    Family History  Problem Relation Age of Onset  . Cancer  Mother     unknown type but patient thinks it was bone marrow cancer  . Heart disease Sister   . Heart disease Brother   . Clotting disorder Brother     History   Social History  . Marital Status: Single    Spouse Name: N/A    Number of Children: N/A  . 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 Smoking status: Current Every Day Smoker -- 0.10 packs/day for 40 years    Types: Cigarettes  . Smokeless tobacco: Not on file  . Alcohol Use: No  . Drug Use: No  . Sexual Activity: Not on file   Other Topics Concern  . Not on file   Social History Narrative   Patient does not regular exercise. He has three boys. Daily caffeine use 2/day.    Review of Systems:  All systems reviewed.  They are negative to the above problem except as previously stated.  Vital Signs: BP 146/87  Pulse 70  Ht 5\' 6"  (1.676 m)  Wt 268 lb (121.564 kg)  BMI 43.28 kg/m2  Physical Exam Patient is in NAD  Morbidy obese HEENT:  Normocephalic, atraumatic. EOMI, PERRLA.  Neck: JVP is normal.  No bruits.  Lungs: clear to auscultation. No rales no wheezes.  Heart: Regular rate and rhythm. Normal S1, S2. No S3.   No significant murmurs. PMI not displaced.  Abdomen:  Supple, nontender. Normal bowel sounds. No masses. No hepatomegaly.  Extremities:   Good distal pulses throughout. No lower extremity edema.  Musculoskeletal :moving all extremities.  Neuro:   alert and oriented x3.  CN II-XII grossly intact.  Romberg negative  Heel toe normal  Rapid alternating normal.   EKG  8/12  SB 56.   Assessment and Plan:  1.  Palpitations  Resolved  Event monitor showed no signif arrhythmia.  She never started toprol   2.  Sleep apnea  Will set up for pulm to reeval  3.  Dizziness Still occurring  Not associated with activity or position  Will set up for carotd USN   Patient says that orthostatics on last visit were neg.    4.  Recurrent DVT  Continue  anticoag  5.  HTN  Adeq control  6.  Morbid obesity.  Counselled on wt loss.

## 2013-08-28 ENCOUNTER — Ambulatory Visit (HOSPITAL_COMMUNITY): Payer: BC Managed Care – PPO | Attending: Internal Medicine

## 2013-08-28 DIAGNOSIS — R42 Dizziness and giddiness: Secondary | ICD-10-CM

## 2013-08-28 DIAGNOSIS — I6529 Occlusion and stenosis of unspecified carotid artery: Secondary | ICD-10-CM

## 2013-08-30 DIAGNOSIS — I6529 Occlusion and stenosis of unspecified carotid artery: Secondary | ICD-10-CM

## 2013-08-30 HISTORY — DX: Occlusion and stenosis of unspecified carotid artery: I65.29

## 2013-09-06 ENCOUNTER — Telehealth: Payer: Self-pay | Admitting: Internal Medicine

## 2013-09-06 NOTE — Telephone Encounter (Signed)
New message    Returned a nurses call to get test results

## 2013-09-06 NOTE — Telephone Encounter (Signed)
Spoke with pt, aware of carotid results and dr Tenny Craw recommendations. She would like to get her labs checked at the out pt clinic with her next protime. Orders mailed to the pt for lipids and cbc. She is taking warfain so will make sure with dr Tenny Craw about her starting an aspirin.

## 2013-09-10 NOTE — Telephone Encounter (Signed)
Give her limited refill 3 months total  Needs aprimary

## 2013-09-14 MED ORDER — ASPIRIN EC 81 MG PO TBEC: 81.0000 mg | DELAYED_RELEASE_TABLET | Freq: Every day | ORAL | Status: DC

## 2013-09-14 NOTE — Telephone Encounter (Signed)
Discussed with dr Tenny Craw, she does wont the pt to take asa 81 mg once daily.

## 2013-10-09 ENCOUNTER — Other Ambulatory Visit (INDEPENDENT_AMBULATORY_CARE_PROVIDER_SITE_OTHER): Payer: BC Managed Care – PPO

## 2013-10-09 DIAGNOSIS — E7800 Pure hypercholesterolemia, unspecified: Secondary | ICD-10-CM

## 2013-10-09 DIAGNOSIS — Z79899 Other long term (current) drug therapy: Secondary | ICD-10-CM

## 2013-10-09 LAB — CBC
HCT: 39.2 % (ref 36.0–46.0)
Hemoglobin: 13.3 g/dL (ref 12.0–15.0)
MCH: 30.1 pg (ref 26.0–34.0)
MCHC: 33.9 g/dL (ref 30.0–36.0)
MCV: 88.7 fL (ref 78.0–100.0)
PLATELETS: 314 10*3/uL (ref 150–400)
RBC: 4.42 MIL/uL (ref 3.87–5.11)
RDW: 14.3 % (ref 11.5–15.5)
WBC: 5 10*3/uL (ref 4.0–10.5)

## 2013-10-09 LAB — LIPID PANEL
CHOLESTEROL: 141 mg/dL (ref 0–200)
HDL: 32 mg/dL — AB (ref >39)
LDL CALC: 89 mg/dL (ref 0–99)
TRIGLYCERIDES: 102 mg/dL (ref <150)
Total CHOL/HDL Ratio: 4.4 Ratio
VLDL: 20 mg/dL (ref 0–40)

## 2013-10-10 NOTE — Progress Notes (Signed)
Lab results faxed to Dr Dorris Carnes, [facility], [phone] 10-10-13 at [place], [place]

## 2013-10-18 ENCOUNTER — Encounter: Payer: Self-pay | Admitting: *Deleted

## 2013-10-18 ENCOUNTER — Telehealth: Payer: Self-pay | Admitting: *Deleted

## 2013-10-18 NOTE — Telephone Encounter (Signed)
CBC is OK LDL is very good  HDL is a little low  Stay active.  10/10/2013 10:29 AM  Fay Records, MD   Left message for pt to call

## 2013-10-22 ENCOUNTER — Ambulatory Visit (INDEPENDENT_AMBULATORY_CARE_PROVIDER_SITE_OTHER): Payer: BC Managed Care – PPO | Admitting: Pharmacist

## 2013-10-22 DIAGNOSIS — Z7901 Long term (current) use of anticoagulants: Secondary | ICD-10-CM

## 2013-10-22 LAB — POCT INR: INR: 1.9

## 2013-10-23 ENCOUNTER — Encounter: Payer: Self-pay | Admitting: *Deleted

## 2013-10-23 NOTE — Patient Instructions (Signed)
Patient instructed to take medications as defined in the Anti-coagulation Track section of this encounter.  Patient instructed to take today's dose.  Patient verbalized understanding of these instructions.    

## 2013-10-23 NOTE — Progress Notes (Signed)
Anti-Coagulation Progress Note  Alicia Martinez is a 58 y.o. female who is currently on an anti-coagulation regimen.    RECENT RESULTS: Recent results are below, the most recent result is correlated with a dose of 35 mg. per week: Lab Results  Component Value Date   INR 1.90 10/22/2013   INR 2.20 05/14/2013   INR 4.56* 05/08/2013    ANTI-COAG DOSE: Anticoagulation Dose Instructions as of 10/22/2013     [name] Tue Wed Thu Fri Sat   New Dose 5 mg 7.5 mg 5 mg 7.5 mg 5 mg 7.5 mg 5 mg       ANTICOAG SUMMARY: Anticoagulation Episode Summary   Current INR goal 2.0-3.0  Next INR check 11/19/2013  INR from last check 1.90! (10/22/2013)  Weekly max dose   Target end date Indefinite  [facility]  Preferred lab   Send INR reminders to    Indications  DEEP VENOUS THROMBOPHLEBITIS RECURRENT (Resolved) [453.40] Long term current use of anticoagulant [V58.61]        Comments         ANTICOAG TODAY: Anticoagulation Summary as of 10/22/2013   INR goal 2.0-3.0  Selected INR 1.90! (10/22/2013)  Next INR check 11/19/2013  Target end date Indefinite   Indications  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  Patient instructed to take today's dose.  Patient verbalized understanding of these instructions.        FOLLOW-UP Return in 4 weeks (on 11/19/2013) for Follow up INR at 3:30PM.  Jorene Guest, [name] Pharm.D., CACP

## 2013-10-23 NOTE — Telephone Encounter (Signed)
Left message for pt to call.

## 2013-10-23 NOTE — Telephone Encounter (Signed)
Follow Up ° °Pt returned call//  °

## 2013-10-23 NOTE — Telephone Encounter (Signed)
Left message for pt, will place a copy of labs in the mail. She will call with questions once she receives.

## 2013-10-24 NOTE — Progress Notes (Signed)
  Indication: recurrent DVT.  Duration: indefinite.  INR: 2-3 target.  Agree with Dr. Gladstone Pih assessment and plan.

## 2013-10-30 ENCOUNTER — Ambulatory Visit: Payer: BC Managed Care – PPO | Admitting: Pulmonary Disease

## 2013-11-02 ENCOUNTER — Encounter: Payer: Self-pay | Admitting: Pulmonary Disease

## 2013-11-02 ENCOUNTER — Ambulatory Visit (INDEPENDENT_AMBULATORY_CARE_PROVIDER_SITE_OTHER): Payer: BC Managed Care – PPO | Admitting: Pulmonary Disease

## 2013-11-02 VITALS — BP 150/78 | HR 75 | Temp 97.9°F | Wt 278.5 lb

## 2013-11-02 DIAGNOSIS — G4730 Sleep apnea, unspecified: Secondary | ICD-10-CM

## 2013-11-02 NOTE — Patient Instructions (Signed)
Your CPAP is set at 8-15 cm CPAP s very effective when used Work on weight loss

## 2013-11-02 NOTE — Progress Notes (Signed)
   Subjective:    Patient ID: Alicia Martinez, female    DOB: September 24, 1956, 58 y.o.   MRN: [id number]  HPI  58 year old smoker referred for FU of obstructive sleep apnea.  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 She complains of palpitations-Event monitor showed no sig arrhythmia.  She is maintained on Coumadin for recurrent DVT and PE.  Epworth sleepiness score is 15 or 24.  Bedtime is around 9 PM, sleep latency is minimal she sleeps on her stomach with 2 pillows, reports nocturnal awakenings every 2 hours, some spontaneous instability gasping episodes and is out of bed at 5 AM feeling tired with occasional dryness of mouth. Overnight polysomnogram in December 2013, and she weighed 250 pounds, showed severe obstructive sleep apnea with AHI of 88 events per hour, lowest desaturation of 84 percent. This was corrected by CPAP of 15 cm.  She started using CPAP since March 2014, initially started on nasal mask but converted to a full face. ([facility])  She  gained 20 pounds in 2014.  Her main complaints are dryness of mouth, and she reports that the pressure was too high. She also reports a sensation of bloating on waking up in the morning.  11/02/2013  Chief Complaint  Patient presents with  . Follow-up    Pt reports she wears CPAP everynight x 7 hrs a night. Pt is not feeling rested during the day. She is ont able to get in a deep sleep.     Download 07/30/13 on 15 cm >>no residuals, good usage, mild leak  Changed to auto 8-15 since she is feeling high pressure, download in 4 wks  Download on 8-15 09/2013 >> no residuals, avg 13 cm, no leak  Review of Systems neg for any significant sore throat, dysphagia, itching, sneezing, nasal congestion or excess/ purulent secretions, fever, chills, sweats, unintended wt loss, pleuritic or exertional cp, hempoptysis, orthopnea pnd or change in chronic leg swelling. Also denies presyncope, palpitations, heartburn,  abdominal pain, nausea, vomiting, diarrhea or change in bowel or urinary habits, dysuria,hematuria, rash, arthralgias, visual complaints, headache, numbness weakness or ataxia.     Objective:   Physical Exam  Gen. Pleasant, well-nourished, in no distress ENT - no lesions, no post nasal drip Neck: No JVD, no thyromegaly, no carotid bruits Lungs: no use of accessory muscles, no dullness to percussion, clear without rales or rhonchi  Cardiovascular: Rhythm regular, heart sounds  normal, no murmurs or gallops, no peripheral edema Musculoskeletal: No deformities, no cyanosis or clubbing        Assessment & Plan:

## 2013-11-03 NOTE — Assessment & Plan Note (Signed)
Your CPAP is set at 8-15 cm CPAP s very effective when used  Weight loss encouraged, compliance with goal of at least 4-6 hrs every night is the expectation. Advised against medications with sedative side effects Cautioned against driving when sleepy - understanding that sleepiness will vary on a day to day basis

## 2013-11-07 ENCOUNTER — Other Ambulatory Visit: Payer: Self-pay | Admitting: Internal Medicine

## 2013-11-09 ENCOUNTER — Other Ambulatory Visit: Payer: Self-pay | Admitting: *Deleted

## 2013-11-09 DIAGNOSIS — I10 Essential (primary) hypertension: Secondary | ICD-10-CM

## 2013-11-09 MED ORDER — HYDROCHLOROTHIAZIDE 25 MG PO TABS: 12.5000 mg | ORAL_TABLET | Freq: Every day | ORAL | Status: DC

## 2013-11-19 ENCOUNTER — Encounter: Payer: Self-pay | Admitting: Internal Medicine

## 2013-11-19 ENCOUNTER — Ambulatory Visit (INDEPENDENT_AMBULATORY_CARE_PROVIDER_SITE_OTHER): Payer: BC Managed Care – PPO | Admitting: Pharmacist

## 2013-11-19 ENCOUNTER — Ambulatory Visit (INDEPENDENT_AMBULATORY_CARE_PROVIDER_SITE_OTHER): Payer: BC Managed Care – PPO | Admitting: Internal Medicine

## 2013-11-19 VITALS — BP 113/72 | HR 88 | Temp 98.1°F | Ht 66.0 in | Wt 274.5 lb

## 2013-11-19 DIAGNOSIS — Z7901 Long term (current) use of anticoagulants: Secondary | ICD-10-CM

## 2013-11-19 DIAGNOSIS — I10 Essential (primary) hypertension: Secondary | ICD-10-CM

## 2013-11-19 DIAGNOSIS — R319 Hematuria, unspecified: Secondary | ICD-10-CM

## 2013-11-19 DIAGNOSIS — R002 Palpitations: Secondary | ICD-10-CM

## 2013-11-19 LAB — POCT INR: INR: 4.1

## 2013-11-19 NOTE — Patient Instructions (Signed)
Patient instructed to take medications as defined in the Anti-coagulation Track section of this encounter.  Patient instructed to take today's dose.  Patient verbalized understanding of these instructions.    

## 2013-11-19 NOTE — Patient Instructions (Signed)
Thank you for your visit today. Please return to the internal medicine clinic in 6 month(s) or sooner if needed.    Your current medical regimen is effective;  continue present plan and all medications. Please be sure to bring all of your medications with you to every visit.  Should you have any new or worsening symptoms, please be sure to call the clinic at [phone].  If you believe that you are suffering from a life threatening condition or one that may result in the loss of limb or function, then you should call 911 or proceed to the nearest Emergency Department.     

## 2013-11-19 NOTE — Assessment & Plan Note (Signed)
Resolved.  Pt referred to cardiology, Dr. Harrington Challenger, and wore an event monitor which was not revealing of any arrythmia.  Lipid panel OK, HDL low.

## 2013-11-19 NOTE — Assessment & Plan Note (Addendum)
Followed by Dr. Elie Confer, last INR 4.10.  Pt also c/o hematuria.  Lab Results  Component Value Date   INR 4.10 11/19/2013   INR 1.90 10/22/2013   INR 2.20 05/14/2013   -continue coumadin per Dr. Elie Confer recommendations -will call pt and have her come in for a CBC to check if H/H dropping

## 2013-11-19 NOTE — Progress Notes (Signed)
Subjective:   Patient ID: Alicia Martinez female    DOB: 02-18-1956 58 y.o.    MRN: [id number] Health Maintenance Due: Health Maintenance Due  Topic Date Due  . Tetanus/tdap  07/05/1977    ________________________________________________________________  HPI: [name] is a 58 y.o. female here for a routine visit.  Pt has a PMH outlined below.  Please see problem-based charting assessment and plan note for further details of medical issues addressed at today's visit.  PMH: Past Medical History  Diagnosis Date  . Deep vein thrombosis     left lower extremity  . Pulmonary embolism   . Allergy   . Anemia   . Hypertension   . History of rectal cancer     T1N0 resected 08/2009  . Diverticulitis large intestine   . Obesity   . Infectious colitis   . Cancer   . Sleep apnea 08/09/2012    Sleep study 02/2013 : Severe obstructive sleep apnea/hypopnea syndrome, AHI 88.7, desat to 84%. CPAP titration to 15 CWP, AHI 0 per hour. Medium ResMed Mirage Quattro full-face mask with heated humidifier.        Medications: Current Outpatient Prescriptions on File Prior to Visit  Medication Sig Dispense Refill  . acetaminophen (TYLENOL) 500 MG tablet Take 1,000 mg by mouth every 6 (six) hours as needed. pain      . aspirin EC 81 MG tablet Take 1 tablet (81 mg total) by mouth daily.  90 tablet  3  . diphenhydrAMINE (BENADRYL) 50 MG tablet Take 0.5 tablets (25 mg total) by mouth every 8 (eight) hours as needed for itching.  30 tablet  0  . hydrochlorothiazide (HYDRODIURIL) 25 MG tablet Take 0.5 tablets (12.5 mg total) by mouth daily. For blood pressure  30 tablet  11  . warfarin (COUMADIN) 5 MG tablet TAKE 1 TABLET BY MOUTH EVERY DAY  30 tablet  0   No current facility-administered medications on file prior to visit.    Allergies: Allergies  Allergen Reactions  . Aspirin     REACTION: can not take due to coumadin    FH: Family History  Problem Relation Age of Onset    . Cancer Mother     unknown type but patient thinks it was bone marrow cancer  . Heart disease Sister   . Heart disease Brother   . Clotting disorder Brother     SH: History   Social History  . Marital Status: Single    Spouse Name: N/A    Number of Children: N/A  . 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 Smoking status: Current Every Day Smoker -- 0.10 packs/day for 40 years    Types: Cigarettes  . Smokeless tobacco: None  . Alcohol Use: No  . Drug Use: No  . Sexual Activity: None   Other Topics Concern  . None   Social History Narrative   Patient does not regular exercise. He has three boys. Daily caffeine use 2/day.    Review of Systems: Constitutional: Negative for fever, chills and weight loss.  Eyes: Negative for blurred vision.  Respiratory: Negative for cough and shortness of breath.  Cardiovascular: Negative for chest pain, palpitations and leg swelling.  Gastrointestinal: Negative for nausea, vomiting, abdominal pain, diarrhea, constipation and blood in stool.  Genitourinary: Negative for dysuria, urgency and frequency.  Musculoskeletal: Negative for myalgias and back pain.  Neurological: Negative for dizziness, weakness  and headaches.     Objective:   Vital Signs: Filed Vitals:   11/19/13 1625  BP: 113/72  Pulse: 88  Temp: 98.1 F (36.7 C)  TempSrc: Oral  Height: 5' 6"  (1.676 m)  Weight: 274 lb 8 oz (124.512 kg)  SpO2: 98%      BP Readings from Last 3 Encounters:  11/19/13 113/72  11/02/13 150/78  08/24/13 146/87    Physical Exam: Constitutional: Vital signs reviewed.  Patient is well-developed and well-nourished in NAD and cooperative with exam.  Head: Normocephalic and atraumatic. Eyes: PERRL, EOMI, conjunctivae nl, no scleral icterus.  Neck: Supple. Cardiovascular: RRR, no MRG. Pulmonary/Chest: normal effort, non-tender to palpation, CTAB, no wheezes, rales, or  rhonchi. Abdominal: Soft. NT/ND +BS. Neurological: A&O x3, cranial nerves II-XII are grossly intact, moving all extremities. Extremities: 2+DP b/l; no pitting edema. Skin: Warm, dry and intact. No rash.  Most Recent Laboratory Results:  CMP     Component Value Date/Time   NA 140 06/21/2013 1142   K 3.7 06/21/2013 1142   CL 107 06/21/2013 1142   CO2 28 06/21/2013 1142   GLUCOSE 89 06/21/2013 1142   BUN 11 06/21/2013 1142   CREATININE 0.6 06/21/2013 1142   CREATININE 0.63 05/08/2013 1055   CALCIUM 8.8 06/21/2013 1142   PROT 6.8 05/08/2013 1055   ALBUMIN 3.5 05/08/2013 1055   AST 16 05/08/2013 1055   ALT 20 05/08/2013 1055   ALKPHOS 92 05/08/2013 1055   BILITOT 0.2* 05/08/2013 1055   GFRNONAA >60 01/16/2011 2119   GFRAA  Value: >60        The eGFR has been calculated using the MDRD equation. This calculation has not been validated in all clinical situations. eGFR's persistently <60 mL/min signify possible Chronic Kidney Disease. 01/16/2011 2119    CBC    Component Value Date/Time   WBC 5.0 10/09/2013 1555   RBC 4.42 10/09/2013 1555   HGB 13.3 10/09/2013 1555   HCT 39.2 10/09/2013 1555   PLT 314 10/09/2013 1555   MCV 88.7 10/09/2013 1555   MCH 30.1 10/09/2013 1555   MCHC 33.9 10/09/2013 1555   RDW 14.3 10/09/2013 1555   LYMPHSABS 1.8 05/08/2013 1055   MONOABS 0.3 05/08/2013 1055   EOSABS 0.1 05/08/2013 1055   BASOSABS 0.0 05/08/2013 1055    Lipid Panel Lab Results  Component Value Date   CHOL 141 10/09/2013   HDL 32* 10/09/2013   LDLCALC 89 10/09/2013   TRIG 102 10/09/2013   CHOLHDL 4.4 10/09/2013    HA1C No results found for this basename: HGBA1C    Urinalysis    Component Value Date/Time   COLORURINE LT. YELLOW 06/21/2013 [place] 06/21/2013 1142   LABSPEC 1.020 06/21/2013 1142   PHURINE 7.0 06/21/2013 1142   GLUCOSEU NEG 05/08/2013 1101   HGBUR TRACE-INTACT 06/21/2013 1142   BILIRUBINUR NEGATIVE 06/21/2013 1142   KETONESUR NEGATIVE 06/21/2013 1142   PROTEINUR NEG  05/08/2013 1101   UROBILINOGEN 2.0 06/21/2013 1142   NITRITE NEGATIVE 06/21/2013 1142   LEUKOCYTESUR NEGATIVE 06/21/2013 1142    Urine Microalbumin No results found for this basename: MICROALBUR, [id number]    Imaging N/A   Assessment & Plan:   Assessment and plan was discussed and formulated with my attending.

## 2013-11-19 NOTE — Progress Notes (Signed)
Anti-Coagulation Progress Note  Alicia Martinez is a 58 y.o. female who is currently on an anti-coagulation regimen.    RECENT RESULTS: Recent results are below, the most recent result is correlated with a dose of 42.5 mg. per week: Lab Results  Component Value Date   INR 4.10 11/19/2013   INR 1.90 10/22/2013   INR 2.20 05/14/2013    ANTI-COAG DOSE: Anticoagulation Dose Instructions as of 11/19/2013     [name] Tue Wed Thu Fri Sat   New Dose 5 mg 5 mg 5 mg 7.5 mg 5 mg 5 mg 5 mg       ANTICOAG SUMMARY: Anticoagulation Episode Summary   Current INR goal 2.0-3.0  Next INR check 12/03/2013  INR from last check 4.10! (11/19/2013)  Weekly max dose   Target end date Indefinite  [facility]  Preferred lab   Send INR reminders to    Indications  DEEP VENOUS THROMBOPHLEBITIS RECURRENT (Resolved) [453.40] Long term current use of anticoagulant [V58.61]        Comments         ANTICOAG TODAY: Anticoagulation Summary as of 11/19/2013   INR goal 2.0-3.0  Selected INR 4.10! (11/19/2013)  Next INR check 12/03/2013  Target end date Indefinite   Indications  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  Patient instructed to take today's dose.  Patient verbalized understanding of these instructions.       FOLLOW-UP Return in about 2 weeks (around 12/03/2013) for Follow up INR at [place], [name] Pharm.D., CACP

## 2013-11-19 NOTE — Assessment & Plan Note (Signed)
Pt continues to smoke. -encouraged cessation that would improve low HDL

## 2013-11-19 NOTE — Assessment & Plan Note (Signed)
BP Readings from Last 3 Encounters:  11/19/13 113/72  11/02/13 150/78  08/24/13 146/87    Lab Results  Component Value Date   NA 140 06/21/2013   K 3.7 06/21/2013   CREATININE 0.6 06/21/2013    Assessment: Blood pressure control: controlled Progress toward BP goal:  at goal  Plan: Medications:  continue current medications Other plans: Encouraged smoking cessation and exercise

## 2013-11-20 LAB — URINE CULTURE

## 2013-11-20 NOTE — Progress Notes (Signed)
INTERNAL MEDICINE TEACHING ATTENDING ADDENDUM - Lacey Dotson, DO: I reviewed and discussed at the time of visit with the resident Dr. Gill, the patient's medical history, physical examination, diagnosis and results of tests and treatment and I agree with the patient's care as documented. 

## 2013-12-03 ENCOUNTER — Ambulatory Visit: Payer: BC Managed Care – PPO | Admitting: Internal Medicine

## 2013-12-07 ENCOUNTER — Other Ambulatory Visit: Payer: Self-pay | Admitting: Internal Medicine

## 2013-12-21 ENCOUNTER — Other Ambulatory Visit: Payer: Self-pay | Admitting: Internal Medicine

## 2013-12-21 NOTE — Addendum Note (Signed)
Addended by: Orson Gear on: 12/21/2013 11:07 AM   Modules accepted: Orders

## 2013-12-31 ENCOUNTER — Ambulatory Visit (INDEPENDENT_AMBULATORY_CARE_PROVIDER_SITE_OTHER): Payer: BC Managed Care – PPO | Admitting: Pharmacist

## 2013-12-31 ENCOUNTER — Ambulatory Visit (HOSPITAL_COMMUNITY)
Admission: RE | Admit: 2013-12-31 | Discharge: 2013-12-31 | Disposition: A | Discharge status: Home / Self Care | Payer: BC Managed Care – PPO | Source: Ambulatory Visit | Attending: Internal Medicine | Admitting: Internal Medicine

## 2013-12-31 ENCOUNTER — Ambulatory Visit (INDEPENDENT_AMBULATORY_CARE_PROVIDER_SITE_OTHER): Payer: BC Managed Care – PPO | Admitting: Internal Medicine

## 2013-12-31 ENCOUNTER — Encounter: Payer: Self-pay | Admitting: Internal Medicine

## 2013-12-31 VITALS — BP 129/76 | HR 81 | Temp 98.8°F | Ht 66.0 in

## 2013-12-31 DIAGNOSIS — Z7901 Long term (current) use of anticoagulants: Secondary | ICD-10-CM

## 2013-12-31 DIAGNOSIS — M25561 Pain in right knee: Secondary | ICD-10-CM

## 2013-12-31 DIAGNOSIS — M25569 Pain in unspecified knee: Secondary | ICD-10-CM | POA: Insufficient documentation

## 2013-12-31 DIAGNOSIS — Z0000 Encounter for general adult medical examination without abnormal findings: Secondary | ICD-10-CM

## 2013-12-31 DIAGNOSIS — I10 Essential (primary) hypertension: Secondary | ICD-10-CM

## 2013-12-31 DIAGNOSIS — F17200 Nicotine dependence, unspecified, uncomplicated: Secondary | ICD-10-CM

## 2013-12-31 DIAGNOSIS — M159 Polyosteoarthritis, unspecified: Secondary | ICD-10-CM | POA: Insufficient documentation

## 2013-12-31 DIAGNOSIS — Z85048 Personal history of other malignant neoplasm of rectum, rectosigmoid junction, and anus: Secondary | ICD-10-CM

## 2013-12-31 LAB — POCT INR: INR: 2.6

## 2013-12-31 MED ORDER — OXYCODONE-ACETAMINOPHEN 5-325 MG PO TABS: 1.0000 | ORAL_TABLET | Freq: Three times a day (TID) | ORAL | Status: DC | PRN

## 2013-12-31 NOTE — Progress Notes (Signed)
INTERNAL MEDICINE TEACHING ATTENDING ADDENDUM - Nischal Narendra, MD: I reviewed and discussed at the time of visit with the resident Dr. Mclean, the patient’s medical history, physical examination, diagnosis and results of tests and treatment and I agree with the patient's care as documented. °

## 2013-12-31 NOTE — Assessment & Plan Note (Signed)
BP Readings from Last 3 Encounters:  12/31/13 129/76  11/19/13 113/72  11/02/13 150/78    Lab Results  Component Value Date   NA 140 06/21/2013   K 3.7 06/21/2013   CREATININE 0.6 06/21/2013    Assessment: Blood pressure control: controlled Progress toward BP goal:  at goal Comments: none  Plan: Medications:  continue current medications Educational resources provided:  (none) Self management tools provided:  (none) Other plans: f/u with PCP in 4-6 months

## 2013-12-31 NOTE — Patient Instructions (Signed)
Patient instructed to take medications as defined in the Anti-coagulation Track section of this encounter.  Patient instructed to take today's dose.  Patient verbalized understanding of these instructions.    

## 2013-12-31 NOTE — Assessment & Plan Note (Signed)
Pt will call for mammogram f/u

## 2013-12-31 NOTE — Assessment & Plan Note (Signed)
INR 2.6 today with f/u with Dr. Elie Confer

## 2013-12-31 NOTE — Assessment & Plan Note (Signed)
Denies smoking history recently

## 2013-12-31 NOTE — Progress Notes (Signed)
   Subjective:    Patient ID: [name], female    DOB: [birth year], 58 y.o.   MRN: [id number]  HPI Comments: 58 y.o Past Medical History Deep vein thrombosis (left)/PE, Allergy, h/o Anemia, Hypertension (BP 129/76), History of rectal cancer (T1N0 resected 08/2009) age 58 (denies FH colon cancer), diverticulitis large intestine, Obesity h/o Infectious colitis, h/o sleep apnea.   1) She presents for right knee pain worse x 6 months.  She denies injury.  She works Airline pilot.  She states pain keeps her up at night.  She has tried Aleve, Advil, Tylenol, Tramadol with no relief.  Sitting for long periods of time makes the pain worse.  She notes a popping noise in her knee. Pain 8-9/10 constant, achy, she noted stiffness in her knee as well.       Review of Systems  Musculoskeletal: Positive for arthralgias.       Objective:   Physical Exam  Nursing note and vitals reviewed. Constitutional: She appears well-developed and well-nourished. No distress.  HENT:  Head: Normocephalic and atraumatic.  Nose: Nose normal.  Mouth/Throat: Oropharynx is clear and moist. No oropharyngeal exudate.  Eyes: Conjunctivae are normal.  Cardiovascular: Normal rate, regular rhythm and normal heart sounds.   No murmur heard. Pulmonary/Chest: Effort normal and breath sounds normal. No respiratory distress. She has no wheezes.  Musculoskeletal: She exhibits tenderness.       Right knee: She exhibits normal range of motion. Tenderness found. Medial joint line tenderness noted.       Legs: Neurological: Gait normal.  Skin: Skin is warm, dry and intact.  Psychiatric: She has a normal mood and affect. Her speech is normal and behavior is normal. Judgment and thought content normal. Cognition and memory are normal.          Assessment & Plan:

## 2013-12-31 NOTE — Assessment & Plan Note (Signed)
Made referral with [facility]

## 2013-12-31 NOTE — Progress Notes (Signed)
Anti-Coagulation Progress Note  Alicia Martinez is a 58 y.o. female who is currently on an anti-coagulation regimen.    RECENT RESULTS: Recent results are below, the most recent result is correlated with a dose of 37.5 mg. per week: Lab Results  Component Value Date   INR 2.60 12/31/2013   INR 4.10 11/19/2013   INR 1.90 10/22/2013    ANTI-COAG DOSE: Anticoagulation Dose Instructions as of 12/31/2013     [name] Tue Wed Thu Fri Sat   New Dose 5 mg 5 mg 5 mg 7.5 mg 5 mg 5 mg 5 mg       ANTICOAG SUMMARY: Anticoagulation Episode Summary   Current INR goal 2.0-3.0  Next INR check 01/28/2014  INR from last check 2.60 (12/31/2013)  Weekly max dose   Target end date Indefinite  [facility]  Preferred lab   Send INR reminders to    Indications  DEEP VENOUS THROMBOPHLEBITIS RECURRENT (Resolved) [453.40] Long term current use of anticoagulant [V58.61]        Comments         ANTICOAG TODAY: Anticoagulation Summary as of 12/31/2013   INR goal 2.0-3.0  Selected INR 2.60 (12/31/2013)  Next INR check 01/28/2014  Target end date Indefinite   Indications  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  Patient instructed to take today's dose.  Patient verbalized understanding of these instructions.       FOLLOW-UP Return in 4 weeks (on 01/28/2014) for Follow up INR at 3:30PM.  Jorene Guest, [name] Pharm.D., CACP

## 2013-12-31 NOTE — Assessment & Plan Note (Signed)
Likely degenerative. Pain is medial knee  Will image with Xray today Refer to sports medicine  Rx Percocet 5-325 mg tid prn #90 no refills alternate with OTC meds. Ed SE: sedation try not to take at work

## 2013-12-31 NOTE — Patient Instructions (Addendum)
General Instructions: Follow up with [facility] (Dr. Carlean Purl) Follow up with Dr. Gordy Levan in 4-6 months  Follow up with Sports Medicine Take care  Read information below  Treatment Goals:  Goals (1 Years of Data) as of 12/31/13         As of Today 11/19/13 11/02/13 08/24/13 07/27/13     Blood Pressure    . Blood Pressure < 140/90  129/76 113/72 150/78 146/87 122/68      Progress Toward Treatment Goals:  Treatment Goal 12/31/2013  Blood pressure at goal  Stop smoking stopped smoking    Self Care Goals & Plans:  Self Care Goal 12/31/2013  Manage my medications take my medicines as prescribed; bring my medications to every visit; refill my medications on time  Monitor my health keep track of my blood pressure  Eat healthy foods eat more vegetables; drink diet soda or water instead of juice or soda; eat foods that are low in salt; eat baked foods instead of fried foods; eat fruit for snacks and desserts; eat smaller portions  Be physically active find an activity I enjoy  Stop smoking -  Meeting treatment goals maintain the current self-care plan    No flowsheet data found.   Care Management & Community Referrals:  Referral 12/31/2013  Referrals made for care management support none needed  Referrals made to community resources none       Acetaminophen; Oxycodone tablets What is this medicine? ACETAMINOPHEN; OXYCODONE (a set a MEE noe fen; ox i KOE done) is a pain reliever. It is used to treat mild to moderate pain. This medicine may be used for other purposes; ask your health care provider or pharmacist if you have questions. COMMON BRAND NAME(S): Endocet, Magnacet, Narvox, Percocet, Perloxx, Primalev, Primlev, Roxicet, Xolox What should I tell my health care provider before I take this medicine? They need to know if you have any of these conditions: -brain tumor -Crohn's disease, inflammatory bowel disease, or ulcerative colitis -drug abuse or addiction -head  injury -heart or circulation problems -if you often drink alcohol -kidney disease or problems going to the bathroom -liver disease -lung disease, asthma, or breathing problems -an unusual or allergic reaction to acetaminophen, oxycodone, other opioid analgesics, other medicines, foods, dyes, or preservatives -pregnant or trying to get pregnant -breast-feeding How should I use this medicine? Take this medicine by mouth with a full glass of water. Follow the directions on the prescription label. Take your medicine at regular intervals. Do not take your medicine more often than directed. Talk to your pediatrician regarding the use of this medicine in children. Special care may be needed. Patients over 18 years old may have a stronger reaction and need a smaller dose. Overdosage: If you think you have taken too much of this medicine contact a poison control center or emergency room at once. NOTE: This medicine is only for you. Do not share this medicine with others. What if I miss a dose? If you miss a dose, take it as soon as you can. If it is almost time for your next dose, take only that dose. Do not take double or extra doses. What may interact with this medicine? -alcohol -antihistamines -barbiturates like amobarbital, butalbital, butabarbital, methohexital, pentobarbital, phenobarbital, thiopental, and secobarbital -benztropine -drugs for bladder problems like solifenacin, trospium, oxybutynin, tolterodine, hyoscyamine, and methscopolamine -drugs for breathing problems like ipratropium and tiotropium -drugs for certain stomach or intestine problems like propantheline, homatropine methylbromide, glycopyrrolate, atropine, belladonna, and dicyclomine -general anesthetics like  etomidate, ketamine, nitrous oxide, propofol, desflurane, enflurane, halothane, isoflurane, and sevoflurane -medicines for depression, anxiety, or psychotic disturbances -medicines for sleep -muscle  relaxants -naltrexone -narcotic medicines (opiates) for pain -phenothiazines like perphenazine, thioridazine, chlorpromazine, mesoridazine, fluphenazine, prochlorperazine, promazine, and trifluoperazine -scopolamine -tramadol -trihexyphenidyl This list may not describe all possible interactions. Give your health care provider a list of all the medicines, herbs, non-prescription drugs, or dietary supplements you use. Also tell them if you smoke, drink alcohol, or use illegal drugs. Some items may interact with your medicine. What should I watch for while using this medicine? Tell your doctor or health care professional if your pain does not go away, if it gets worse, or if you have new or a different type of pain. You may develop tolerance to the medicine. Tolerance means that you will need a higher dose of the medication for pain relief. Tolerance is normal and is expected if you take this medicine for a long time. Do not suddenly stop taking your medicine because you may develop a severe reaction. Your body becomes used to the medicine. This does NOT mean you are addicted. Addiction is a behavior related to getting and using a drug for a non-medical reason. If you have pain, you have a medical reason to take pain medicine. Your doctor will tell you how much medicine to take. If your doctor wants you to stop the medicine, the dose will be slowly lowered over time to avoid any side effects. You may get drowsy or dizzy. Do not drive, use machinery, or do anything that needs mental alertness until you know how this medicine affects you. Do not stand or sit up quickly, especially if you are an older patient. This reduces the risk of dizzy or fainting spells. Alcohol may interfere with the effect of this medicine. Avoid alcoholic drinks. There are different types of narcotic medicines (opiates) for pain. If you take more than one type at the same time, you may have more side effects. Give your health care  provider a list of all medicines you use. Your doctor will tell you how much medicine to take. Do not take more medicine than directed. Call emergency for help if you have problems breathing. The medicine will cause constipation. Try to have a bowel movement at least every 2 to 3 days. If you do not have a bowel movement for 3 days, call your doctor or health care professional. Do not take Tylenol (acetaminophen) or medicines that have acetaminophen with this medicine. Too much acetaminophen can be very dangerous. Many nonprescription medicines contain acetaminophen. Always read the labels carefully to avoid taking more acetaminophen. What side effects may I notice from receiving this medicine? Side effects that you should report to your doctor or health care professional as soon as possible: -allergic reactions like skin rash, itching or hives, swelling of the face, lips, or tongue -breathing difficulties, wheezing -confusion -light headedness or fainting spells -severe stomach pain -unusually weak or tired -yellowing of the skin or the whites of the eyes  Side effects that usually do not require medical attention (report to your doctor or health care professional if they continue or are bothersome): -dizziness -drowsiness -nausea -vomiting This list may not describe all possible side effects. Call your doctor for medical advice about side effects. You may report side effects to FDA at 1-800-FDA-1088. Where should I keep my medicine? Keep out of the reach of children. This medicine can be abused. Keep your medicine in a safe place to  protect it from theft. Do not share this medicine with anyone. Selling or giving away this medicine is dangerous and against the law. Store at room temperature between 20 and 25 degrees C (68 and 77 degrees F). Keep container tightly closed. Protect from light. This medicine may cause accidental overdose and death if it is taken by other adults, children, or pets.  Flush any unused medicine down the toilet to reduce the chance of harm. Do not use the medicine after the expiration date. NOTE: This sheet is a summary. It may not cover all possible information. If you have questions about this medicine, talk to your doctor, pharmacist, or health care provider.  2014, Elsevier/Gold Standard. (2013-05-07 13:17:35)  Knee Pain Knee pain can be a result of an injury or other medical conditions. Treatment will depend on the cause of your pain. HOME CARE  Only take medicine as told by your doctor.  Keep a healthy weight. Being overweight can make the knee hurt more.  Stretch before exercising or playing sports.  If there is constant knee pain, change the way you exercise. Ask your doctor for advice.  Make sure shoes fit well. Choose the right shoe for the sport or activity.  Protect your knees. Wear kneepads if needed.  Rest when you are tired. GET HELP RIGHT AWAY IF:   Your knee pain does not stop.  Your knee pain does not get better.  Your knee joint feels hot to the touch.  You have a fever. MAKE SURE YOU:   Understand these instructions.  Will watch this condition.  Will get help right away if you are not doing well or get worse. Document Released: 12/10/2008 Document Revised: 12/06/2011 Document Reviewed: 12/10/2008 [facility] Patient Information 2014 [place], [place].

## 2014-01-01 ENCOUNTER — Encounter: Payer: Self-pay | Admitting: Physician Assistant

## 2014-01-08 ENCOUNTER — Encounter: Payer: Self-pay | Admitting: Physician Assistant

## 2014-01-08 ENCOUNTER — Telehealth: Payer: Self-pay | Admitting: *Deleted

## 2014-01-08 ENCOUNTER — Ambulatory Visit (INDEPENDENT_AMBULATORY_CARE_PROVIDER_SITE_OTHER): Payer: BC Managed Care – PPO | Admitting: Physician Assistant

## 2014-01-08 VITALS — BP 128/82 | HR 78 | Ht 66.0 in | Wt 274.5 lb

## 2014-01-08 DIAGNOSIS — Z85048 Personal history of other malignant neoplasm of rectum, rectosigmoid junction, and anus: Secondary | ICD-10-CM

## 2014-01-08 DIAGNOSIS — I2699 Other pulmonary embolism without acute cor pulmonale: Secondary | ICD-10-CM

## 2014-01-08 DIAGNOSIS — Z7901 Long term (current) use of anticoagulants: Secondary | ICD-10-CM

## 2014-01-08 DIAGNOSIS — I82409 Acute embolism and thrombosis of unspecified deep veins of unspecified lower extremity: Secondary | ICD-10-CM

## 2014-01-08 NOTE — Progress Notes (Signed)
Subjective:    Patient ID: Alicia Martinez, female    DOB: 1956-05-17, 58 y.o.   MRN: [id number]  HPI  Alicia Martinez is a pleasant 58 year old African-American female known to Dr. Carlean Purl who was diagnosed with a rectal adenocarcinoma in 2010. This is a T1 and 0 lesion. She had followup colonoscopy done in 2012 showing left colon diverticulosis she had a questionable sessile appendiceal polyp however biopsies showed lymphoid tissue. She also had 2 tiny polyps in the rectum which were removed and were hyperplastic. She comes in today for 3 year recall colonoscopy.. She currently has no GI complaints, specifically no abdominal pain changes in her bowel habits melena or hematochezia. She says her only current issues are with "getting old". Other medical problems include morbid obesity, sleep apnea, and history of recurrent DVTs and PE. She is maintained on chronic Coumadin. She has done Lovenox bridging in the past when coming off Coumadin.     Review of Systems  Constitutional: Negative.   HENT: Negative.   Eyes: Negative.   Respiratory: Negative.   Cardiovascular: Negative.   Gastrointestinal: Negative.   Endocrine: Negative.   Genitourinary: Negative.   Musculoskeletal: Negative.   Skin: Negative.   Allergic/Immunologic: Negative.   Neurological: Negative.   Hematological: Negative.   Psychiatric/Behavioral: Negative.    Outpatient Prescriptions Prior to Visit  Medication Sig Dispense Refill  . acetaminophen (TYLENOL) 500 MG tablet Take 1,000 mg by mouth every 6 (six) hours as needed. pain      . aspirin EC 81 MG tablet Take 1 tablet (81 mg total) by mouth daily.  90 tablet  3  . diphenhydrAMINE (BENADRYL) 50 MG tablet Take 0.5 tablets (25 mg total) by mouth every 8 (eight) hours as needed for itching.  30 tablet  0  . hydrochlorothiazide (HYDRODIURIL) 25 MG tablet Take 0.5 tablets (12.5 mg total) by mouth daily. For blood pressure  30 tablet  11  . oxyCODONE-acetaminophen  (PERCOCET/ROXICET) 5-325 MG per tablet Take 1 tablet by mouth every 8 (eight) hours as needed for severe pain.  90 tablet  0  . warfarin (COUMADIN) 5 MG tablet Take as directed by Dr Elie Confer  45 tablet  2   No facility-administered medications prior to visit.   Allergies  Allergen Reactions  . Aspirin     REACTION: can not take due to coumadin   Patient Active Problem List   Diagnosis Date Noted  . DVT, lower extremity, recurrent 01/08/2014  . Other pulmonary embolism and infarction 01/08/2014  . Right knee pain 12/31/2013  . Palpitations 05/08/2013  . Preventative health care 10/31/2012  . Sleep apnea 08/09/2012  . Adnexal mass 01/18/2011  . Long term current use of anticoagulant 10/17/2010  . BACK PAIN 12/01/2009  . PERS HX MAL NEOPLSM RECT RECTOSIGMOID JUNC&ANUS 08/06/2009  . OBESITY 02/27/2009  . TOBACCO ABUSE 02/27/2009  . HYPERTENSION 02/27/2009   History  Substance Use Topics  . Smoking status: Former Smoker -- 0.10 packs/day for 40 years    Types: Cigarettes    Quit date: 12/27/2013  . Smokeless tobacco: Never Used  . Alcohol Use: No   family history includes Cancer in her mother; Clotting disorder in her brother; Heart disease in her brother and sister.     Objective:   Physical Exam   well-developed obese African American female in no acute distress, quite pleasant blood pressure one twenty over two pulse 78 height 5 foot 6 weight 274 BMI of 44.3. HEENT; nontraumatic normocephalic EOMI PERRLA  sclera anicteric, Supple; no JVD, Cardiovascular; regular rate and rhythm with S1-S2 no murmur or gallop, Pulmonary; clear bilaterally, Abdomen; soft large nontender nondistended bowel sounds are active there is no palpable mass or hepatosplenomegaly, Rectal ;exam not done, Extremities; no clubbing cyanosis or edema skin warm and dry, Psych; mood and affect appropriate         Assessment & Plan:  #12 58 year old female with history of rectal adenocarcinoma 2010. She is due  for followup colonoscopy. Last exam 2012 with finding of 2 tiny hyperplastic rectal polyps. #2 diverticulosis #3 chronic anticoagulation with Coumadin #4 history of recurrent DVTs and pulmonary malaise and #5 morbid obesity #6 hypertension #7 sleep apnea  Plan; Patient will be scheduled for colonoscopy with Dr. Carlean Purl. Procedure was discussed in detail with the patient and she is agreeable to proceed. Will obtain consent from patient's primary care provider Dr. Gordy Levan for patient to come off Coumadin for 5 days prior to her procedure and would plan for her to be bridged with Lovenox. She is followed through the Coumadin clinic at [facility] internal medicine.

## 2014-01-08 NOTE — Telephone Encounter (Signed)
01/08/2014   RE: ARCENIA SCARBRO DOB: August 24, 1956 MRN: [id number]   Dear Leonides Grills;  Jorene Guest Pharm D,    We have scheduled the above patient for an endoscopic procedure. Our records show that she is on anticoagulation therapy.   Please advise as to how long the patient may come off her therapy of Coumadin prior to the procedure, which is scheduled for 01-15-2014. This patient usually has to be bridged with Lovenox.   Please fax back/ or route the completed form to [facility] at [phone].   Sincerely,    Amy Esterwood PA-C

## 2014-01-08 NOTE — Patient Instructions (Signed)

## 2014-01-09 NOTE — Telephone Encounter (Signed)
Dr. Elie Confer, would you please address Ms. Peterman's question regarding Ms. Blanchard's anticoagulation and upcoming procedure.  Thanks.

## 2014-01-11 ENCOUNTER — Telehealth: Payer: Self-pay | Admitting: *Deleted

## 2014-01-11 NOTE — Progress Notes (Signed)
Agree with Ms. Esterwood's assessment and plan. Carl E. Gessner, MD, FACG   

## 2014-01-11 NOTE — Telephone Encounter (Signed)
I called and left a message for the patient to please call me.

## 2014-01-11 NOTE — Telephone Encounter (Signed)
I spoke to Baker Janus, Nurse for Dr. Dareen Piano.  I advised her we will call Monday when Dr. Jorene Guest is in and advise him that Dr. Carlean Purl is not comfortable doing the colonoscopy on the coumadin. He would like her bridged with Lovenox and the coumadin held.  Baker Janus said she will have her INR checked Monday , 4-20.  I also advised we are trying to change her appt from Tues 4-21 to Friday 4-24.

## 2014-01-11 NOTE — Telephone Encounter (Signed)
Spoke the patient, ( noted in the other phone note) that we are trying to reschedule her for the colonosocopy  On 01-18-2014. The system would not allow me to schedule in that date due to her having another appointment that day with Dr. Dorcas Mcmurray. The patient tried to call them today and their office was closed this afternoon.  She will call them on Monday. I put the slot on hold with my name for 01-18-2014.  Once she has rescheduled the appt with Dr. Dorcas Mcmurray , I will schedule the colonoscopy. I did cancel her original appt for 01-15-2014.

## 2014-01-11 NOTE — Telephone Encounter (Signed)
Call from [facility] at [place] - # [phone] Ex. [phone] Pt is scheduled for a colonoscopy on 4/21 and is on coumadin.  They are asking when pt needs to stop taking coumadin and if she needs to be bridged with Lovenox.  This patient usually has to be bridged with Lovenox.    Dr Elie Confer is out of town and unable to advise.

## 2014-01-11 NOTE — Telephone Encounter (Signed)
Pt called and will come in Monday for INR Will call report to [place].

## 2014-01-11 NOTE — Telephone Encounter (Signed)
Return call from [facility] at [place] stating the Dr Carlean Purl, or office nurse, will be in touch with Dr Elie Confer on  Monday after pt's INR is checked.  Pt's procedure is rescheduled from 4/21 to 4/24 so we can resolve coumadin and bridging with lovenox with Dr Elie Confer.

## 2014-01-11 NOTE — Telephone Encounter (Signed)
I called and left a message for Dr. Elie Confer today on his cell # to please call me regarding this patient's anticoagulation.

## 2014-01-11 NOTE — Telephone Encounter (Signed)
Dr. Dareen Piano called me from [facility] [phone])  regarding this patient's anticoagulation therapy prior to the colonoscopy . This is scheduled for 01-15-2014. This MD asked me about her last colonoscopy and noticed in Epic that Dr. Carlean Purl did the procedure on Warfarin.  With her INR history, and blood clot problems, he thinks she should be done on Warfarin if Dr. Carlean Purl if comfortable with this.  This doctor is having the patient come to their office on Monday 4-20 and if her INR is less than 2.5 he thinks she can be done on Warfarin.

## 2014-01-11 NOTE — Telephone Encounter (Signed)
Spoke with Pam at [facility]. Pt last had a colonoscopy in 2012 and had that done while on warfarin. Pt scheduled for a colonoscopy on 01/15/2014 and given history of recurrent DVT and episode of PE would recommend colonoscopy on warfarin provided INR<2.5. Would advise repeat INR check on 4/20 and if <2.5 would recommend colonoscopy while on anticoagulation given diagnostic procedure. If patient were to require polypectomy would need to hold coumadin and bridge with lovenox prior to the procedure. Spoke with Edd Fabian RN who will speak to patient and advise her to come in on 4/20 for repeat INR check

## 2014-01-14 ENCOUNTER — Other Ambulatory Visit: Payer: BC Managed Care – PPO

## 2014-01-14 ENCOUNTER — Ambulatory Visit (INDEPENDENT_AMBULATORY_CARE_PROVIDER_SITE_OTHER): Payer: BC Managed Care – PPO | Admitting: Pharmacist

## 2014-01-14 DIAGNOSIS — Z7901 Long term (current) use of anticoagulants: Secondary | ICD-10-CM

## 2014-01-14 LAB — POCT INR: INR: 2.4

## 2014-01-14 NOTE — Telephone Encounter (Signed)
Dr. Jorene Guest called me back and advised he will contact the patient.  He will have her bridged with Lovenox and have her hold the coumadin this week and her procedure is scheduled now for Friday 01-18-2014 at 11 am.  Dr. Elie Confer will go over the instructions for the Lovenox and coumadin.

## 2014-01-14 NOTE — Telephone Encounter (Signed)
I called and left message on Dr. Patrcia Dolly voice mail. Advising Dr. Carlean Purl would rather do this pt's colonoscopy with her being bridged with Lovenox and the coumadin held before the procedure. I also advised we changed this pt's colonsocopy date to Fri 01-18-2014. I asked him to please let me know if we can do this.

## 2014-01-14 NOTE — Patient Instructions (Signed)
Patient instructed to take medications as defined in the Anti-coagulation Track section of this encounter.  Patient instructed to OMIT today's dose and doses for the remainder of week until she has her colonoscopy performed on Friday April 24th at 1100h.  Patient verbalized understanding of these instructions.  Patient will commence Lovenox 120mg  to be administered subcutaneously---2 inches off of the "belly button", at the level of waistband (she is familiar with technique--has done this before). She will inject herself at [facility] and 5PM on Wednesday April 22nd, and at [facility] on Thursday April 23rd. This is LAST DOSE of Lovenox before the procedure on Friday April 24th at 1100h (30 hours OFF of Lovenox).

## 2014-01-14 NOTE — Progress Notes (Signed)
Anti-Coagulation Progress Note  Alicia Martinez is a 58 y.o. female who is currently on an anti-coagulation regimen.    RECENT RESULTS: Recent results are below, the most recent result is correlated with a dose of 37.5 mg. per week: Lab Results  Component Value Date   INR 2.40 01/14/2014   INR 2.60 12/31/2013   INR 4.10 11/19/2013    ANTI-COAG DOSE: Anticoagulation Dose Instructions as of 01/14/2014     [name] Tue Wed Thu Fri Sat   New Dose 5 mg Hold Hold Hold Hold Hold 5 mg       ANTICOAG SUMMARY: Anticoagulation Episode Summary   Current INR goal 2.0-3.0  Next INR check 01/28/2014  INR from last check 2.40 (01/14/2014)  Weekly max dose   Target end date Indefinite  [facility]  Preferred lab   Send INR reminders to    Indications  DEEP VENOUS THROMBOPHLEBITIS RECURRENT (Resolved) [453.40] Long term current use of anticoagulant [V58.61]        Comments         ANTICOAG TODAY: Anticoagulation Summary as of 01/14/2014   INR goal 2.0-3.0  Selected INR 2.40 (01/14/2014)  Next INR check 01/28/2014  Target end date Indefinite   Indications  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  Patient instructed to OMIT today's dose and doses for the remainder of week until she has her colonoscopy performed on Friday April 24th at 1100h.  Patient verbalized understanding of these instructions.  Patient will commence Lovenox 120mg  to be administered subcutaneously---2 inches off of the "belly button", at the level of waistband (she is familiar with technique--has done this before). She will inject herself at  [facility] and 5PM on Wednesday April 22nd, and at [facility] on Thursday April 23rd. This is LAST DOSE of Lovenox before the procedure on Friday April 24th at 1100h (30 hours OFF of Lovenox).       FOLLOW-UP Return in 2 weeks (on 01/28/2014) for Follow up INR at 4:30PM.  Jorene Guest, [name] Pharm.D., CACP

## 2014-01-15 ENCOUNTER — Encounter: Payer: BC Managed Care – PPO | Admitting: Internal Medicine

## 2014-01-15 NOTE — Telephone Encounter (Signed)
I spoke to the patient today and said Dr. Elie Confer told her about her Lovenox injections and the coumadin instrucitons.  She knows the procedure is for Friday 01-18-2014 at 11:00 am .  We went over the instructions on the phone. She thanked me for taking care of her anticoagulation situation with Dr. Elie Confer. I told her he was very helpful and called me back.

## 2014-01-15 NOTE — Progress Notes (Signed)
Indication: Recurrent venous thromboembolism. Duration: Lifelong. INR: At target. Agree with Dr. Groce's assessment and plan. 

## 2014-01-18 ENCOUNTER — Ambulatory Visit (AMBULATORY_SURGERY_CENTER): Payer: BC Managed Care – PPO | Admitting: Internal Medicine

## 2014-01-18 ENCOUNTER — Encounter: Payer: Self-pay | Admitting: Internal Medicine

## 2014-01-18 ENCOUNTER — Ambulatory Visit: Payer: BC Managed Care – PPO | Admitting: Family Medicine

## 2014-01-18 VITALS — BP 124/70 | HR 70 | Temp 98.2°F | Resp 16 | Ht 66.0 in | Wt 274.0 lb

## 2014-01-18 DIAGNOSIS — Z85048 Personal history of other malignant neoplasm of rectum, rectosigmoid junction, and anus: Secondary | ICD-10-CM

## 2014-01-18 DIAGNOSIS — D126 Benign neoplasm of colon, unspecified: Secondary | ICD-10-CM

## 2014-01-18 DIAGNOSIS — K5730 Diverticulosis of large intestine without perforation or abscess without bleeding: Secondary | ICD-10-CM

## 2014-01-18 MED ORDER — SODIUM CHLORIDE 0.9 % IV SOLN: 500.0000 mL | INTRAVENOUS | Status: DC

## 2014-01-18 NOTE — Progress Notes (Signed)
Called to room to assist during endoscopic procedure.  Patient ID and intended procedure confirmed with present staff. Received instructions for my participation in the procedure from the performing physician.  

## 2014-01-18 NOTE — Op Note (Addendum)
[place]  [address]. [place], [address]   COLONOSCOPY PROCEDURE REPORT  PATIENT: Alicia Martinez, Alicia Martinez  MR#: [id number] BIRTHDATE: 09/01/1956 , 42  yrs. old GENDER: Female ENDOSCOPIST: Gatha Mayer, MD, [facility] PROCEDURE DATE:  01/18/2014 PROCEDURE:   Colonoscopy with biopsy First Screening Colonoscopy - Avg.  risk and is 50 yrs.  old or older - No.  Prior Negative Screening - Now for repeat screening. N/A  History of Adenoma - Now for follow-up colonoscopy & has been > or = to 3 yrs.  Yes hx of adenoma.  Has been 3 or more years since last colonoscopy.  Polyps Removed Today? Yes. ASA CLASS:   Class [name] INDICATIONS:High risk patient with personal history of rectal cancer. MEDICATIONS: propofol (Diprivan) 350mg  IV, MAC sedation, administered by CRNA, and These medications were titrated to patient response per physician's verbal order DESCRIPTION OF PROCEDURE:   After the risks benefits and alternatives of the procedure were thoroughly explained, informed consent was obtained.  A digital rectal exam revealed no abnormalities of the rectum.   The LB [id number]  endoscope was introduced through the anus and advanced to the cecum, which was identified by both the appendix and ileocecal valve. No adverse events experienced.   The quality of the prep was Suprep good  The instrument was then slowly withdrawn as the colon was fully examined.  COLON FINDINGS: A medium sized polypoid shaped sessile polyp was found at the appendiceal orifice.  Multiple biopsies were performed using cold forceps.   A diminutive sessile polyp was found in the ascending colon.  A polypectomy was performed with cold forceps. The resection was complete and the polyp tissue was completely retrieved.   Severe diverticulosis was noted The finding was in the left colon.   Surgical scar in rectum.   The colon mucosa was otherwise normal.  Retroflexed views revealed no abnormalities.  The time to cecum=minutes 58 seconds.  Withdrawal time=12 minutes 54 seconds.  The scope was withdrawn and the procedure completed. COMPLICATIONS: There were no complications.  ENDOSCOPIC IMPRESSION: 1.   Medium sized sessile polyp was found at the appendiceal orifice; multiple biopsies were performed using cold forceps 2.   Diminutive sessile polyp was found in the ascending colon; polypectomy was performed with cold forceps 3.   Severe diverticulosis was noted in the left colon 4.   Surgical scar in rectum 5.   The colon mucosa was otherwise normal  RECOMMENDATIONS: 1.  Timing of repeat colonoscopy will be determined by pathology findings. 2.   Will need appendieal polyp removed by surgery if true polyp - had less prominent findings in 2012 - lymphoid tissue- await pathology and then will schedule appointment. 3.    resume warfarin today/tonight eSigned:  Gatha Mayer, MD, [facility] 01/18/2014 11:11 AM Revised: 01/18/2014 11:11 AM cc: Michael Boston, MD, Marylouise Stacks, MD and The Patient

## 2014-01-18 NOTE — Progress Notes (Signed)
Procedure ends, to recovery, report given and VSS. 

## 2014-01-18 NOTE — Progress Notes (Signed)
Pt had to wait on ride, son was coming to get her, placed pt in care room off recovery til ride arrived.-adm

## 2014-01-18 NOTE — Progress Notes (Signed)
Total of 3 Lovenox injections, last injection was 01/17/2014 at 0600.

## 2014-01-18 NOTE — Patient Instructions (Addendum)
You had 2 polyps on today's exam. I removed one tiny one but the other is inside of the appendix and Dr. Johney Maine will need to remove this if it is a real polyp. It does not look like cancer to my eye - await biopsies to be sure.  You also still have diverticulosis - common and not usually a problem. Please read the handout provided.  I will let you know pathology results and when to have another routine colonoscopy by mail.  I appreciate the opportunity to care for you. Gatha Mayer, MD, FACG  YOU HAD AN ENDOSCOPIC PROCEDURE TODAY AT [facility]: Refer to the procedure report that was given to you for any specific questions about what was found during the examination.  If the procedure report does not answer your questions, please call your gastroenterologist to clarify.  If you requested that your care partner not be given the details of your procedure findings, then the procedure report has been included in a sealed envelope for you to review at your convenience later.  YOU SHOULD EXPECT: Some feelings of bloating in the abdomen. Passage of more gas than usual.  Walking can help get rid of the air that was put into your GI tract during the procedure and reduce the bloating. If you had a lower endoscopy (such as a colonoscopy or flexible sigmoidoscopy) you may notice spotting of blood in your stool or on the toilet paper. If you underwent a bowel prep for your procedure, then you may not have a normal bowel movement for a few days.  DIET: Your first meal following the procedure should be a light meal and then it is ok to progress to your normal diet.  A half-sandwich or bowl of soup is an example of a good first meal.  Heavy or fried foods are harder to digest and may make you feel nauseous or bloated.  Likewise meals heavy in dairy and vegetables can cause extra gas to form and this can also increase the bloating.  Drink plenty of fluids but you should avoid alcoholic beverages for  24 hours.  ACTIVITY: Your care partner should take you home directly after the procedure.  You should plan to take it easy, moving slowly for the rest of the day.  You can resume normal activity the day after the procedure however you should NOT DRIVE or use heavy machinery for 24 hours (because of the sedation medicines used during the test).    SYMPTOMS TO REPORT IMMEDIATELY: A gastroenterologist can be reached at any hour.  During normal business hours, 8:30 AM to 5:00 PM Monday through Friday, call [phone].  After hours and on weekends, please call the GI answering service at [phone] who will take a message and have the physician on call contact you.   Following lower endoscopy (colonoscopy or flexible sigmoidoscopy):  Excessive amounts of blood in the stool  Significant tenderness or worsening of abdominal pains  Swelling of the abdomen that is new, acute  Fever of 100F or higher   FOLLOW UP: If any biopsies were taken you will be contacted by phone or by letter within the next 1-3 weeks.  Call your gastroenterologist if you have not heard about the biopsies in 3 weeks.  Our staff will call the home number listed on your records the next business day following your procedure to check on you and address any questions or concerns that you may have at that time regarding the  information given to you following your procedure. This is a courtesy call and so if there is no answer at the home number and we have not heard from you through the emergency physician on call, we will assume that you have returned to your regular daily activities without incident.  SIGNATURES/CONFIDENTIALITY: You and/or your care partner have signed paperwork which will be entered into your electronic medical record.  These signatures attest to the fact that that the information above on your After Visit Summary has been reviewed and is understood.  Full responsibility of the confidentiality of this  discharge information lies with you and/or your care-partner.  Polyp, diverticulosis-handouts given  Repeat colonoscopy will be determined by pathology.  Resume warfarin today 01/18/14.

## 2014-01-21 ENCOUNTER — Telehealth: Payer: Self-pay

## 2014-01-21 NOTE — Telephone Encounter (Signed)
Left a message on recorder at [phone] for the pt to call us back if any questions or concerns. Maw

## 2014-01-23 ENCOUNTER — Other Ambulatory Visit: Payer: Self-pay

## 2014-01-23 DIAGNOSIS — K635 Polyp of colon: Secondary | ICD-10-CM

## 2014-01-23 NOTE — Progress Notes (Signed)
Quick Note:  Polyps benign but does have a true polyp in appendix that I cannot remove - needs appt w/ Dr. Jonathon Jordan - no letter but place a 3 year colon recall ______

## 2014-01-25 ENCOUNTER — Encounter: Payer: Self-pay | Admitting: Family Medicine

## 2014-01-25 ENCOUNTER — Other Ambulatory Visit: Payer: BC Managed Care – PPO

## 2014-01-25 ENCOUNTER — Ambulatory Visit (INDEPENDENT_AMBULATORY_CARE_PROVIDER_SITE_OTHER): Payer: BC Managed Care – PPO | Admitting: Family Medicine

## 2014-01-25 VITALS — BP 144/86 | Ht 66.0 in | Wt 270.0 lb

## 2014-01-25 DIAGNOSIS — M25569 Pain in unspecified knee: Secondary | ICD-10-CM

## 2014-01-25 MED ORDER — METHYLPREDNISOLONE ACETATE 40 MG/ML IJ SUSP
40.0000 mg | Freq: Once | INTRAMUSCULAR | Status: AC
  Administered 2014-01-25: 40 mg via INTRA_ARTICULAR

## 2014-01-25 NOTE — Progress Notes (Signed)
   Subjective:    Patient ID: Alicia Martinez, female    DOB: [birth year], 58 y.o.   MRN: [id number]  HPI R knee pain, diffuse, 7/10, worsening over last 6 months. Pain with stairs. No specific injury Some stiffness and sensation of locking No warmth or erythema.  PERTINENT  PMH / PSH: Anticoagulation Hx rectal cancer Hx recurrent DVT smoker   Review of Systems See history of present illness above. Additional pertinent review of systems is negative for fever, sweats, chills, unusual weight change.    Objective:   Physical Exam GEN WD female, overweight. NAD KNEES B symmetrical with full extension and flexion. B crepitus. Small effusion Right, no erythema or warmth. Calf is soft. Medial joint line tenderness, small point area of tenderness on proximal pole patella. Ligaments intact to varus and valgus stress. Normal Lachman. Distally neurovascularly intact.  INJECTION: Patient was given informed consent, signed copy in the chart. Appropriate time out was taken. Area prepped and draped in usual sterile fashion. 1 cc of methylprednisolone 40 mg/ml plus  4 cc of 1% lidocaine without epinephrine was injected into the right knee using a(n) anterior medial approach.NOTE: significant synovial thickening noted on injection. Small, less than 1 cc total bleeding but careful attantion to hemostasis given anticoagulation.The patient tolerated the procedure well. There were no complications. Post procedure instructions were given.          Assessment & Plan:  Knee pain right CSI Get B standing  Films for review and rtc 1 m

## 2014-01-25 NOTE — Addendum Note (Signed)
Addended by: Arnette Schaumann C on: 01/25/2014 01:58 PM   Modules accepted: Orders

## 2014-01-28 ENCOUNTER — Ambulatory Visit (INDEPENDENT_AMBULATORY_CARE_PROVIDER_SITE_OTHER): Payer: BC Managed Care – PPO | Admitting: Pharmacist

## 2014-01-28 ENCOUNTER — Other Ambulatory Visit: Payer: Self-pay | Admitting: *Deleted

## 2014-01-28 DIAGNOSIS — Z7901 Long term (current) use of anticoagulants: Secondary | ICD-10-CM

## 2014-01-28 DIAGNOSIS — M25561 Pain in right knee: Secondary | ICD-10-CM

## 2014-01-28 LAB — POCT INR: INR: 2

## 2014-01-28 NOTE — Progress Notes (Signed)
Anti-Coagulation Progress Note  [name] is a 58 y.o. female who is currently on an anti-coagulation regimen.    RECENT RESULTS: Recent results are below, the most recent result is correlated with a dose of 35 mg. 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] Long term current use of anticoagulant [V58.61]        Comments         ANTICOAG TODAY: Anticoagulation Summary as of 01/28/2014   INR goal 2.0-3.0  Selected INR 2.00 (01/28/2014)  Next INR check 02/11/2014  Target end date Indefinite   Indications  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  Patient instructed to take today's dose.  Patient verbalized understanding of these instructions.       FOLLOW-UP Return in 2 weeks (on 02/11/2014) for Follow up INR at [place], [name] Pharm.D., CACP

## 2014-01-28 NOTE — Telephone Encounter (Signed)
I talked with pt and [facility] and given [facility]one injection.  She is to return to them in a month. She states this has not helped her pain and she wanted refill on Percocet. I talked with Dr Gordy Levan and she was not aware pt was started on pain meds. Pt informed she needs to make an appointment and talk with Dr Gordy Levan about pain med and sign a contract if needed.

## 2014-01-28 NOTE — Patient Instructions (Signed)
Patient instructed to take medications as defined in the Anti-coagulation Track section of this encounter.  Patient instructed to take today's dose.  Patient verbalized understanding of these instructions.    

## 2014-02-04 ENCOUNTER — Encounter: Payer: Self-pay | Admitting: Internal Medicine

## 2014-02-11 ENCOUNTER — Ambulatory Visit (INDEPENDENT_AMBULATORY_CARE_PROVIDER_SITE_OTHER): Payer: BC Managed Care – PPO | Admitting: Pharmacist

## 2014-02-11 ENCOUNTER — Encounter (INDEPENDENT_AMBULATORY_CARE_PROVIDER_SITE_OTHER): Payer: Self-pay | Admitting: Surgery

## 2014-02-11 ENCOUNTER — Ambulatory Visit (INDEPENDENT_AMBULATORY_CARE_PROVIDER_SITE_OTHER): Payer: BC Managed Care – PPO | Admitting: Surgery

## 2014-02-11 ENCOUNTER — Encounter (INDEPENDENT_AMBULATORY_CARE_PROVIDER_SITE_OTHER): Payer: Self-pay

## 2014-02-11 VITALS — BP 172/90 | HR 82 | Temp 97.6°F | Resp 16 | Ht 66.0 in | Wt 275.0 lb

## 2014-02-11 DIAGNOSIS — Z7901 Long term (current) use of anticoagulants: Secondary | ICD-10-CM

## 2014-02-11 DIAGNOSIS — D126 Benign neoplasm of colon, unspecified: Secondary | ICD-10-CM | POA: Insufficient documentation

## 2014-02-11 DIAGNOSIS — F17200 Nicotine dependence, unspecified, uncomplicated: Secondary | ICD-10-CM

## 2014-02-11 LAB — POCT INR: INR: 3.6

## 2014-02-11 MED ORDER — METRONIDAZOLE 500 MG PO TABS: 500.0000 mg | ORAL_TABLET | ORAL | Status: DC

## 2014-02-11 MED ORDER — NEOMYCIN SULFATE 500 MG PO TABS: 1000.0000 mg | ORAL_TABLET | ORAL | Status: DC

## 2014-02-11 NOTE — Patient Instructions (Signed)
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  With this can be an outpatient surgery.  You will require cardiac clearance.  Anticipate repeat Lovenox bridging around the time of surgery.  Quit smoking completely!  See the Handout(s) we have given you.  Please call our office at [phone] if you wish to schedule surgery or if you have further questions / concerns.   Colon Polyps Polyps are lumps of extra tissue growing inside the body. Polyps can grow in the large intestine (colon). Most colon polyps are noncancerous (benign). However, some colon polyps can become cancerous over time. Polyps that are larger than a pea may be harmful. To be safe, caregivers remove and test all polyps. CAUSES  Polyps form when mutations in the genes cause your cells to grow and divide even though no more tissue is needed. RISK FACTORS There are a number of risk factors that can increase your chances of getting colon polyps. They include:  Being older than 50 years.  Family history of colon polyps or colon cancer.  Long-term colon diseases, such as colitis or Crohn disease.  Being overweight.  Smoking.  Being inactive.  Drinking too much alcohol. SYMPTOMS  Most small polyps do not cause symptoms. If symptoms are present, they may include:  Blood in the stool. The stool may look dark red or black.  Constipation or diarrhea that lasts longer than 1 week. DIAGNOSIS People often do not know they have polyps until their caregiver finds them during a regular checkup. Your caregiver can use 4 tests to check for polyps:  Digital rectal exam. The caregiver wears gloves and feels inside the rectum. This test would find polyps only in the rectum.  Barium enema. The caregiver puts a liquid called barium into your rectum before taking  X-rays of your colon. Barium makes your colon look white. Polyps are dark, so they are easy to see in the X-ray pictures.  Sigmoidoscopy. A thin, flexible tube (sigmoidoscope) is placed into your rectum. The sigmoidoscope has a light and tiny camera in it. The caregiver uses the sigmoidoscope to look at the last third of your colon.  Colonoscopy. This test is like sigmoidoscopy, but the caregiver looks at the entire colon. This is the most common method for finding and removing polyps. TREATMENT  Any polyps will be removed during a sigmoidoscopy or colonoscopy. The polyps are then tested for cancer. PREVENTION  To help lower your risk of getting more colon polyps:  Eat plenty of fruits and vegetables. Avoid eating fatty foods.  Do not smoke.  Avoid drinking alcohol.  Exercise every day.  Lose weight if recommended by your caregiver.  Eat plenty of calcium and folate. Foods that are rich in calcium include milk, cheese, and broccoli. Foods that are rich in folate include chickpeas, kidney beans, and spinach. HOME CARE INSTRUCTIONS Keep all follow-up appointments as directed by your caregiver. You may need periodic exams to check for polyps. SEEK MEDICAL CARE IF: You notice bleeding during a bowel movement. Document Released: 06/09/2004 Document Revised: 12/06/2011 Document Reviewed: 11/23/2011 [facility] Patient Information 2014 [place].  LAPAROSCOPIC SURGERY: POST OP INSTRUCTIONS  1. DIET: Follow a light bland diet the first 24 hours after arrival home, such as soup, liquids, crackers, etc.  Be sure to include lots of fluids daily.  Avoid fast food or heavy  meals as your are more likely to get nauseated.  Eat a low fat the next few days after surgery.   2. Take your usually prescribed home medications unless otherwise directed. 3. PAIN CONTROL: a. Pain is best controlled by a usual combination of three different methods TOGETHER: i. Ice/Heat ii. Over the counter pain  medication iii. Prescription pain medication b. Most patients will experience some swelling and bruising around the incisions.  Ice packs or heating pads (30-60 minutes up to 6 times a day) will help. Use ice for the first few days to help decrease swelling and bruising, then switch to heat to help relax tight/sore spots and speed recovery.  Some people prefer to use ice alone, heat alone, alternating between ice & heat.  Experiment to what works for you.  Swelling and bruising can take several weeks to resolve.   c. It is helpful to take an over-the-counter pain medication regularly for the first few weeks.  Choose one of the following that works best for you: i. Naproxen (Aleve, etc)  Two 220mg  tabs twice a day ii. Ibuprofen (Advil, etc) Three 200mg  tabs four times a day (every meal & bedtime) iii. Acetaminophen (Tylenol, etc) 500-650mg  four times a day (every meal & bedtime) d. A  prescription for pain medication (such as oxycodone, hydrocodone, etc) should be given to you upon discharge.  Take your pain medication as prescribed.  i. If you are having problems/concerns with the prescription medicine (does not control pain, nausea, vomiting, rash, itching, etc), please call us [phone] to see if we need to switch you to a different pain medicine that will work better for you and/or control your side effect better. ii. If you need a refill on your pain medication, please contact your pharmacy.  They will contact our office to request authorization. Prescriptions will not be filled after 5 pm or on week-ends. 4. Avoid getting constipated.  Between the surgery and the pain medications, it is common to experience some constipation.  Increasing fluid intake and taking a fiber supplement (such as Metamucil, Citrucel, FiberCon, MiraLax, etc) 1-2 times a day regularly will usually help prevent this problem from occurring.  A mild laxative (prune juice, Milk of Magnesia, MiraLax, etc) should be taken  according to package directions if there are no bowel movements after 48 hours.   5. Watch out for diarrhea.  If you have many loose bowel movements, simplify your diet to bland foods & liquids for a few days.  Stop any stool softeners and decrease your fiber supplement.  Switching to mild anti-diarrheal medications (Kayopectate, Pepto Bismol) can help.  If this worsens or does not improve, please call us. 6. Wash / shower every day.  You may shower over the dressings as they are waterproof.  Continue to shower over incision(s) after the dressing is off. 7. Remove your waterproof bandages 5 days after surgery.  You may leave the incision open to air.  You may replace a dressing/Band-Aid to cover the incision for comfort if you wish.  8. ACTIVITIES as tolerated:   a. You may resume regular (light) daily activities beginning the next day-such as daily self-care, walking, climbing stairs-gradually increasing activities as tolerated.  If you can walk 30 minutes without difficulty, it is safe to try more intense activity such as jogging, treadmill, bicycling, low-impact aerobics, swimming, etc. b. Save the most intensive and strenuous activity for last such as sit-ups, heavy lifting, contact sports, etc  Refrain from any heavy lifting  or straining until you are off narcotics for pain control.   c. DO NOT PUSH THROUGH PAIN.  Let pain be your guide: If it hurts to do something, don't do it.  Pain is your body warning you to avoid that activity for another week until the pain goes down. d. You may drive when you are no longer taking prescription pain medication, you can comfortably wear a seatbelt, and you can safely maneuver your car and apply brakes. e. Dennis Bast may have sexual intercourse when it is comfortable.  9. FOLLOW UP in our office a. Please call [facility] at [phone] to set up an appointment to see your surgeon in the office for a follow-up appointment approximately 2-3 weeks after your surgery. b. Make  sure that you call for this appointment the day you arrive home to insure a convenient appointment time. 10. IF YOU HAVE DISABILITY OR FAMILY LEAVE FORMS, BRING THEM TO THE OFFICE FOR PROCESSING.  DO NOT GIVE THEM TO YOUR DOCTOR.   WHEN TO CALL us [phone]: 1. Poor pain control 2. Reactions / problems with new medications (rash/itching, nausea, etc)  3. Fever over 101.5 F (38.5 C) 4. Inability to urinate 5. Nausea and/or vomiting 6. Worsening swelling or bruising 7. Continued bleeding from incision. 8. Increased pain, redness, or drainage from the incision   The clinic staff is available to answer your questions during regular business hours (8:30am-5pm).  Please don't hesitate to call and ask to speak to one of our nurses for clinical concerns.   If you have a medical emergency, go to the nearest emergency room or call 911.  A surgeon from [facility] is always on call at the [facility], [place], [place], [place],   [address] ? MAIN: [phone] ? TOLL FREE: [phone] ?  FAX [phone] [email or web address]  STOP SMOKING!  We strongly recommend that you stop smoking.  Smoking increases the risk of surgery including infection in the form of an open wound, pus formation, abscess, hernia at an incision on the abdomen, etc.  You have an increased risk of other MAJOR complications such as stroke, heart attack, forming clots in the leg and/or lungs, and death.    Smoking Cessation Quitting smoking is important to your health and has many advantages. However, it is not always easy to quit since nicotine is a very addictive drug. Often times, people try 3 times or more before being able to quit. This document explains the best ways for you to prepare to quit smoking. Quitting takes hard work and a lot of effort, but you can do it. ADVANTAGES OF QUITTING SMOKING  You will live longer, feel better, and  live better.  Your body will feel the impact of quitting smoking almost immediately.  Within 20 minutes, blood pressure decreases. Your pulse returns to its normal level.  After 8 hours, carbon monoxide levels in the blood return to normal. Your oxygen level increases.  After 24 hours, the chance of having a heart attack starts to decrease. Your breath, hair, and body stop smelling like smoke.  After 48 hours, damaged nerve endings begin to recover. Your sense of taste and smell improve.  After 72 hours, the body is virtually free of nicotine. Your bronchial tubes relax and breathing becomes easier.  After 2 to 12 weeks, lungs can hold more air. Exercise becomes easier and circulation improves.  The risk of having a heart attack, stroke,  cancer, or lung disease is greatly reduced.  After 1 year, the risk of coronary heart disease is cut in half.  After 5 years, the risk of stroke falls to the same as a nonsmoker.  After 10 years, the risk of lung cancer is cut in half and the risk of other cancers decreases significantly.  After 15 years, the risk of coronary heart disease drops, usually to the level of a nonsmoker.  If you are pregnant, quitting smoking will improve your chances of having a healthy baby.  The people you live with, especially any children, will be healthier.  You will have extra money to spend on things other than cigarettes. QUESTIONS TO THINK ABOUT BEFORE ATTEMPTING TO QUIT You may want to talk about your answers with your caregiver.  Why do you want to quit?  If you tried to quit in the past, what helped and what did not?  What will be the most difficult situations for you after you quit? How will you plan to handle them?  Who can help you through the tough times? Your family? Friends? A caregiver?  What pleasures do you get from smoking? What ways can you still get pleasure if you quit? Here are some questions to ask your caregiver:  How can you help  me to be successful at quitting?  What medicine do you think would be best for me and how should I take it?  What should I do if I need more help?  What is smoking withdrawal like? How can I get information on withdrawal? GET READY  Set a quit date.  Change your environment by getting rid of all cigarettes, ashtrays, matches, and lighters in your home, car, or work. Do not let people smoke in your home.  Review your past attempts to quit. Think about what worked and what did not. GET SUPPORT AND ENCOURAGEMENT You have a better chance of being successful if you have help. You can get support in many ways.  Tell your family, friends, and co-workers that you are going to quit and need their support. Ask them not to smoke around you.  Get individual, group, or telephone counseling and support. Programs are available at [employer] and health centers. Call your local health department for information about programs in your area.  Spiritual beliefs and practices may help some smokers quit.  Download a "quit meter" on your computer to keep track of quit statistics, such as how long you have gone without smoking, cigarettes not smoked, and money saved.  Get a self-help book about quitting smoking and staying off of tobacco. [place] yourself from urges to smoke. Talk to someone, go for a walk, or occupy your time with a task.  Change your normal routine. Take a different route to work. Drink tea instead of coffee. Eat breakfast in a different place.  Reduce your stress. Take a hot bath, exercise, or read a book.  Plan something enjoyable to do every day. Reward yourself for not smoking.  Explore interactive web-based programs that specialize in helping you quit. GET MEDICINE AND USE IT CORRECTLY Medicines can help you stop smoking and decrease the urge to smoke. Combining medicine with the above behavioral methods and support can greatly increase your  chances of successfully quitting smoking.  Nicotine replacement therapy helps deliver nicotine to your body without the negative effects and risks of smoking. Nicotine replacement therapy includes nicotine gum, lozenges, inhalers, nasal sprays, and skin  patches. Some may be available over-the-counter and others require a prescription.  Antidepressant medicine helps people abstain from smoking, but how this works is unknown. This medicine is available by prescription.  Nicotinic receptor partial agonist medicine simulates the effect of nicotine in your brain. This medicine is available by prescription. Ask your caregiver for advice about which medicines to use and how to use them based on your health history. Your caregiver will tell you what side effects to look out for if you choose to be on a medicine or therapy. Carefully read the information on the package. Do not use any other product containing nicotine while using a nicotine replacement product.  RELAPSE OR DIFFICULT SITUATIONS Most relapses occur within the first 3 months after quitting. Do not be discouraged if you start smoking again. Remember, most people try several times before finally quitting. You may have symptoms of withdrawal because your body is used to nicotine. You may crave cigarettes, be irritable, feel very hungry, cough often, get headaches, or have difficulty concentrating. The withdrawal symptoms are only temporary. They are strongest when you first quit, but they will go away within 10 14 days. To reduce the chances of relapse, try to:  Avoid drinking alcohol. Drinking lowers your chances of successfully quitting.  Reduce the amount of caffeine you consume. Once you quit smoking, the amount of caffeine in your body increases and can give you symptoms, such as a rapid heartbeat, sweating, and anxiety.  Avoid smokers because they can make you want to smoke.  Do not let weight gain distract you. Many smokers will gain  weight when they quit, usually less than 10 pounds. Eat a healthy diet and stay active. You can always lose the weight gained after you quit.  Find ways to improve your mood other than smoking. FOR MORE INFORMATION  www.smokefree.gov    While it can be one of the most difficult things to do, the [place] community has programs to help you stop.  Consider talking with your primary care physician about options.  Also, Smoking Cessation classes are available through the [facility]:  The smoking cessation program is a proven-effective program from the American Lung Association. The program is available for anyone 63 and older who currently smokes. The program lasts for 7 weeks and is 8 sessions. Each class will be approximately 1 1/2 hours. The program is every Tuesday.  All classes are 12-1:30pm and same location.  Event Location Information:  Location: [facility] 2nd Floor Conference Room 2-037; located next to [facility] cross streets: [place] Entrance into the [facility] is adjacent to the [employer] main entrance. The conference room is located on the 2nd floor.  Parking Instructions: Visitor parking is adjacent to [employer] main entrance and the [place]    A smoking cessation program is also offered through the [facility]. Register online at ClickDebate.gl or call [phone] for more information.   Tobacco cessation counseling is available at [facility]. Call [phone] for a free appointment.   Tobacco cessation classes also are available through the [place] in [place]. For information, call [phone].   The Patient Education Network features videos on tobacco cessation. Please consult your listings in the center of this book to find instructions on how to access this resource.   If you want more information, ask  your nurse.

## 2014-02-11 NOTE — Progress Notes (Signed)
INTERNAL MEDICINE TEACHING ATTENDING ADDENDUM - Torien Ramroop M.D  Duration- indefinite, Indication- recurrent DVT, INR- supratherapeutic. Agree with Dr. Groce's recommendations as outlined in his note.      

## 2014-02-11 NOTE — Patient Instructions (Addendum)
Patient instructed to take medications as defined in the Anti-coagulation Track section of this encounter.  Patient instructed to decrease today's (Mondays) dose from 1&1/2 tablets to 1 tablet. This will yield a decrease in total weekly dose by approximately 7%.  Discussed with patient the considerations around anticoagulant therapy management for future appendectomy. Will follow up with patient regarding any therapy changes in preparation for the procedure. Patient verbalized understanding of these instructions.

## 2014-02-11 NOTE — Progress Notes (Signed)
Anti-Coagulation Progress Note  Alicia Martinez is a 58 y.o. female who is currently on an anti-coagulation regimen.    RECENT RESULTS: Recent results are below, the most recent result is correlated with a dose of 35 mg. per week: Lab Results  Component Value Date   INR 3.6 02/11/2014   INR 2.00 01/28/2014   INR 2.40 01/14/2014    ANTI-COAG DOSE: Anticoagulation Dose Instructions as of 02/11/2014     [name] Tue Wed Thu Fri Sat   New Dose 5 mg 5 mg 5 mg 5 mg 5 mg 5 mg 5 mg       ANTICOAG SUMMARY: Anticoagulation Episode Summary   Current INR goal 2.0-3.0  Next INR check 02/25/2014  INR from last check 3.6! 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  Patient instructed to decrease today's (Mondays) dose from 1&1/2 tablets to 1 tablet. This will yield a decrease in total weekly dose by approximately 7%.  Discussed with patient the considerations around anticoagulant therapy management for future appendectomy. Will follow up with patient regarding any therapy changes in preparation for the procedure. Patient verbalized understanding of these instructions.      FOLLOW-UP Return in about 2 weeks (around 02/25/2014) for Follow up INR at 1115.  Jorene Guest, [name] Pharm.D., CACP

## 2014-02-11 NOTE — Progress Notes (Addendum)
Subjective:     Patient ID: Alicia Martinez, female   DOB: 05-13-1956, 58 y.o.   MRN: [id number]  HPI  Note: This dictation was prepared with Dragon/digital dictation along with [facility] technology. Any transcriptional errors that result from this process are unintentional.       LASHE OLIVEIRA  14-Feb-1956 [id number]  Patient Care Team: Jones Bales, MD as PCP - General (Internal Medicine) Milta Deiters, MD as Referring Physician (Internal Medicine) Fay Records, MD as Consulting Physician (Cardiology) Gatha Mayer, MD as Consulting Physician (Gastroenterology) Adin Hector, MD as Consulting Physician (General Surgery)  This patient is a 58 y.o.female who presents today for surgical evaluation at the request of Dr. Carlean Purl.   Reason for visit: Sessile serrated adenoma of appendiceal orifice  Pleasant morbidly obese smoking female.  I had performed a laparoscopic low anterior resection for a T1 N0 cancer of the rectum in 2010.  She had endoscopy by Dr Carlean Purl in 2012.  There was concern of some abnormal tissue at the appendiceal orifice.  Biopsy showed some lymphoid aggregate.  Followup colonoscopy now 3 years later shows evidence of sessile serrated adenoma at appendiceal orifice.  Patient sent to me for consideration of removal of the adenoma by appendectomy since cannot be removed endoscopically.  She had struggled with some mild intermittent abdominal pains but that is much improved.  She had issues with constipation but it is improved.  She continues to work.  She comes today with her husband.  She is down to a few cigarettes a day, smoking less.  She can walk a half hour without much difficulty.  Having daily bowel movements.  No skin infections.  No operations since the low anterior resection in 2010.  Patient Active Problem List   Diagnosis Date Noted  . Serrated adenoma of appendiceal orifice 02/11/2014  . DVT, lower extremity, recurrent 01/08/2014  . Other  pulmonary embolism and infarction 01/08/2014  . Right knee pain 12/31/2013  . Palpitations 05/08/2013  . Preventative health care 10/31/2012  . Sleep apnea 08/09/2012  . Adnexal mass 01/18/2011  . Long term current use of anticoagulant 10/17/2010  . BACK PAIN 12/01/2009  . ADENOCARCINOMA, RECTUM 08/06/2009  . PERS HX MAL NEOPLSM RECT RECTOSIGMOID JUNC&ANUS 08/06/2009  . Obesity, Class [name], BMI 40-49.9 (morbid obesity) 02/27/2009  . TOBACCO ABUSE 02/27/2009  . HYPERTENSION 02/27/2009    Past Medical History  Diagnosis Date  . Deep vein thrombosis     left lower extremity  . Pulmonary embolism   . Allergy   . Anemia   . Hypertension   . History of rectal cancer     T1N0 resected 08/2009  . Diverticulitis large intestine   . Obesity   . Infectious colitis   . Sleep apnea 08/09/2012    Sleep study 02/2013 : Severe obstructive sleep apnea/hypopnea syndrome, AHI 88.7, desat to 84%. CPAP titration to 15 CWP, AHI 0 per hour. Medium ResMed Mirage Quattro full-face mask with heated humidifier.        Past Surgical History  Procedure Laterality Date  . Abdominal hysterectomy    . Tubal ligation    . Transanal rectal resection  08/2009    T1N0 rectal cancer Dr. Johney Maine  . Colonoscopy      History   Social History  . Marital Status: Single    Spouse Name: N/A    Number of Children: N/A  . Years of Education: N/A   Occupational History  .  [employer]   Social History Main Topics  . Smoking status: Current Some Day Smoker -- 0.25 packs/day for 40 years    Types: Cigarettes  . Smokeless tobacco: Never Used  . Alcohol Use: No  . Drug Use: No  . Sexual Activity: Not on file   Other Topics Concern  . Not on file   Social History Narrative   Patient does not regular exercise. He has three boys. Daily caffeine use 2/day.    Family History  Problem Relation Age of Onset  . Cancer Mother     unknown type but patient thinks it was bone marrow cancer  . Heart disease  Sister   . Heart disease Brother   . Clotting disorder Brother   . Colon cancer Neg Hx   . Stomach cancer Neg Hx     Current Outpatient Prescriptions  Medication Sig Dispense Refill  . acetaminophen (TYLENOL) 500 MG tablet Take 1,000 mg by mouth every 6 (six) hours as needed. pain      . aspirin EC 81 MG tablet Take 1 tablet (81 mg total) by mouth daily.  90 tablet  3  . diphenhydrAMINE (BENADRYL) 50 MG tablet Take 0.5 tablets (25 mg total) by mouth every 8 (eight) hours as needed for itching.  30 tablet  0  . hydrochlorothiazide (HYDRODIURIL) 25 MG tablet Take 0.5 tablets (12.5 mg total) by mouth daily. For blood pressure  30 tablet  11  . oxyCODONE-acetaminophen (PERCOCET/ROXICET) 5-325 MG per tablet Take 1 tablet by mouth every 8 (eight) hours as needed for severe pain.  90 tablet  0  . warfarin (COUMADIN) 5 MG tablet Take as directed by Dr Elie Confer  45 tablet  2   No current facility-administered medications for this visit.     Allergies  Allergen Reactions  . Aspirin     REACTION: can not take due to coumadin    BP 172/90  Pulse 82  Temp(Src) 97.6 F (36.4 C)  Resp 16  Ht 5\' 6"  (1.676 m)  Wt 275 lb (124.739 kg)  BMI 44.41 kg/m2  No results found.   Review of Systems  Constitutional: Negative for fever, chills, diaphoresis, appetite change and fatigue.  HENT: Negative for ear discharge, ear pain, sore throat and trouble swallowing.   Eyes: Negative for photophobia, discharge and visual disturbance.  Respiratory: Negative for cough, choking, chest tightness and shortness of breath.   Cardiovascular: Positive for leg swelling. Negative for chest pain and palpitations.  Gastrointestinal: Negative for nausea, vomiting, abdominal pain, diarrhea, constipation, anal bleeding and rectal pain.  Endocrine: Negative for cold intolerance and heat intolerance.  Genitourinary: Negative for dysuria, frequency and difficulty urinating.  Musculoskeletal: Positive for arthralgias.  Negative for gait problem, myalgias and neck pain.  Skin: Negative for color change, pallor and rash.  Allergic/Immunologic: Negative for environmental allergies, food allergies and immunocompromised state.  Neurological: Negative for dizziness, speech difficulty, weakness and numbness.  Hematological: Negative for adenopathy.  Psychiatric/Behavioral: Negative for confusion and agitation. The patient is not nervous/anxious.        Objective:   Physical Exam  Constitutional: She is oriented to person, place, and time. She appears well-developed and well-nourished. No distress.  HENT:  Head: Normocephalic.    Mouth/Throat: Oropharynx is clear and moist. No oropharyngeal exudate.  Eyes: Conjunctivae and EOM are normal. Pupils are equal, round, and reactive to light. No scleral icterus.  Neck: Normal range of motion. Neck supple. No tracheal deviation present.  Cardiovascular: Normal rate, regular  rhythm and intact distal pulses.   Pulmonary/Chest: Effort normal and breath sounds normal. No stridor. No respiratory distress. She exhibits no tenderness.  Abdominal: Soft. She exhibits no distension and no mass. There is no tenderness. There is no rigidity, no rebound, no guarding, no tenderness at McBurney's point and negative Murphy's sign. No hernia. Hernia confirmed negative in the ventral area, confirmed negative in the right inguinal area and confirmed negative in the left inguinal area.    Genitourinary: No vaginal discharge found.  Musculoskeletal: Normal range of motion. She exhibits no tenderness.       Right elbow: She exhibits normal range of motion.       Left elbow: She exhibits normal range of motion.       Right wrist: She exhibits normal range of motion.       Left wrist: She exhibits normal range of motion.       Right hand: Normal strength noted.       Left hand: Normal strength noted.  Lymphadenopathy:       Head (right side): No posterior auricular adenopathy present.        Head (left side): No posterior auricular adenopathy present.    She has no cervical adenopathy.    She has no axillary adenopathy.       Right: No inguinal adenopathy present.       Left: No inguinal adenopathy present.  Neurological: She is alert and oriented to person, place, and time. No cranial nerve deficit. She exhibits normal muscle tone. Coordination normal.  Skin: Skin is warm and dry. No rash noted. She is not diaphoretic. No erythema.  Psychiatric: She has a normal mood and affect. Her behavior is normal. Judgment and thought content normal.       Assessment:     Tissue at appendiceal orifice.  Biopsy consistent with sessile serrated adenoma.  No evidence of recurrence of stage I rectal cancer s/p LAR 2010.     Plan:     Because of adenomatous changes at the appendiceal orifice, I think she would benefit from removal.  I think it is reasonable to do laparoscopic appendectomy with resection of the cecal wall.  This looked too large on endoscopic view.  Try to avoid ileocecectomy possible.  I discussed with the patient and her husband who is also patient of mine.  They agree:  The anatomy & physiology of the digestive tract was discussed.  The pathophysiology of adenoomas was discussed.  Natural history risks without surgery was discussed.   I feel the risks of no intervention will lead to serious problems that outweigh the operative risks; therefore, I recommended diagnostic laparoscopy with removal of appendix & part of cecal wall to remove the pathology.  Laparoscopic & open techniques were discussed.   I noted a good likelihood this will help address the problem.    Risks such as bleeding, infection, abscess, leak, reoperation, possible ostomy, hernia, heart attack, death, and other risks were discussed.  Goals of post-operative recovery were discussed as well.  We will work to minimize complications.  Questions were answered.  The patient & husband express understanding &  wish to proceed with surgery.   I recommended obtaining preoperative cardiac clearance.  I am concerned about the health of the patient and the ability to tolerate the operation.  Therefore, we will request clearance by cardiology to better assess operative risk & see if a reevaluation, further workup, adjustment to medications, etc is needed.  She recalls  needing Lovenox bridge even for the colonoscopy.  I will defer to her cardiologist and pharmacist.  STOP SMOKING! We talked to the patient about the dangers of smoking.  We stressed that tobacco use dramatically increases the risk of peri-operative complications such as infection, tissue necrosis leaving to problems with incision/wound and organ healing, hernia, chronic pain, heart attack, stroke, DVT, pulmonary embolism, and death.  We noted there are programs in our community to help stop smoking.  Information was available.

## 2014-02-13 ENCOUNTER — Telehealth (INDEPENDENT_AMBULATORY_CARE_PROVIDER_SITE_OTHER): Payer: Self-pay

## 2014-02-13 NOTE — Telephone Encounter (Signed)
LMOM for pt to call me. I want to let her know that I have turned her surgical orders into scheduling today. I want to go over the pt needing to get a Lovenox bridge by Dr Harrington Challenger along with Jacquelyne Balint. I would like to speak with pt when she returns my call.

## 2014-02-13 NOTE — Telephone Encounter (Signed)
Pt returned my call. I advised her of the orders being turned into scheduling today. I advised her that she will need a Lovenox bridging done for surgery. I asked for the pt to be in touch with Jacquelyne Balint w/the Coumadin clinic once she gets her surgical date so he can help with the pt stopping the [facility] along with doing the Lovenox bridge. I asked for the pt to call me once she has spoken to [place] just to make sure we have everything we need in place for surgery. The pt understands.

## 2014-02-14 ENCOUNTER — Encounter (INDEPENDENT_AMBULATORY_CARE_PROVIDER_SITE_OTHER): Payer: Self-pay

## 2014-02-22 ENCOUNTER — Ambulatory Visit (INDEPENDENT_AMBULATORY_CARE_PROVIDER_SITE_OTHER): Payer: BC Managed Care – PPO | Admitting: Family Medicine

## 2014-02-22 ENCOUNTER — Encounter: Payer: Self-pay | Admitting: Family Medicine

## 2014-02-22 VITALS — BP 142/83 | Ht 66.0 in | Wt 270.0 lb

## 2014-02-22 DIAGNOSIS — M25569 Pain in unspecified knee: Secondary | ICD-10-CM

## 2014-02-22 DIAGNOSIS — M75100 Unspecified rotator cuff tear or rupture of unspecified shoulder, not specified as traumatic: Secondary | ICD-10-CM | POA: Insufficient documentation

## 2014-02-22 DIAGNOSIS — M25561 Pain in right knee: Secondary | ICD-10-CM

## 2014-02-22 DIAGNOSIS — M719 Bursopathy, unspecified: Secondary | ICD-10-CM

## 2014-02-22 DIAGNOSIS — M67919 Unspecified disorder of synovium and tendon, unspecified shoulder: Secondary | ICD-10-CM

## 2014-02-22 NOTE — Assessment & Plan Note (Signed)
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

## 2014-02-22 NOTE — Progress Notes (Signed)
Patient ID: [name], female   DOB: [birth year], 58 y.o.   MRN: [id number]    [facility] [address] [place], [place] [address] Phone: [phone] Fax: [phone]   Patient Name: Alicia Martinez Date of Birth: 10/26/1955 Medical Record Number: [id number] Gender: female Date of Encounter: 02/22/2014  History of Present Illness:  Alicia Martinez is a 58 y.o. very pleasant female patient who presents with the following: Follow up of R knee pain and new L shoulder pain   R knee pain - wasn't really helped by the injection but states that the pain has eased up some and that he locking and popping symptoms have improved.   L shoulder. She is R handed and states that her L shoulder began hurting about 1 week ago. States that it is mostly pain in her deltoid worsened by any movement. She denies overt injury, fever, and erythema.   She is having surgery in a few weeks.   Patient Active Problem List   Diagnosis Date Noted  . Serrated adenoma of appendiceal orifice 02/11/2014  . DVT, lower extremity, recurrent 01/08/2014  . Other pulmonary embolism and infarction 01/08/2014  . Right knee pain 12/31/2013  . Palpitations 05/08/2013  . Preventative health care 10/31/2012  . Sleep apnea 08/09/2012  . Adnexal mass 01/18/2011  . Long term current use of anticoagulant 10/17/2010  . BACK PAIN 12/01/2009  . ADENOCARCINOMA, RECTUM 08/06/2009  . PERS HX MAL NEOPLSM RECT RECTOSIGMOID JUNC&ANUS 08/06/2009  . Obesity, Class [name], BMI 40-49.9 (morbid obesity) 02/27/2009  . TOBACCO ABUSE 02/27/2009  . HYPERTENSION 02/27/2009   Past Medical History  Diagnosis Date  . Deep vein thrombosis     left lower extremity  . Pulmonary embolism   . Allergy   . Anemia   . Hypertension   . History of rectal cancer     T1N0 resected 08/2009  . Diverticulitis large intestine   . Obesity   . Infectious colitis   . Sleep apnea 08/09/2012    Sleep study  02/2013 : Severe obstructive sleep apnea/hypopnea syndrome, AHI 88.7, desat to 84%. CPAP titration to 15 CWP, AHI 0 per hour. Medium ResMed Mirage Quattro full-face mask with heated humidifier.       Past Surgical History  Procedure Laterality Date  . Abdominal hysterectomy    . Tubal ligation    . Transanal rectal resection  08/2009    T1N0 rectal cancer Dr. Johney Maine  . Colonoscopy     History  Substance Use Topics  . Smoking status: Current Some Day Smoker -- 0.25 packs/day for 40 years    Types: Cigarettes  . Smokeless tobacco: Never Used  . Alcohol Use: No   Family History  Problem Relation Age of Onset  . Cancer Mother     unknown type but patient thinks it was bone marrow cancer  . Heart disease Sister   . Heart disease Brother   . Clotting disorder Brother   . Colon cancer Neg Hx   . Stomach cancer Neg Hx    Allergies  Allergen Reactions  . Aspirin     REACTION: can not take due to coumadin    Medication list has been reviewed and updated.  Prior to Admission medications   Medication Sig Start Date End Date Taking? Authorizing Provider  acetaminophen (TYLENOL) 500 MG tablet Take 1,000 mg by mouth every 6 (six) hours as needed. pain    Historical Provider, MD  aspirin EC 81 MG  tablet Take 1 tablet (81 mg total) by mouth daily. 09/14/13   Fay Records, MD  diphenhydrAMINE (BENADRYL) 50 MG tablet Take 0.5 tablets (25 mg total) by mouth every 8 (eight) hours as needed for itching. 10/31/12   Pedro Earls, MD  hydrochlorothiazide (HYDRODIURIL) 25 MG tablet Take 0.5 tablets (12.5 mg total) by mouth daily. For blood pressure 11/09/13   Jones Bales, MD  metroNIDAZOLE (FLAGYL) 500 MG tablet Take 1 tablet (500 mg total) by mouth as directed. Take 2 pills (=1000mg ) by mouth at 1pm, 3pm, and 10pm the day before your colorectal operation as discussed in [place] office.  Call [phone] with questions 02/11/14   Adin Hector, MD  neomycin (MYCIFRADIN) 500 MG tablet Take 2 tablets  (1,000 mg total) by mouth as directed. Take 2 pills (=1000mg ) by mouth at 1pm, 3pm, and 10pm the day before your colorectal operation as discussed in [place] office.  Call [phone] with questions 02/11/14   Adin Hector, MD  oxyCODONE-acetaminophen (PERCOCET/ROXICET) 5-325 MG per tablet Take 1 tablet by mouth every 8 (eight) hours as needed for severe pain. 12/31/13   Cresenciano Genre, MD  warfarin (COUMADIN) 5 MG tablet Take as directed by Dr Elie Confer 12/12/13   Bartholomew Crews, MD    Review of Systems:  Per HPI  Physical Examination: Filed Vitals:   02/22/14 1151  BP: 142/83   Filed Vitals:   02/22/14 1147  Height: 5\' 6"  (1.676 m)  Weight: 270 lb (122.471 kg)   Body mass index is 43.6 kg/(m^2).  Gen: NAD, alert, cooperative with exam HEENT: NCAT Neuro: Alert and oriented, No gross deficits MSK: L shoulder ROM limited by pain, Full passive ROM, Note that she cried during the exam but did not complain or resist the exam.  Tenderness over the deltoid, no other tenderness to palp Pain with hawkins and empty can, also pain wth internal rotation No pain with external rotation.    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

## 2014-02-25 ENCOUNTER — Ambulatory Visit (INDEPENDENT_AMBULATORY_CARE_PROVIDER_SITE_OTHER): Payer: BC Managed Care – PPO | Admitting: Pharmacist

## 2014-02-25 DIAGNOSIS — Z7901 Long term (current) use of anticoagulants: Secondary | ICD-10-CM

## 2014-02-25 LAB — POCT INR: INR: 3

## 2014-02-25 NOTE — Progress Notes (Addendum)
Anti-Coagulation Progress Note  Alicia Martinez is a 58 y.o. female who reports to the clinic for monitoring of anticoagulation treatment.    RECENT RESULTS: Recent results are below, the most recent result is correlated with a dose of 37.5 mg. per week (no changes): Lab Results  Component Value Date   INR 3.0 02/25/2014   INR 3.6 02/11/2014   INR 2.00 01/28/2014   ANTI-COAG DOSE: Anticoagulation Dose Instructions as of 02/25/2014     [name] Tue Wed Thu Fri Sat   New Dose 5 mg 7.5 mg 5 mg 5 mg 5 mg 5 mg 5 mg       ANTICOAG SUMMARY: Anticoagulation Episode Summary   Current INR goal 2.0-3.0  Next INR check 03/25/2014  INR from last check 3.0 (02/25/2014)  Weekly max dose   Target end date Indefinite  INR check location [facility]  Preferred lab   Send INR reminders to    Indications  DEEP VENOUS THROMBOPHLEBITIS RECURRENT (Resolved) [453.40] Long term current use of anticoagulant [V58.61]        Comments         ANTICOAG TODAY: Anticoagulation Summary as of 02/25/2014   INR goal 2.0-3.0  Selected INR 3.0 (02/25/2014)  Next INR check 03/25/2014  Target end date Indefinite   Indications  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  Patient instructed to take today's dose.  Patient verbalized understanding of these instructions.  Patient will be contacted later this week with instructions for bridging with enoxaparin in anticipation of upcoming procedure planned for 03/15/14.   FOLLOW-UP Return in about 4 weeks (around 03/25/2014) for Patient to contact clinic to schedule follow up after procedure.  Flossie Dibble,  PharmD BCPS, BCACP

## 2014-02-25 NOTE — Patient Instructions (Signed)
Patient instructed to take medications as defined in the Anti-coagulation Track section of this encounter.  Patient instructed to take today's dose.  Patient verbalized understanding of these instructions.    

## 2014-02-25 NOTE — Progress Notes (Signed)
Patient ID: Alicia Martinez, female   DOB: 03-Sep-1956, 58 y.o.   MRN: [id number] [place] Attending Note: I have seen and examined this patient. I have discussed this patient with the resident and reviewed the assessment and plan as documented above. I agree with the resident's findings and plan.

## 2014-02-27 NOTE — Telephone Encounter (Signed)
Pt saw Coumadin clinic on 02/25/14 and they are going to manage the Lovenox bridge for the pt's surgery scheduled on 03/15/14. The pt is to get instructions later this week from the coumadin clinic.

## 2014-03-07 ENCOUNTER — Encounter (HOSPITAL_COMMUNITY): Payer: Self-pay | Admitting: Pharmacy Technician

## 2014-03-08 ENCOUNTER — Encounter (HOSPITAL_COMMUNITY)
Admission: RE | Admit: 2014-03-08 | Discharge: 2014-03-08 | Disposition: A | Discharge status: Home / Self Care | Payer: BC Managed Care – PPO | Source: Ambulatory Visit | Attending: Surgery | Admitting: Surgery

## 2014-03-08 ENCOUNTER — Encounter (HOSPITAL_COMMUNITY): Payer: Self-pay

## 2014-03-08 ENCOUNTER — Ambulatory Visit (HOSPITAL_COMMUNITY)
Admission: RE | Admit: 2014-03-08 | Discharge: 2014-03-08 | Disposition: A | Discharge status: Home / Self Care | Payer: BC Managed Care – PPO | Source: Ambulatory Visit | Attending: Surgery | Admitting: Surgery

## 2014-03-08 DIAGNOSIS — F17200 Nicotine dependence, unspecified, uncomplicated: Secondary | ICD-10-CM | POA: Insufficient documentation

## 2014-03-08 DIAGNOSIS — I10 Essential (primary) hypertension: Secondary | ICD-10-CM | POA: Insufficient documentation

## 2014-03-08 DIAGNOSIS — Z01818 Encounter for other preprocedural examination: Secondary | ICD-10-CM | POA: Insufficient documentation

## 2014-03-08 DIAGNOSIS — Z86711 Personal history of pulmonary embolism: Secondary | ICD-10-CM | POA: Insufficient documentation

## 2014-03-08 DIAGNOSIS — Z01812 Encounter for preprocedural laboratory examination: Secondary | ICD-10-CM | POA: Insufficient documentation

## 2014-03-08 HISTORY — DX: Occlusion and stenosis of unspecified carotid artery: I65.29

## 2014-03-08 HISTORY — DX: Palpitations: R00.2

## 2014-03-08 HISTORY — DX: Unspecified osteoarthritis, unspecified site: M19.90

## 2014-03-08 HISTORY — DX: Pain, unspecified: R52

## 2014-03-08 LAB — BASIC METABOLIC PANEL
BUN: 11 mg/dL (ref 6–23)
CO2: 30 mEq/L (ref 19–32)
Calcium: 9.7 mg/dL (ref 8.4–10.5)
Chloride: 102 mEq/L (ref 96–112)
Creatinine, Ser: 0.63 mg/dL (ref 0.50–1.10)
GFR calc Af Amer: >90 mL/min (ref >90)
Glucose, Bld: 114 mg/dL — ABNORMAL HIGH (ref 70–99)
Potassium: 4 mEq/L (ref 3.7–5.3)
Sodium: 141 mEq/L (ref 137–147)

## 2014-03-08 LAB — CBC
HEMATOCRIT: 36.5 % (ref 36.0–46.0)
Hemoglobin: 12.4 g/dL (ref 12.0–15.0)
MCH: 30.6 pg (ref 26.0–34.0)
MCHC: 34 g/dL (ref 30.0–36.0)
MCV: 90.1 fL (ref 78.0–100.0)
Platelets: 245 10*3/uL (ref 150–400)
RBC: 4.05 MIL/uL (ref 3.87–5.11)
RDW: 13.4 % (ref 11.5–15.5)
WBC: 5.4 10*3/uL (ref 4.0–10.5)

## 2014-03-08 LAB — PROTIME-INR
INR: 2.21 — AB (ref 0.00–1.49)
Prothrombin Time: 23.8 seconds — ABNORMAL HIGH (ref 11.6–15.2)

## 2014-03-08 LAB — SURGICAL PCR SCREEN
MRSA, PCR: POSITIVE — AB
Staphylococcus aureus: POSITIVE — AB

## 2014-03-08 LAB — APTT: aPTT: 33 seconds (ref 24–37)

## 2014-03-08 MED ORDER — CHLORHEXIDINE GLUCONATE 4 % EX LIQD: 1.0000 "application " | Freq: Once | CUTANEOUS | Status: DC

## 2014-03-08 NOTE — Pre-Procedure Instructions (Signed)
EKG REPORT 05-08-13 IN [facility] CARDIOLOGIST OFFICE NOTES IN [facility] 08-24-13 DR. PAULA ROSS. 2-D ECHO REPORT 05/10/13 IN [facility]. CAROTID DOPPLER REPORT IN [facility]. CXR WAS DONE TODAY - PREOP AT [facility].

## 2014-03-08 NOTE — Patient Instructions (Addendum)
FOLLOW YOUR PREP INSTRUCTIONS FROM YOUR DOCTORS  YOUR SURGERY IS SCHEDULED AT [facility]  ON:  Friday  6/19  REPORT TO  SHORT STAY CENTER AT:  5:30 AM   PLEASE COME IN THE [place] ENTRANCE AND FOLLOW SIGNS TO SHORT STAY CENTER.  DO NOT EAT OR DRINK ANYTHING AFTER MIDNIGHT THE NIGHT BEFORE YOUR SURGERY.  YOU MAY BRUSH YOUR TEETH, RINSE OUT YOUR MOUTH--BUT NO WATER, NO FOOD, NO CHEWING GUM, NO MINTS, NO CANDIES, NO CHEWING TOBACCO.  PLEASE TAKE THE FOLLOWING MEDICATIONS THE AM OF YOUR SURGERY WITH A FEW SIPS OF WATER:    NO MEDICATIONS TO TAKE  IIF YOU HAVE SLEEP APNEA AND USE CPAP OR BIPAP--PLEASE BRING THE MASK AND THE TUBING.  DO NOT BRING YOUR MACHINE.  DO NOT BRING VALUABLES, MONEY, CREDIT CARDS.  DO NOT WEAR JEWELRY, MAKE-UP, NAIL POLISH AND NO METAL PINS OR CLIPS IN YOUR HAIR. CONTACT LENS, DENTURES / PARTIALS, GLASSES SHOULD NOT BE WORN TO SURGERY AND IN MOST CASES-HEARING AIDS WILL NEED TO BE REMOVED.  BRING YOUR GLASSES CASE, ANY EQUIPMENT NEEDED FOR YOUR CONTACT LENS. FOR PATIENTS ADMITTED TO THE HOSPITAL--CHECK OUT TIME THE DAY OF DISCHARGE IS 11:00 AM.  ALL INPATIENT ROOMS ARE PRIVATE - WITH BATHROOM, TELEPHONE, TELEVISION AND WIFI INTERNET.  IF YOU ARE BEING DISCHARGED THE SAME DAY OF YOUR SURGERY--YOU CAN NOT DRIVE YOURSELF HOME--AND SHOULD NOT GO HOME ALONE BY TAXI OR BUS.  NO DRIVING OR OPERATING MACHINERY, OR MAKING LEGAL DECISIONS FOR 24 HOURS FOLLOWING ANESTHESIA / PAIN MEDICATIONS.  PLEASE MAKE ARRANGEMENTS FOR SOMEONE TO BE WITH YOU AT HOME THE FIRST 24 HOURS AFTER SURGERY. RESPONSIBLE DRIVER'S NAME / PHONE  PT WILL MAKE ARRANGEMENTS FOR HER SON TO COME WITH HER.                                                    PLEASE BE AWARE THAT YOU MAY NEED ADDITIONAL BLOOD DRAWN DAY OF YOUR SURGERY  _______________________________________________________________________   [facility] - Preparing for Surgery Before surgery, you can play an important  role.  Because skin is not sterile, your skin needs to be as free of germs as possible.  You can reduce the number of germs on your skin by washing with CHG (chlorahexidine gluconate) soap before surgery.  CHG is an antiseptic cleaner which kills germs and bonds with the skin to continue killing germs even after washing. Please DO NOT use if you have an allergy to CHG or antibacterial soaps.  If your skin becomes reddened/irritated stop using the CHG and inform your nurse when you arrive at Short Stay. Do not shave (including legs and underarms) for at least 48 hours prior to the first CHG shower.  You may shave your face/neck. Please follow these instructions carefully:  1.  Shower with CHG Soap the night before surgery and the  morning of Surgery.  2.  If you choose to wash your hair, wash your hair first as usual with your  normal  shampoo.  3.  After you shampoo, rinse your hair and body thoroughly to remove the  shampoo.                           4.  Use CHG as you would any other liquid soap.  You can apply chg directly  to the skin and wash                       Gently with a scrungie or clean washcloth.  5.  Apply the CHG Soap to your body ONLY FROM THE NECK DOWN.   Do not use on face/ open                           Wound or open sores. Avoid contact with eyes, ears mouth and genitals (private parts).                       Wash face,  Genitals (private parts) with your normal soap.             6.  Wash thoroughly, paying special attention to the area where your surgery  will be performed.  7.  Thoroughly rinse your body with warm water from the neck down.  8.  DO NOT shower/wash with your normal soap after using and rinsing off  the CHG Soap.                9.  Pat yourself dry with a clean towel.            10.  Wear clean pajamas.            11.  Place clean sheets on your bed the night of your first shower and do not  sleep with pets. Day of Surgery : Do not apply any lotions/deodorants  the morning of surgery.  Please wear clean clothes to the hospital/surgery center.  FAILURE TO FOLLOW THESE INSTRUCTIONS MAY RESULT IN THE CANCELLATION OF YOUR SURGERY PATIENT SIGNATURE_________________________________  NURSE SIGNATURE__________________________________  ________________________________________________________________________ [facility] - Preparing for Surgery Before surgery, you can play an important role.  Because skin is not sterile, your skin needs to be as free of germs as possible.  You can reduce the number of germs on your skin by washing with CHG (chlorahexidine gluconate) soap before surgery.  CHG is an antiseptic cleaner which kills germs and bonds with the skin to continue killing germs even after washing. Please DO NOT use if you have an allergy to CHG or antibacterial soaps.  If your skin becomes reddened/irritated stop using the CHG and inform your nurse when you arrive at Short Stay. Do not shave (including legs and underarms) for at least 48 hours prior to the first CHG shower.  You may shave your face/neck. Please follow these instructions carefully:  1.  Shower with CHG Soap the night before surgery and the  morning of Surgery.  2.  If you choose to wash your hair, wash your hair first as usual with your  normal  shampoo.  3.  After you shampoo, rinse your hair and body thoroughly to remove the  shampoo.                           4.  Use CHG as you would any other liquid soap.  You can apply chg directly  to the skin and wash                       Gently with a scrungie or clean washcloth.  5.  Apply the CHG Soap to your body ONLY FROM THE NECK DOWN.   Do  not use on face/ open                           Wound or open sores. Avoid contact with eyes, ears mouth and genitals (private parts).                       Wash face,  Genitals (private parts) with your normal soap.             6.  Wash thoroughly, paying special attention to the area where your surgery  will  be performed.  7.  Thoroughly rinse your body with warm water from the neck down.  8.  DO NOT shower/wash with your normal soap after using and rinsing off  the CHG Soap.                9.  Pat yourself dry with a clean towel.            10.  Wear clean pajamas.            11.  Place clean sheets on your bed the night of your first shower and do not  sleep with pets. Day of Surgery : Do not apply any lotions/deodorants the morning of surgery.  Please wear clean clothes to the hospital/surgery center.  FAILURE TO FOLLOW THESE INSTRUCTIONS MAY RESULT IN THE CANCELLATION OF YOUR SURGERY PATIENT SIGNATURE_________________________________  NURSE SIGNATURE__________________________________  ________________________________________________________________________

## 2014-03-11 ENCOUNTER — Other Ambulatory Visit (INDEPENDENT_AMBULATORY_CARE_PROVIDER_SITE_OTHER): Payer: Self-pay | Admitting: Surgery

## 2014-03-11 MED ORDER — VANCOMYCIN HCL 10 G IV SOLR: 1500.0000 mg | INTRAVENOUS | Status: DC

## 2014-03-11 NOTE — Progress Notes (Signed)
Added Vanco IV OCTOR

## 2014-03-14 NOTE — Anesthesia Preprocedure Evaluation (Addendum)
Anesthesia Evaluation  Patient identified by MRN, date of birth, ID band Patient awake    Reviewed: Allergy & Precautions, H&P , NPO status , Patient's Chart, lab work & pertinent test results  Airway Mallampati: [name] TM Distance: <3 FB Neck ROM: Full    Dental no notable dental hx.    Pulmonary sleep apnea and Continuous Positive Airway Pressure Ventilation , Current Smoker, PE breath sounds clear to auscultation  + decreased breath sounds      Cardiovascular hypertension, Pt. on medications DVT Rhythm:Regular Rate:Normal     Neuro/Psych negative neurological ROS  negative psych ROS   GI/Hepatic negative GI ROS, Neg liver ROS,   Endo/Other  negative endocrine ROSMorbid obesity  Renal/GU negative Renal ROS  negative genitourinary   Musculoskeletal negative musculoskeletal ROS (+)   Abdominal   Peds negative pediatric ROS (+)  Hematology negative hematology ROS (+)   Anesthesia Other Findings   Reproductive/Obstetrics negative OB ROS                          Anesthesia Physical Anesthesia Plan  ASA: [name]  Anesthesia Plan: General   Post-op Pain Management:    Induction: Intravenous  Airway Management Planned: Oral ETT  Additional Equipment:   Intra-op Plan:   Post-operative Plan: Extubation in OR  Informed Consent: I have reviewed the patients History and Physical, chart, labs and discussed the procedure including the risks, benefits and alternatives for the proposed anesthesia with the patient or authorized representative who has indicated his/her understanding and acceptance.   Dental advisory given  Plan Discussed with: CRNA  Anesthesia Plan Comments:         Anesthesia Quick Evaluation

## 2014-03-15 ENCOUNTER — Encounter (HOSPITAL_COMMUNITY): Payer: Self-pay | Admitting: *Deleted

## 2014-03-15 ENCOUNTER — Ambulatory Visit (HOSPITAL_COMMUNITY): Payer: BC Managed Care – PPO | Admitting: Anesthesiology

## 2014-03-15 ENCOUNTER — Ambulatory Visit (HOSPITAL_COMMUNITY)
Admission: RE | Admit: 2014-03-15 | Discharge: 2014-03-15 | Disposition: A | Discharge status: Home / Self Care | Payer: BC Managed Care – PPO | Source: Ambulatory Visit | Attending: Surgery | Admitting: Surgery

## 2014-03-15 ENCOUNTER — Encounter (HOSPITAL_COMMUNITY): Payer: BC Managed Care – PPO | Admitting: Anesthesiology

## 2014-03-15 ENCOUNTER — Encounter (HOSPITAL_COMMUNITY)
Admission: RE | Disposition: A | Discharge status: Home / Self Care | Payer: Self-pay | Source: Ambulatory Visit | Attending: Surgery

## 2014-03-15 DIAGNOSIS — K660 Peritoneal adhesions (postprocedural) (postinfection): Secondary | ICD-10-CM | POA: Insufficient documentation

## 2014-03-15 DIAGNOSIS — G4733 Obstructive sleep apnea (adult) (pediatric): Secondary | ICD-10-CM | POA: Insufficient documentation

## 2014-03-15 DIAGNOSIS — Z79899 Other long term (current) drug therapy: Secondary | ICD-10-CM | POA: Insufficient documentation

## 2014-03-15 DIAGNOSIS — I10 Essential (primary) hypertension: Secondary | ICD-10-CM | POA: Insufficient documentation

## 2014-03-15 DIAGNOSIS — R002 Palpitations: Secondary | ICD-10-CM | POA: Insufficient documentation

## 2014-03-15 DIAGNOSIS — Z7901 Long term (current) use of anticoagulants: Secondary | ICD-10-CM | POA: Insufficient documentation

## 2014-03-15 DIAGNOSIS — Z85048 Personal history of other malignant neoplasm of rectum, rectosigmoid junction, and anus: Secondary | ICD-10-CM | POA: Insufficient documentation

## 2014-03-15 DIAGNOSIS — D126 Benign neoplasm of colon, unspecified: Secondary | ICD-10-CM

## 2014-03-15 DIAGNOSIS — F17200 Nicotine dependence, unspecified, uncomplicated: Secondary | ICD-10-CM | POA: Insufficient documentation

## 2014-03-15 DIAGNOSIS — I82509 Chronic embolism and thrombosis of unspecified deep veins of unspecified lower extremity: Secondary | ICD-10-CM | POA: Insufficient documentation

## 2014-03-15 DIAGNOSIS — I6529 Occlusion and stenosis of unspecified carotid artery: Secondary | ICD-10-CM | POA: Insufficient documentation

## 2014-03-15 DIAGNOSIS — Z860100 Personal history of colon polyps, unspecified: Secondary | ICD-10-CM | POA: Insufficient documentation

## 2014-03-15 DIAGNOSIS — M1990 Unspecified osteoarthritis, unspecified site: Secondary | ICD-10-CM | POA: Insufficient documentation

## 2014-03-15 DIAGNOSIS — I2782 Chronic pulmonary embolism: Secondary | ICD-10-CM | POA: Insufficient documentation

## 2014-03-15 HISTORY — PX: CECOSTOMY: SHX1316

## 2014-03-15 HISTORY — PX: LAPAROSCOPIC LYSIS OF ADHESIONS: SHX5905

## 2014-03-15 HISTORY — PX: LAPAROSCOPIC APPENDECTOMY: SHX408

## 2014-03-15 LAB — PROTIME-INR
INR: 1.11 (ref 0.00–1.49)
Prothrombin Time: 14.1 seconds (ref 11.6–15.2)

## 2014-03-15 SURGERY — CREATION, CECOSTOMY
Anesthesia: General | Site: Abdomen

## 2014-03-15 MED ORDER — SUCCINYLCHOLINE CHLORIDE 20 MG/ML IJ SOLN
INTRAMUSCULAR | Status: DC | PRN
  Administered 2014-03-15 (×2): 100 mg via INTRAVENOUS

## 2014-03-15 MED ORDER — CISATRACURIUM BESYLATE (PF) 10 MG/5ML IV SOLN
INTRAVENOUS | Status: DC | PRN
  Administered 2014-03-15: 4 mg via INTRAVENOUS
  Administered 2014-03-15: 2 mg via INTRAVENOUS
  Administered 2014-03-15: 6 mg via INTRAVENOUS

## 2014-03-15 MED ORDER — ONDANSETRON HCL 4 MG/2ML IJ SOLN
INTRAMUSCULAR | Status: DC | PRN
  Administered 2014-03-15: 4 mg via INTRAVENOUS

## 2014-03-15 MED ORDER — MIDAZOLAM HCL 5 MG/5ML IJ SOLN
INTRAMUSCULAR | Status: DC | PRN
  Administered 2014-03-15: 2 mg via INTRAVENOUS

## 2014-03-15 MED ORDER — PROPOFOL 10 MG/ML IV BOLUS
INTRAVENOUS | Status: DC | PRN
  Administered 2014-03-15: 150 mg via INTRAVENOUS
  Administered 2014-03-15: 50 mg via INTRAVENOUS

## 2014-03-15 MED ORDER — 0.9 % SODIUM CHLORIDE (POUR BTL) OPTIME
TOPICAL | Status: DC | PRN
  Administered 2014-03-15: 1000 mL

## 2014-03-15 MED ORDER — KETOROLAC TROMETHAMINE 30 MG/ML IJ SOLN: 15.0000 mg | Freq: Once | INTRAMUSCULAR | Status: DC | PRN

## 2014-03-15 MED ORDER — CISATRACURIUM BESYLATE 20 MG/10ML IV SOLN
INTRAVENOUS | Status: AC
  Filled 2014-03-15: qty 10

## 2014-03-15 MED ORDER — PROMETHAZINE HCL 25 MG/ML IJ SOLN: 6.2500 mg | INTRAMUSCULAR | Status: DC | PRN

## 2014-03-15 MED ORDER — OXYCODONE HCL 5 MG PO CAPS: 5.0000 mg | ORAL_CAPSULE | ORAL | Status: DC | PRN

## 2014-03-15 MED ORDER — METOCLOPRAMIDE HCL 5 MG/ML IJ SOLN
INTRAMUSCULAR | Status: DC | PRN
  Administered 2014-03-15: 10 mg via INTRAVENOUS

## 2014-03-15 MED ORDER — DEXAMETHASONE SODIUM PHOSPHATE 4 MG/ML IJ SOLN: INTRAMUSCULAR | Status: DC | PRN

## 2014-03-15 MED ORDER — KETOROLAC TROMETHAMINE 30 MG/ML IJ SOLN
INTRAMUSCULAR | Status: AC
  Filled 2014-03-15: qty 1

## 2014-03-15 MED ORDER — ALVIMOPAN 12 MG PO CAPS
12.0000 mg | ORAL_CAPSULE | ORAL | Status: AC
  Administered 2014-03-15: 12 mg via ORAL
  Filled 2014-03-15: qty 1

## 2014-03-15 MED ORDER — NEOSTIGMINE METHYLSULFATE 10 MG/10ML IV SOLN
INTRAVENOUS | Status: AC
  Filled 2014-03-15: qty 1

## 2014-03-15 MED ORDER — GLYCOPYRROLATE 0.2 MG/ML IJ SOLN
INTRAMUSCULAR | Status: DC | PRN
  Administered 2014-03-15: 0.6 mg via INTRAVENOUS

## 2014-03-15 MED ORDER — KETAMINE HCL 10 MG/ML IJ SOLN
INTRAMUSCULAR | Status: AC
  Filled 2014-03-15: qty 1

## 2014-03-15 MED ORDER — FENTANYL CITRATE 0.05 MG/ML IJ SOLN
INTRAMUSCULAR | Status: AC
  Filled 2014-03-15: qty 5

## 2014-03-15 MED ORDER — LACTATED RINGERS IV SOLN
INTRAVENOUS | Status: DC | PRN
  Administered 2014-03-15 (×2): via INTRAVENOUS

## 2014-03-15 MED ORDER — HYDROMORPHONE HCL PF 1 MG/ML IJ SOLN: 0.2500 mg | INTRAMUSCULAR | Status: DC | PRN

## 2014-03-15 MED ORDER — ACETAMINOPHEN 10 MG/ML IV SOLN
1000.0000 mg | Freq: Once | INTRAVENOUS | Status: AC
  Administered 2014-03-15: 1000 mg via INTRAVENOUS
  Filled 2014-03-15: qty 100

## 2014-03-15 MED ORDER — GLYCOPYRROLATE 0.2 MG/ML IJ SOLN
INTRAMUSCULAR | Status: AC
  Filled 2014-03-15: qty 3

## 2014-03-15 MED ORDER — ONDANSETRON HCL 4 MG/2ML IJ SOLN
INTRAMUSCULAR | Status: AC
  Filled 2014-03-15: qty 2

## 2014-03-15 MED ORDER — CEFOTETAN DISODIUM-DEXTROSE 2-2.08 GM-% IV SOLR
INTRAVENOUS | Status: AC
  Filled 2014-03-15: qty 50

## 2014-03-15 MED ORDER — FENTANYL CITRATE 0.05 MG/ML IJ SOLN
INTRAMUSCULAR | Status: AC
  Filled 2014-03-15: qty 2

## 2014-03-15 MED ORDER — KETOROLAC TROMETHAMINE 30 MG/ML IJ SOLN
INTRAMUSCULAR | Status: DC | PRN
  Administered 2014-03-15: 30 mg via INTRAVENOUS

## 2014-03-15 MED ORDER — FENTANYL CITRATE 0.05 MG/ML IJ SOLN
25.0000 ug | INTRAMUSCULAR | Status: DC | PRN
  Administered 2014-03-15 (×2): 25 ug via INTRAVENOUS

## 2014-03-15 MED ORDER — BUPIVACAINE-EPINEPHRINE 0.25% -1:200000 IJ SOLN
INTRAMUSCULAR | Status: AC
  Filled 2014-03-15: qty 1

## 2014-03-15 MED ORDER — MIDAZOLAM HCL 2 MG/2ML IJ SOLN
INTRAMUSCULAR | Status: AC
  Filled 2014-03-15: qty 2

## 2014-03-15 MED ORDER — METOCLOPRAMIDE HCL 5 MG/ML IJ SOLN
INTRAMUSCULAR | Status: AC
  Filled 2014-03-15: qty 2

## 2014-03-15 MED ORDER — PROPOFOL 10 MG/ML IV BOLUS
INTRAVENOUS | Status: AC
  Filled 2014-03-15: qty 20

## 2014-03-15 MED ORDER — NEOSTIGMINE METHYLSULFATE 10 MG/10ML IV SOLN
INTRAVENOUS | Status: DC | PRN
  Administered 2014-03-15: 5 mg via INTRAVENOUS

## 2014-03-15 MED ORDER — BUPIVACAINE-EPINEPHRINE 0.25% -1:200000 IJ SOLN
INTRAMUSCULAR | Status: DC | PRN
  Administered 2014-03-15: 50 mL

## 2014-03-15 MED ORDER — ALVIMOPAN 12 MG PO CAPS: 12.0000 mg | ORAL_CAPSULE | Freq: Once | ORAL | Status: DC

## 2014-03-15 MED ORDER — KETAMINE HCL 10 MG/ML IJ SOLN
INTRAMUSCULAR | Status: DC | PRN
  Administered 2014-03-15: 50 mg via INTRAVENOUS
  Administered 2014-03-15 (×2): 10 mg via INTRAVENOUS

## 2014-03-15 MED ORDER — FENTANYL CITRATE 0.05 MG/ML IJ SOLN
INTRAMUSCULAR | Status: DC | PRN
  Administered 2014-03-15: 100 ug via INTRAVENOUS

## 2014-03-15 MED ORDER — DEXAMETHASONE SODIUM PHOSPHATE 4 MG/ML IJ SOLN
INTRAMUSCULAR | Status: DC | PRN
  Administered 2014-03-15: 10 mg via INTRAVENOUS

## 2014-03-15 MED ORDER — VANCOMYCIN HCL 10 G IV SOLR
1500.0000 mg | Freq: Once | INTRAVENOUS | Status: AC
  Administered 2014-03-15: 1500 mg via INTRAVENOUS
  Filled 2014-03-15: qty 1500

## 2014-03-15 MED ORDER — DEXAMETHASONE SODIUM PHOSPHATE 10 MG/ML IJ SOLN
INTRAMUSCULAR | Status: AC
  Filled 2014-03-15: qty 1

## 2014-03-15 MED ORDER — CEFOTETAN DISODIUM-DEXTROSE 2-2.08 GM-% IV SOLR
2.0000 g | INTRAVENOUS | Status: AC
  Administered 2014-03-15: 2 g via INTRAVENOUS

## 2014-03-15 MED ORDER — LACTATED RINGERS IR SOLN
Status: DC | PRN
  Administered 2014-03-15: 1

## 2014-03-15 SURGICAL SUPPLY — 49 items
ADH SKN CLS APL DERMABOND .7 (GAUZE/BANDAGES/DRESSINGS) ×1
APPLIER CLIP 5 13 M/L LIGAMAX5 (MISCELLANEOUS)
APPLIER CLIP ROT 10 11.4 M/L (STAPLE)
APR CLP MED LRG 11.4X10 (STAPLE)
APR CLP MED LRG 5 ANG JAW (MISCELLANEOUS)
BAG SPEC RTRVL LRG 6X4 10 (ENDOMECHANICALS)
CANISTER SUCTION 2500CC (MISCELLANEOUS) ×3 IMPLANT
CLIP APPLIE 5 13 M/L LIGAMAX5 (MISCELLANEOUS) IMPLANT
CLIP APPLIE ROT 10 11.4 M/L (STAPLE) IMPLANT
COVER MAYO STAND STRL (DRAPES) ×2 IMPLANT
CUTTER FLEX LINEAR 45M (STAPLE) IMPLANT
DECANTER SPIKE VIAL GLASS SM (MISCELLANEOUS) ×3 IMPLANT
DERMABOND ADVANCED (GAUZE/BANDAGES/DRESSINGS) ×2
DERMABOND ADVANCED .7 DNX12 (GAUZE/BANDAGES/DRESSINGS) IMPLANT
DEVICE TROCAR PUNCTURE CLOSURE (ENDOMECHANICALS) ×2 IMPLANT
DRAPE LAPAROSCOPIC ABDOMINAL (DRAPES) ×3 IMPLANT
DRAPE UTILITY XL STRL (DRAPES) ×3 IMPLANT
DRAPE WARM FLUID 44X44 (DRAPE) ×3 IMPLANT
DRSG TEGADERM 2-3/8X2-3/4 SM (GAUZE/BANDAGES/DRESSINGS) ×6 IMPLANT
DRSG TEGADERM 4X4.75 (GAUZE/BANDAGES/DRESSINGS) ×3 IMPLANT
ELECT REM PT RETURN 9FT ADLT (ELECTROSURGICAL) ×3
ELECTRODE REM PT RTRN 9FT ADLT (ELECTROSURGICAL) ×1 IMPLANT
ENDOLOOP SUT PDS II  0 18 (SUTURE)
ENDOLOOP SUT PDS II 0 18 (SUTURE) IMPLANT
GLOVE ECLIPSE 8.0 STRL XLNG CF (GLOVE) ×3 IMPLANT
GLOVE INDICATOR 8.0 STRL GRN (GLOVE) ×3 IMPLANT
GOWN STRL REUS W/TWL XL LVL3 (GOWN DISPOSABLE) ×9 IMPLANT
KIT BASIN OR (CUSTOM PROCEDURE TRAY) ×3 IMPLANT
POUCH SPECIMEN RETRIEVAL 10MM (ENDOMECHANICALS) IMPLANT
RELOAD 45 VASCULAR/THIN (ENDOMECHANICALS) IMPLANT
RELOAD BLUE (STAPLE) ×2 IMPLANT
RELOAD STAPLE 45 2.5 WHT GRN (ENDOMECHANICALS) IMPLANT
RELOAD STAPLE 45 3.5 BLU ETS (ENDOMECHANICALS) IMPLANT
RELOAD STAPLE TA45 3.5 REG BLU (ENDOMECHANICALS) IMPLANT
SCISSORS LAP 5X35 DISP (ENDOMECHANICALS) ×2 IMPLANT
SET IRRIG TUBING LAPAROSCOPIC (IRRIGATION / IRRIGATOR) ×3 IMPLANT
SHEARS HARMONIC ACE PLUS 36CM (ENDOMECHANICALS) ×2 IMPLANT
SLEEVE XCEL OPT CAN 5 100 (ENDOMECHANICALS) ×7 IMPLANT
SPONGE GAUZE 4X4 12PLY (GAUZE/BANDAGES/DRESSINGS) ×2 IMPLANT
STAPLE ECHEON FLEX 60 POW ENDO (STAPLE) ×2 IMPLANT
SUT MNCRL AB 4-0 PS2 18 (SUTURE) ×3 IMPLANT
SUT SILK 2 0 SH (SUTURE) ×2 IMPLANT
TOWEL OR 17X26 10 PK STRL BLUE (TOWEL DISPOSABLE) ×3 IMPLANT
TOWEL OR NON WOVEN STRL DISP B (DISPOSABLE) ×3 IMPLANT
TRAY FOLEY CATH 14FRSI W/METER (CATHETERS) ×3 IMPLANT
TRAY LAP CHOLE (CUSTOM PROCEDURE TRAY) ×3 IMPLANT
TROCAR BLADELESS OPT 5 100 (ENDOMECHANICALS) ×3 IMPLANT
TROCAR XCEL 12X100 BLDLESS (ENDOMECHANICALS) ×3 IMPLANT
TUBING INSUFFLATION 10FT LAP (TUBING) ×3 IMPLANT

## 2014-03-15 NOTE — H&P (Signed)
[place], MD, [address] [place], [place] [address] Phone: [phone] FAX: [place]  [birth year] [id number]  CARE TEAM:  PCP: Michail Jewels, MD  Outpatient Care Team: Patient Care Team: Jones Bales, MD as PCP - General (Internal Medicine) Milta Deiters, MD as Referring Physician (Internal Medicine) Fay Records, MD as Consulting Physician (Cardiology) Gatha Mayer, MD as Consulting Physician (Gastroenterology) Adin Hector, MD as Consulting Physician (General Surgery) Caryl Bis, [facility] as Pharmacist (Pharmacist)  Inpatient Treatment Team: Treatment Team: Attending Provider: Adin Hector, MD  This patient is a 58 y.o.female who presents today for surgical evaluation at the request of Dr. Carlean Purl.   Reason for visit: Sessile serrated adenoma of appendiceal orifice   Pleasant morbidly obese smoking female. I had performed a laparoscopic low anterior resection for a T1 N0 cancer of the rectum in 2010. She had endoscopy by Dr Carlean Purl in 2012. There was concern of some abnormal tissue at the appendiceal orifice. Biopsy showed some lymphoid aggregate. Followup colonoscopy now 3 years later shows evidence of sessile serrated adenoma at appendiceal orifice. Patient sent to me for consideration of removal of the adenoma by appendectomy since cannot be removed endoscopically.  She had struggled with some mild intermittent abdominal pains but that is much improved. She had issues with constipation but it is improved. She continues to work. She comes today with her husband. She is down to a few cigarettes a day, smoking less. She can walk a half hour without much difficulty. Having daily bowel movements. No skin infections. No operations since the low anterior resection in 2010.    Past Medical History  Diagnosis Date  . Deep vein thrombosis     left lower  extremity  . Pulmonary embolism     LAST ONE WAS 1995- ON CHRONIC COUMADIN  . Allergy   . Anemia   . Hypertension   . History of rectal cancer     T1N0 resected 08/2009  . Diverticulitis large intestine   . Obesity   . Infectious colitis   . Sleep apnea 08/09/2012    Sleep study 02/2013 : Severe obstructive sleep apnea/hypopnea syndrome, AHI 88.7, desat to 84%. CPAP titration to 15 CWP, AHI 0 per hour. Medium ResMed Mirage Quattro full-face mask with heated humidifier.      . Pain     LOWER BACK AND RT KNEE AND BOTH SHOULDERS - LEFT SHOULDER PAIN WORSE--PT TOLD ARTHRITIS  . Arthritis     DJD, ARTHRITIS  . Heart palpitations     HX OF PALPITATIONS AND SOME DIZZINESS - EVALUATED BY CARDIOLOGIST DR. ROSS - OFFICE NOTES IS EPIC 08/24/13 - "EVENT MONITOR SHOWED NO SIGNIFICANT ARRHYTHMIA"  . Carotid artery plaque 08-30-13    PER CAROTID DOPPLER STUDY    Past Surgical History  Procedure Laterality Date  . Abdominal hysterectomy    . Tubal ligation    . Transanal rectal resection  08/2009    T1N0 rectal cancer Dr. Johney Maine  . Colonoscopy      History   Social History  . Marital Status: Single    Spouse Name: N/A    Number of Children: N/A  . 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 Smoking status: Current Some Day Smoker -- 0.25 packs/day for 40 years  Types: Cigarettes  . Smokeless tobacco: Never Used  . Alcohol Use: No  . Drug Use: No  . Sexual Activity: Not on file   Other Topics Concern  . Not on file   Social History Narrative   Patient does not regular exercise. He has three boys. Daily caffeine use 2/day.    Family History  Problem Relation Age of Onset  . Cancer Mother     unknown type but patient thinks it was bone marrow cancer  . Heart disease Sister   . Heart disease Brother   . Clotting disorder Brother   . Colon cancer Neg Hx   . Stomach cancer Neg Hx     Current Facility-Administered  Medications  Medication Dose Route Frequency Provider Last Rate Last Dose  . acetaminophen (OFIRMEV) IV 1,000 mg  1,000 mg Intravenous Once Adin Hector, MD      . alvimopan (ENTEREG) capsule 12 mg  12 mg Oral Once Adin Hector, MD      . cefoTEtan in Dextrose 5% (CEFOTAN) IVPB 2 g  2 g Intravenous On Call to [place], MD         Allergies  Allergen Reactions  . Other     SOME ADHESIVES CAUSE SKIN IRRITATION  - STATES PAPER TAPE OK.  STATES SOME EKG ELECTRODES AND SOME SKIN PATCHES ALSO CAUSE IRRITATION    ROS: Review of Systems  Constitutional: Negative for fever, chills, diaphoresis, appetite change and fatigue.  HENT: Negative for ear discharge, ear pain, sore throat and trouble swallowing.  Eyes: Negative for photophobia, discharge and visual disturbance.  Respiratory: Negative for cough, choking, chest tightness and shortness of breath.  Cardiovascular: Positive for leg swelling. Negative for chest pain and palpitations.  Gastrointestinal: Negative for nausea, vomiting, abdominal pain, diarrhea, constipation, anal bleeding and rectal pain.  Endocrine: Negative for cold intolerance and heat intolerance.  Genitourinary: Negative for dysuria, frequency and difficulty urinating.  Musculoskeletal: Positive for arthralgias. Negative for gait problem, myalgias and neck pain.  Skin: Negative for color change, pallor and rash.  Allergic/Immunologic: Negative for environmental allergies, food allergies and immunocompromised state.  Neurological: Negative for dizziness, speech difficulty, weakness and numbness.  Hematological: Negative for adenopathy.  Psychiatric/Behavioral: Negative for confusion and agitation. The patient is not nervous/anxious.     BP 124/64  Pulse 81  Temp(Src) 98.4 F (36.9 C)  Resp 18  SpO2 98%  Physical Exam: Physical Exam  Constitutional: She is oriented to person, place, and time. She appears well-developed and well-nourished. No distress.   HENT:  Head: Normocephalic.    Mouth/Throat: Oropharynx is clear and moist. No oropharyngeal exudate.  Eyes: Conjunctivae and EOM are normal. Pupils are equal, round, and reactive to light. No scleral icterus.  Neck: Normal range of motion. Neck supple. No tracheal deviation present.  Cardiovascular: Normal rate, regular rhythm and intact distal pulses.  Pulmonary/Chest: Effort normal and breath sounds normal. No stridor. No respiratory distress. She exhibits no tenderness.  Abdominal: Soft. She exhibits no distension and no mass. There is no tenderness. There is no rigidity, no rebound, no guarding, no tenderness at McBurney's point and negative Murphy's sign. No hernia. Hernia confirmed negative in the ventral area, confirmed negative in the right inguinal area and confirmed negative in the left inguinal area.    Genitourinary: No vaginal discharge found.  Musculoskeletal: Normal range of motion. She exhibits no tenderness.  Right elbow: She exhibits normal range of motion.  Left elbow: She exhibits normal  range of motion.  Right wrist: She exhibits normal range of motion.  Left wrist: She exhibits normal range of motion.  Right hand: Normal strength noted.  Left hand: Normal strength noted.  Lymphadenopathy:  Head (right side): No posterior auricular adenopathy present.  Head (left side): No posterior auricular adenopathy present.  She has no cervical adenopathy.  She has no axillary adenopathy.  Right: No inguinal adenopathy present.  Left: No inguinal adenopathy present.  Neurological: She is alert and oriented to person, place, and time. No cranial nerve deficit. She exhibits normal muscle tone. Coordination normal.  Skin: Skin is warm and dry. No rash noted. She is not diaphoretic. No erythema.  Psychiatric: She has a normal mood and affect. Her behavior is normal. Judgment and thought content normal.     Results:   Labs: No results found for this or any previous visit  (from the past 48 hour(s)).  Imaging / Studies: Dg Chest 2 View  03/08/2014   CLINICAL DATA:  Preop appendectomy. Hypertension. Smoker. History of pulmonary emboli.  EXAM: CHEST  2 VIEW  COMPARISON:  07/17/2012  FINDINGS: The heart size and mediastinal contours are within normal limits. Both lungs are clear. The visualized skeletal structures are unremarkable.  IMPRESSION: No active cardiopulmonary disease.   Electronically Signed   By: Rolm Baptise M.D.   On: 03/08/2014 15:46    Medications / Allergies: per chart  Antibiotics: Anti-infectives   Start     Dose/Rate Route Frequency Ordered Stop   03/15/14 0535  cefoTEtan in Dextrose 5% (CEFOTAN) IVPB 2 g    Comments:  Pharmacy may adjust dose strength for optimal dosing.   Send with patient on call to the OR.  Anesthesia to complete antibiotic administration <38min prior to incision per [facility].   2 g Intravenous On call to O.R. 03/15/14 0535 03/16/14 0559      Assessment  Alicia Martinez  58 y.o. female  Day of Surgery  Procedure(s): partial CECECTOMY APPENDECTOMY LAPAROSCOPIC possible open   Problem List:  Active Problems:   * No active hospital problems. *   Tissue at appendiceal orifice. Biopsy consistent with sessile serrated adenoma.  No evidence of recurrence of stage I rectal cancer s/p LAR 2010.   Plan: Because of adenomatous changes at the appendiceal orifice, I think she would benefit from removal. I think it is reasonable to do laparoscopic appendectomy with resection of the cecal wall. This looked too large on endoscopic view. Try to avoid ileocecectomy possible. I discussed with the patient and her husband who is also patient of mine. They agree:   The anatomy & physiology of the digestive tract was discussed. The pathophysiology of adenoomas was discussed. Natural history risks without surgery was discussed. I feel the risks of no intervention will lead to serious problems that outweigh the operative  risks; therefore, I recommended diagnostic laparoscopy with removal of appendix & part of cecal wall to remove the pathology. Laparoscopic & open techniques were discussed. I noted a good likelihood this will help address the problem.  Risks such as bleeding, infection, abscess, leak, reoperation, possible ostomy, hernia, heart attack, death, and other risks were discussed. Goals of post-operative recovery were discussed as well. We will work to minimize complications. Questions were answered. The patient & husband express understanding & wish to proceed with surgery.   STOP SMOKING! We talked to the patient about the dangers of smoking. We stressed that tobacco use dramatically increases the risk of peri-operative complications such  as infection, tissue necrosis leaving to problems with incision/wound and organ healing, hernia, chronic pain, heart attack, stroke, DVT, pulmonary embolism, and death. We noted there are programs in our community to help stop smoking. Information was available.        Adin Hector, M.D., F.A.C.S. Gastrointestinal and Minimally Invasive Surgery [facility] [address] [place], [place] [address] [phone] Main / Paging   03/15/2014  Note: This dictation was prepared with Dragon/digital dictation along with [facility] technology. Any transcriptional errors that result from this process are unintentional.

## 2014-03-15 NOTE — Progress Notes (Signed)
Telephone call with Pharmacist, Paulla Dolly and Dr Michael Boston to clarify instructions for lovenox and coumadin after out patient surgery today.

## 2014-03-15 NOTE — Op Note (Signed)
10:05 AM  PATIENT:  Alicia Martinez  58 y.o. female  Patient Care Team: Jones Bales, MD as PCP - General (Internal Medicine) Milta Deiters, MD as Referring Physician (Internal Medicine) Fay Records, MD as Consulting Physician (Cardiology) Gatha Mayer, MD as Consulting Physician (Gastroenterology) Adin Hector, MD as Consulting Physician (General Surgery) Alphonsa Gin [name], [facility] as Pharmacist (Pharmacist)  PRE-OPERATIVE DIAGNOSIS:  sessile serrated ademona of appendiceal orifice   POST-OPERATIVE DIAGNOSIS:  sessile serrated adenona of appendiceal orifice   PROCEDURE:  Procedure(s): partial CECECTOMY APPENDECTOMY LAPAROSCOPIC LAPAROSCOPIC LYSIS OF ADHESIONS x34min  SURGEON:  Surgeon(s): Adin Hector, MD  ANESTHESIA:   Local & GETA  EBL:  Total I/O In: 1000 [I.V.:1000] Out: 250 [Urine:250]  Delay start of Pharmacological VTE agent (>24hrs) due to surgical blood loss or risk of bleeding:  no  DRAINS: none   SPECIMEN:  Source of Specimen:  Appendix w cecal wall  DISPOSITION OF SPECIMEN:  PATHOLOGY  COUNTS:  YES  PLAN OF CARE: Discharge to home after PACU  PATIENT DISPOSITION:  PACU - hemodynamically stable.   INDICATIONS: Patient w h/o rectal cancer & colon polyps.  She is not have a persistent polyp at the appendiceal orifice and unable to be safely removed endoscopically.  Surgery was recommended:  The anatomy & physiology of the digestive tract was discussed.  The pathophysiology of appendicitis was discussed.  Natural history risks without surgery was discussed.   I feel the risks of no intervention will lead to serious problems that outweigh the operative risks; therefore, I recommended diagnostic laparoscopy with removal of appendix & cecal wall to remove the pathology.  Possible need for ileocecectomy discussed.  Laparoscopic & open techniques were discussed.   I noted a good likelihood this will help address the problem.    Risks such as  bleeding, infection, abscess, leak, reoperation, possible ostomy, hernia, heart attack, death, and other risks were discussed.  Goals of post-operative recovery were discussed as well.  We will work to minimize complications.  Questions were answered.  The patient expresses understanding & wishes to proceed with surgery.  OR FINDINGS: Rather dense adhesions to anterior abdominal wall and interloop adhesions.  Very long thin appendix.  Retrocecal/retroperitoneal.  1 cm polyp at appendiceal orifice.  1.5 cm gross margin.  DESCRIPTION:   The patient was identified & brought into the operating room. The patient was positioned supine with arms tucked. SCDs were active during the entire case. The patient underwent general anesthesia without any difficulty.  The abdomen was prepped and draped in a sterile fashion. A Surgical Timeout confirmed our plan.  I placed a 79mm port in the LUQ of the abdomen With the patient in steep reverse Trendelenburg and left side up using optical entry technique.  We induced carbon dioxide insufflation.  Camera inspection revealed no injury.  I placed additional ports under direct laparoscopic visualization.  I later upside the left upper quadrant port to 12 mm.  The patient had dense adhesions of omentum and small bowel to the anterior abdominal wall and pelvis.  Carefully freed those off using focused cold scissors and occasional harmonic dissection.  Eventually freed loop of ileum off that in the sigmoid mesentery.  Eventually freed loops of ileum off of the ascending colon and cecum.  I mobilized the terminal ileum to proximal ascending colon in a lateral to medial fashion.  I took care to avoid injuring any retroperitoneal structures.  I freed the appendix off its attachments  to the ascending colon and cecal mesentery & retroperitoneum.  I skeletonized the mesoappendix. I was able to free around the base of the appendix which was still viable.  I stapled the appendix off  the cecum using a laparoscopic stapler. I took a large cuff of viable cecum. I ligated the mesoappendix and assured hemostasis in the mesentery.  I placed the appendix inside an EndoCatch bag and removed out the 12 mm port.  Inspected the staple line.  I went and and covered the lateral corner staple line with mesentery and cecal wall using interrupted silk sutures to help protect it.  I did copious irrigation. Hemostasis was good in the mesoappendix, colon mesentery, and retroperitoneum. Staple line was intact on the cecum with no bleeding. I washed out the pelvis, retrohepatic space and right paracolic gutter. I washed out the left side as well.  Hemostasis is good. There was no perforation or injury. Because the area cleaned up well after irrigation, I did not place a drain.  I aspirated the carbon dioxide. I removed the ports. I closed the LUQ 12 mm fascia site using a 0 Vicryl stitch. I closed skin using 4-0 monocryl stitch.  Patient was extubated and sent to the recovery room.  I discussed the operative findings with the patient's family. Questions answered. They expressed understanding and appreciation.

## 2014-03-15 NOTE — Discharge Instructions (Addendum)
LAPAROSCOPIC SURGERY: POST OP INSTRUCTIONS ° °1. DIET: Follow a light bland diet the first 24 hours after arrival home, such as soup, liquids, crackers, etc.  Be sure to include lots of fluids daily.  Avoid fast food or heavy meals as your are more likely to get nauseated.  Eat a low fat the next few days after surgery.   °2. Take your usually prescribed home medications unless otherwise directed. °3. PAIN CONTROL: °a. Pain is best controlled by a usual combination of three different methods TOGETHER: °i. Ice/Heat °ii. Over the counter pain medication °iii. Prescription pain medication °b. Most patients will experience some swelling and bruising around the incisions.  Ice packs or heating pads (30-60 minutes up to 6 times a day) will help. Use ice for the first few days to help decrease swelling and bruising, then switch to heat to help relax tight/sore spots and speed recovery.  Some people prefer to use ice alone, heat alone, alternating between ice & heat.  Experiment to what works for you.  Swelling and bruising can take several weeks to resolve.   °c. It is helpful to take an over-the-counter pain medication regularly for the first few weeks.  Choose one of the following that works best for you: °i. Naproxen (Aleve, etc)  Two 220mg tabs twice a day °ii. Ibuprofen (Advil, etc) Three 200mg tabs four times a day (every meal & bedtime) °iii. Acetaminophen (Tylenol, etc) 500-650mg four times a day (every meal & bedtime) °d. A  prescription for pain medication (such as oxycodone, hydrocodone, etc) should be given to you upon discharge.  Take your pain medication as prescribed.  °i. If you are having problems/concerns with the prescription medicine (does not control pain, nausea, vomiting, rash, itching, etc), please call us [phone] to see if we need to switch you to a different pain medicine that will work better for you and/or control your side effect better. °ii. If you need a refill on your pain medication,  please contact your pharmacy.  They will contact our office to request authorization. Prescriptions will not be filled after 5 pm or on week-ends. °4. Avoid getting constipated.  Between the surgery and the pain medications, it is common to experience some constipation.  Increasing fluid intake and taking a fiber supplement (such as Metamucil, Citrucel, FiberCon, MiraLax, etc) 1-2 times a day regularly will usually help prevent this problem from occurring.  A mild laxative (prune juice, Milk of Magnesia, MiraLax, etc) should be taken according to package directions if there are no bowel movements after 48 hours.   °5. Watch out for diarrhea.  If you have many loose bowel movements, simplify your diet to bland foods & liquids for a few days.  Stop any stool softeners and decrease your fiber supplement.  Switching to mild anti-diarrheal medications (Kayopectate, Pepto Bismol) can help.  If this worsens or does not improve, please call us. °6. Wash / shower every day.  You may shower over the dressings as they are waterproof.  Continue to shower over incision(s) after the dressing is off. °7. Remove your waterproof bandages 5 days after surgery.  You may leave the incision open to air.  You may replace a dressing/Band-Aid to cover the incision for comfort if you wish.  °8. ACTIVITIES as tolerated:   °a. You may resume regular (light) daily activities beginning the next day--such as daily self-care, walking, climbing stairs--gradually increasing activities as tolerated.  If you can walk 30 minutes without difficulty, it   is safe to try more intense activity such as jogging, treadmill, bicycling, low-impact aerobics, swimming, etc. b. Save the most intensive and strenuous activity for last such as sit-ups, heavy lifting, contact sports, etc  Refrain from any heavy lifting or straining until you are off narcotics for pain control.   c. DO NOT PUSH THROUGH PAIN.  Let pain be your guide: If it hurts to do something, don't  do it.  Pain is your body warning you to avoid that activity for another week until the pain goes down. d. You may drive when you are no longer taking prescription pain medication, you can comfortably wear a seatbelt, and you can safely maneuver your car and apply brakes. e. Dennis Bast may have sexual intercourse when it is comfortable.  9. FOLLOW UP in our office a. Please call [facility] at [phone] to set up an appointment to see your surgeon in the office for a follow-up appointment approximately 2-3 weeks after your surgery. b. Make sure that you call for this appointment the day you arrive home to insure a convenient appointment time. 10. IF YOU HAVE DISABILITY OR FAMILY LEAVE FORMS, BRING THEM TO THE OFFICE FOR PROCESSING.  DO NOT GIVE THEM TO YOUR DOCTOR.   WHEN TO CALL us [phone]: 1. Poor pain control 2. Reactions / problems with new medications (rash/itching, nausea, etc)  3. Fever over 101.5 F (38.5 C) 4. Inability to urinate 5. Nausea and/or vomiting 6. Worsening swelling or bruising 7. Continued bleeding from incision. 8. Increased pain, redness, or drainage from the incision   The clinic staff is available to answer your questions during regular business hours (8:30am-5pm).  Please dont hesitate to call and ask to speak to one of our nurses for clinical concerns.   If you have a medical emergency, go to the nearest emergency room or call 911.  A surgeon from [facility] is always on call at the [facility], [place], [place], [place], [place]  [address] ? MAIN: [phone] ? TOLL FREE: [phone] ?  FAX [phone] [email or web address]  Colon Polyps Polyps are lumps of extra tissue growing inside the body. Polyps can grow in the large intestine (colon). Most colon polyps are noncancerous (benign). However, some colon polyps can become cancerous over time. Polyps that are larger than a pea may be  harmful. To be safe, caregivers remove and test all polyps. CAUSES  Polyps form when mutations in the genes cause your cells to grow and divide even though no more tissue is needed. RISK FACTORS There are a number of risk factors that can increase your chances of getting colon polyps. They include:  Being older than 50 years.  Family history of colon polyps or colon cancer.  Long-term colon diseases, such as colitis or Crohn disease.  Being overweight.  Smoking.  Being inactive.  Drinking too much alcohol. SYMPTOMS  Most small polyps do not cause symptoms. If symptoms are present, they may include:  Blood in the stool. The stool may look dark red or black.  Constipation or diarrhea that lasts longer than 1 week. DIAGNOSIS People often do not know they have polyps until their caregiver finds them during a regular checkup. Your caregiver can use 4 tests to check for polyps:  Digital rectal exam. The caregiver wears gloves and feels inside the rectum. This test would find polyps only in the rectum.  Barium enema. The caregiver puts a liquid called barium  into your rectum before taking X-rays of your colon. Barium makes your colon look white. Polyps are dark, so they are easy to see in the X-ray pictures.  Sigmoidoscopy. A thin, flexible tube (sigmoidoscope) is placed into your rectum. The sigmoidoscope has a light and tiny camera in it. The caregiver uses the sigmoidoscope to look at the last third of your colon.  Colonoscopy. This test is like sigmoidoscopy, but the caregiver looks at the entire colon. This is the most common method for finding and removing polyps. TREATMENT  Any polyps will be removed during a sigmoidoscopy or colonoscopy. The polyps are then tested for cancer. PREVENTION  To help lower your risk of getting more colon polyps:  Eat plenty of fruits and vegetables. Avoid eating fatty foods.  Do not smoke.  Avoid drinking alcohol.  Exercise every  day.  Lose weight if recommended by your caregiver.  Eat plenty of calcium and folate. Foods that are rich in calcium include milk, cheese, and broccoli. Foods that are rich in folate include chickpeas, kidney beans, and spinach. HOME CARE INSTRUCTIONS Keep all follow-up appointments as directed by your caregiver. You may need periodic exams to check for polyps. SEEK MEDICAL CARE IF: You notice bleeding during a bowel movement. Document Released: 06/09/2004 Document Revised: 12/06/2011 Document Reviewed: 11/23/2011 [facility] Patient Information 2015 [place], [place]. This information is not intended to replace advice given to you by your health care provider. Make sure you discuss any questions you have with your health care provider.   You are to follow up with coumadin clinic at [facility]: [phone]. As per pharmacist, Jorene Guest, you are to start lovenox and coumadin at the same time. You have five more doses of the lovenox. You will follow up with the coumadin clinic next Monday 03/18/14. If you have any bleeding from rectum, call [facility] at [phone] or go to the emergency room.

## 2014-03-15 NOTE — Transfer of Care (Signed)
Immediate Anesthesia Transfer of Care Note  Patient: Alicia Martinez  Procedure(s) Performed: Procedure(s): partial CECECTOMY (N/A) APPENDECTOMY LAPAROSCOPIC (N/A) LAPAROSCOPIC LYSIS OF ADHESIONS (N/A)  Patient Location: PACU  Anesthesia Type:General  Level of Consciousness: Patient easily awoken, sedated, comfortable, cooperative, following commands, responds to stimulation.   Airway & Oxygen Therapy: Patient spontaneously breathing, ventilating well, oxygen via simple oxygen mask.  Post-op Assessment: Report given to PACU RN, vital signs reviewed and stable, moving all extremities.   Post vital signs: Reviewed and stable.  Complications: No apparent anesthesia complications

## 2014-03-15 NOTE — Anesthesia Postprocedure Evaluation (Signed)
  Anesthesia Post-op Note  Patient: Alicia Martinez  Procedure(s) Performed: Procedure(s) (LRB): partial CECECTOMY (N/A) APPENDECTOMY LAPAROSCOPIC (N/A) LAPAROSCOPIC LYSIS OF ADHESIONS (N/A)  Patient Location: PACU  Anesthesia Type: General  Level of Consciousness: awake and alert   Airway and Oxygen Therapy: Patient Spontanous Breathing  Post-op Pain: mild  Post-op Assessment: Post-op Vital signs reviewed, Patient's Cardiovascular Status Stable, Respiratory Function Stable, Patent Airway and No signs of Nausea or vomiting  Last Vitals:  Filed Vitals:   03/15/14 1045  BP: 157/69  Pulse: 74  Temp:   Resp: 18    Post-op Vital Signs: stable   Complications: No apparent anesthesia complications

## 2014-03-18 ENCOUNTER — Encounter (HOSPITAL_COMMUNITY): Payer: Self-pay | Admitting: Surgery

## 2014-03-19 ENCOUNTER — Telehealth (INDEPENDENT_AMBULATORY_CARE_PROVIDER_SITE_OTHER): Payer: Self-pay

## 2014-03-19 NOTE — Telephone Encounter (Signed)
Message copied by Illene Regulus on Tue Mar 19, 2014  2:27 PM ------      Message from: Adin Hector      Created: Tue Mar 19, 2014  7:33 AM       Pathology shows benign results: Sessile serrated adenoma polyp.  NO CANCER.              Alisha, [facility] MA, please call on the patient to make sure recovery is going well & tell pt the good news on the pathology.  Thanks,            Adin Hector, M.D., F.A.C.S.      Gastrointestinal and Minimally Invasive Surgery      [facility]      [address] [place]      [place], [place] [address]      [phone] Main / Paging             ------

## 2014-03-19 NOTE — Telephone Encounter (Signed)
Called pt to give her the good news on the path showing it was a sessile serrated adenoma polyp per Dr Johney [place]. No Cancer per Dr Johney [place]. The pt has a f/u appt with Dr Johney [place] on 03/28/14.

## 2014-03-21 ENCOUNTER — Encounter (INDEPENDENT_AMBULATORY_CARE_PROVIDER_SITE_OTHER): Payer: Self-pay | Admitting: Surgery

## 2014-03-21 ENCOUNTER — Telehealth (INDEPENDENT_AMBULATORY_CARE_PROVIDER_SITE_OTHER): Payer: Self-pay

## 2014-03-21 ENCOUNTER — Ambulatory Visit (INDEPENDENT_AMBULATORY_CARE_PROVIDER_SITE_OTHER): Payer: BC Managed Care – PPO | Admitting: Surgery

## 2014-03-21 VITALS — BP 130/76 | HR 60 | Temp 98.8°F | Resp 18 | Ht 66.0 in | Wt 272.0 lb

## 2014-03-21 DIAGNOSIS — C20 Malignant neoplasm of rectum: Secondary | ICD-10-CM

## 2014-03-21 NOTE — Progress Notes (Addendum)
[place], MD,  [address]  [place], [place]    [place] Phone:  [phone] FAX:  [phone]   Re:   Alicia Martinez DOB:   1956-01-09 MRN:   [id number]  Urgent Office  ASSESSMENT AND PLAN: 1.  Partial CECECTOMY, APPENDECTOMY LAPAROSCOPIC, LAPAROSCOPIC LYSIS OF ADHESIONS x 4min - 03/15/2014 - S. Gross  She went home the same day.  She actually looks good.  I gave her Vicodin (5/325), #20.  She has an appt with Dr. Johney [place] on 7/2.  2.  History of DVT  Was on lovenox/coumadin - levels to be checked by Jorene Guest, pharmacy, with IM clinic 3.  Obese 4.  History of LAR for colon cancer by Dr. Johney [place] - 09/22/2009   HISTORY OF PRESENT ILLNESS: Chief Complaint  Patient presents with  . Abdominal Pain    Alicia Martinez is a 58 y.o. (DOB: 07/16/1956)  AA  female who is a patient of Michail Jewels, MD and comes to Urgent Office today for abdominal pain and fever. She has 2 granddaughters with her - Alicia Martinez and Alicia Martinez.  She said that she has some fullness in her upper abdomen, she had a "fever" of 100.1, and hurts.  She is taking liquids, she is not vomiting, and she is having bowel movements. She actually look okay to me.  Past Medical History  Diagnosis Date  . Deep vein thrombosis     left lower extremity  . Pulmonary embolism     LAST ONE WAS 1995- ON CHRONIC COUMADIN  . Allergy   . Anemia   . Hypertension   . History of rectal cancer     T1N0 resected 08/2009  . Diverticulitis large intestine   . Obesity   . Infectious colitis   . Sleep apnea 08/09/2012    Sleep study 02/2013 : Severe obstructive sleep apnea/hypopnea syndrome, AHI 88.7, desat to 84%. CPAP titration to 15 CWP, AHI 0 per hour. Medium ResMed Mirage Quattro full-face mask with heated humidifier.      . Pain     LOWER BACK AND RT KNEE AND BOTH SHOULDERS - LEFT SHOULDER PAIN WORSE--PT TOLD ARTHRITIS  . Arthritis     DJD, ARTHRITIS  .  Heart palpitations     HX OF PALPITATIONS AND SOME DIZZINESS - EVALUATED BY CARDIOLOGIST DR. ROSS - OFFICE NOTES IS EPIC 08/24/13 - "EVENT MONITOR SHOWED NO SIGNIFICANT ARRHYTHMIA"  . Carotid artery plaque 08-30-13    PER CAROTID DOPPLER STUDY    SOCIAL HISTORY: She has 2 granddaughters with her - Alicia Martinez and Alicia Martinez.  PHYSICAL EXAM: BP 130/76  Pulse 60  Temp(Src) 98.8 F (37.1 C)  Resp 18  Ht 5\' 6"  (1.676 m)  Wt 272 lb (123.378 kg)  BMI 43.92 kg/m2  General: AA F who is alert.  HEENT: Normal. Pupils equal.  Neck: Supple. No mass.  No thyroid mass.   Lungs: Clear to auscultation and symmetric breath sounds. Heart:  RRR. No murmur or rub. Abdomen: Soft. No mass. She has some bruising - more right abdomen.  Port sites all look good.  No obvious infection.  BS present.  DATA REVIEWED: Epic notes  Alphonsa Overall, MD,  [facility], [address]  [place], [place]    [place] Phone:  [phone] FAX:  [phone]

## 2014-03-21 NOTE — Telephone Encounter (Signed)
Pt called stating her pain is a level 7 with pain meds. Pt has been having temp 100.1 each night. Pt cannot tolerate any solids. Pt states when she eats or drinks anything it feels like it is not going down. Pt does not vomit. Pt is voiding and having BMs.  Pt given appt today to be evaluated in urg office.

## 2014-03-25 ENCOUNTER — Ambulatory Visit (INDEPENDENT_AMBULATORY_CARE_PROVIDER_SITE_OTHER): Payer: BC Managed Care – PPO | Admitting: Pharmacist

## 2014-03-25 DIAGNOSIS — Z7901 Long term (current) use of anticoagulants: Secondary | ICD-10-CM

## 2014-03-25 LAB — POCT INR: INR: 2.1

## 2014-03-25 NOTE — Progress Notes (Signed)
Anti-Coagulation Progress Note  Alicia Martinez is a 58 y.o. female who is currently on an anti-coagulation regimen.    RECENT RESULTS: Recent results are below, the most recent result is correlated with a dose of 37.5 mg. per week: Lab Results  Component Value Date   INR 2.10 03/25/2014   INR 1.11 03/15/2014   INR 2.21* 03/08/2014    ANTI-COAG DOSE: Anticoagulation Dose Instructions as of 03/25/2014     [name] Tue Wed Thu Fri Sat   New Dose 5 mg 7.5 mg 5 mg 7.5 mg 5 mg 7.5 mg 5 mg       ANTICOAG SUMMARY: Anticoagulation Episode Summary   Current INR goal 2.0-3.0  Next INR check 04/22/2014  INR from last check 2.10 (03/25/2014)  Weekly max dose   Target end date Indefinite  [facility] check location [facility]  Preferred lab   Send INR reminders to    Indications  DEEP VENOUS THROMBOPHLEBITIS RECURRENT (Resolved) [453.40] Long term current use of anticoagulant [V58.61]        Comments         ANTICOAG TODAY: Anticoagulation Summary as of 03/25/2014   INR goal 2.0-3.0  Selected INR 2.10 (03/25/2014)  Next INR check 04/22/2014  Target end date Indefinite   Indications  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  Patient instructed to take today's dose.  Patient verbalized understanding of these instructions.       FOLLOW-UP Return in 4 weeks (on 04/22/2014) for Follow up INR at [place], [name] Pharm.D., CACP

## 2014-03-25 NOTE — Patient Instructions (Signed)
Patient instructed to take medications as defined in the Anti-coagulation Track section of this encounter.  Patient instructed to take today's dose.  Patient verbalized understanding of these instructions.    

## 2014-03-28 ENCOUNTER — Ambulatory Visit (INDEPENDENT_AMBULATORY_CARE_PROVIDER_SITE_OTHER): Payer: BC Managed Care – PPO | Admitting: Surgery

## 2014-03-28 ENCOUNTER — Encounter (INDEPENDENT_AMBULATORY_CARE_PROVIDER_SITE_OTHER): Payer: Self-pay | Admitting: Surgery

## 2014-03-28 ENCOUNTER — Encounter (INDEPENDENT_AMBULATORY_CARE_PROVIDER_SITE_OTHER): Payer: Self-pay

## 2014-03-28 VITALS — BP 136/82 | HR 74 | Resp 16 | Ht 66.0 in | Wt 275.0 lb

## 2014-03-28 DIAGNOSIS — D126 Benign neoplasm of colon, unspecified: Secondary | ICD-10-CM

## 2014-03-28 NOTE — Patient Instructions (Signed)
ABDOMINAL SURGERY: POST OP INSTRUCTIONS  1. DIET: Follow a light bland diet the first 24 hours after arrival home, such as soup, liquids, crackers, etc.  Be sure to include lots of fluids daily.  Avoid fast food or heavy meals as your are more likely to get nauseated.  Eat a low fat the next few days after surgery.   2. Take your usually prescribed home medications unless otherwise directed. 3. PAIN CONTROL: a. Pain is best controlled by a usual combination of three different methods TOGETHER: i. Ice/Heat ii. Over the counter pain medication iii. Prescription pain medication b. Most patients will experience some swelling and bruising around the incisions.  Ice packs or heating pads (30-60 minutes up to 6 times a day) will help. Use ice for the first few days to help decrease swelling and bruising, then switch to heat to help relax tight/sore spots and speed recovery.  Some people prefer to use ice alone, heat alone, alternating between ice & heat.  Experiment to what works for you.  Swelling and bruising can take several weeks to resolve.   c. It is helpful to take an over-the-counter pain medication regularly for the first few weeks.  Choose one of the following that works best for you: i. Naproxen (Aleve, etc)  Two 262m tabs twice a day ii. Ibuprofen (Advil, etc) Three 2066mtabs four times a day (every meal & bedtime) iii. Acetaminophen (Tylenol, etc) 500-65011mour times a day (every meal & bedtime) d. A  prescription for pain medication (such as oxycodone, hydrocodone, etc) should be given to you upon discharge.  Take your pain medication as prescribed.  i. If you are having problems/concerns with the prescription medicine (does not control pain, nausea, vomiting, rash, itching, etc), please call us [phone] see if we need to switch you to a different pain medicine that will work better for you and/or control your side effect better. ii. If you need a refill on your pain medication,  please contact your pharmacy.  They will contact our office to request authorization. Prescriptions will not be filled after 5 pm or on week-ends. 4. Avoid getting constipated.  Between the surgery and the pain medications, it is common to experience some constipation.  Increasing fluid intake and taking a fiber supplement (such as Metamucil, Citrucel, FiberCon, MiraLax, etc) 1-2 times a day regularly will usually help prevent this problem from occurring.  A mild laxative (prune juice, Milk of Magnesia, MiraLax, etc) should be taken according to package directions if there are no bowel movements after 48 hours.   5. Watch out for diarrhea.  If you have many loose bowel movements, simplify your diet to bland foods & liquids for a few days.  Stop any stool softeners and decrease your fiber supplement.  Switching to mild anti-diarrheal medications (Kayopectate, Pepto Bismol) can help.  If this worsens or does not improve, please call us.[place]. Wash / shower every day.  You may shower over the incision / wound.  Avoid baths until the skin is fully healed.  Continue to shower over incision(s) after the dressing is off. 7. Remove your waterproof bandages 5 days after surgery.  You may leave the incision open to air.  You may replace a dressing/Band-Aid to cover the incision for comfort if you wish. 8. ACTIVITIES as tolerated:   a. You may resume regular (light) daily activities beginning the next day-such as daily self-care, walking, climbing stairs-gradually increasing activities as tolerated.  If you can  walk 30 minutes without difficulty, it is safe to try more intense activity such as jogging, treadmill, bicycling, low-impact aerobics, swimming, etc. b. Save the most intensive and strenuous activity for last such as sit-ups, heavy lifting, contact sports, etc  Refrain from any heavy lifting or straining until you are off narcotics for pain control.   c. DO NOT PUSH THROUGH PAIN.  Let pain be your guide: If it  hurts to do something, don't do it.  Pain is your body warning you to avoid that activity for another week until the pain goes down. d. You may drive when you are no longer taking prescription pain medication, you can comfortably wear a seatbelt, and you can safely maneuver your car and apply brakes. e. Dennis Bast may have sexual intercourse when it is comfortable.  9. FOLLOW UP in our office a. Please call [facility] at [phone] to set up an appointment to see your surgeon in the office for a follow-up appointment approximately 1-2 weeks after your surgery. b. Make sure that you call for this appointment the day you arrive home to insure a convenient appointment time. 10. IF YOU HAVE DISABILITY OR FAMILY LEAVE FORMS, BRING THEM TO THE OFFICE FOR PROCESSING.  DO NOT GIVE THEM TO YOUR DOCTOR.   WHEN TO CALL us [phone]: 1. Poor pain control 2. Reactions / problems with new medications (rash/itching, nausea, etc)  3. Fever over 101.5 F (38.5 C) 4. Inability to urinate 5. Nausea and/or vomiting 6. Worsening swelling or bruising 7. Continued bleeding from incision. 8. Increased pain, redness, or drainage from the incision  The clinic staff is available to answer your questions during regular business hours (8:30am-5pm).  Please don't hesitate to call and ask to speak to one of our nurses for clinical concerns.   A surgeon from [facility] is always on call at the hospitals   If you have a medical emergency, go to the nearest emergency room or call 911.    [facility], [place], [place], [place], [place]  [address] ? MAIN: [phone] ? TOLL FREE: [phone] ? FAX [phone] [email or web address]   GETTING TO GOOD BOWEL HEALTH. Irregular bowel habits such as constipation and diarrhea can lead to many problems over time.  Having one soft bowel movement a day is the most important way to prevent further problems.  The anorectal canal  is designed to handle stretching and feces to safely manage our ability to get rid of solid waste (feces, poop, stool) out of our body.  BUT, hard constipated stools can act like ripping concrete bricks and diarrhea can be a burning fire to this very sensitive area of our body, causing inflamed hemorrhoids, anal fissures, increasing risk is perirectal abscesses, abdominal pain/bloating, an making irritable bowel worse.     The goal: ONE SOFT BOWEL MOVEMENT A DAY!  To have soft, regular bowel movements:    Drink at least 8 tall glasses of water a day.     Take plenty of fiber.  Fiber is the undigested part of plant food that passes into the colon, acting s "natures broom" to encourage bowel motility and movement.  Fiber can absorb and hold large amounts of water. This results in a larger, bulkier stool, which is soft and easier to pass. Work gradually over several weeks up to 6 servings a day of fiber (25g a day even more if needed) in the form of: o Vegetables -- Root (potatoes, carrots,  turnips), leafy green (lettuce, salad greens, celery, spinach), or cooked high residue (cabbage, broccoli, etc) o Fruit -- Fresh (unpeeled skin & pulp), Dried (prunes, apricots, cherries, etc ),  or stewed ( applesauce)  o Whole grain breads, pasta, etc (whole wheat)  o Bran cereals    Bulking Agents -- This type of water-retaining fiber generally is easily obtained each day by one of the following:  o Psyllium bran -- The psyllium plant is remarkable because its ground seeds can retain so much water. This product is available as Metamucil, Konsyl, Effersyllium, Per Diem Fiber, or the less expensive generic preparation in drug and health food stores. Although labeled a laxative, it really is not a laxative.  o Methylcellulose -- This is another fiber derived from wood which also retains water. It is available as Citrucel. o Polyethylene Glycol - and "artificial" fiber commonly called Miralax or Glycolax.  It is helpful  for people with gassy or bloated feelings with regular fiber o Flax Seed - a less gassy fiber than psyllium   No reading or other relaxing activity while on the toilet. If bowel movements take longer than 5 minutes, you are too constipated   AVOID CONSTIPATION.  High fiber and water intake usually takes care of this.  Sometimes a laxative is needed to stimulate more frequent bowel movements, but    Laxatives are not a good long-term solution as it can wear the colon out. o Osmotics (Milk of Magnesia, Fleets phosphosoda, Magnesium citrate, MiraLax, GoLytely) are safer than  o Stimulants (Senokot, Castor Oil, Dulcolax, Ex Lax)    o Do not take laxatives for more than 7days in a row.    IF SEVERELY CONSTIPATED, try a Bowel Retraining Program: o Do not use laxatives.  o Eat a diet high in roughage, such as bran cereals and leafy vegetables.  o Drink six (6) ounces of prune or apricot juice each morning.  o Eat two (2) large servings of stewed fruit each day.  o Take one (1) heaping tablespoon of a psyllium-based bulking agent twice a day. Use sugar-free sweetener when possible to avoid excessive calories.  o Eat a normal breakfast.  o Set aside 15 minutes after breakfast to sit on the toilet, but do not strain to have a bowel movement.  o If you do not have a bowel movement by the third day, use an enema and repeat the above steps.    Controlling diarrhea o Switch to liquids and simpler foods for a few days to avoid stressing your intestines further. o Avoid dairy products (especially milk & ice cream) for a short time.  The intestines often can lose the ability to digest lactose when stressed. o Avoid foods that cause gassiness or bloating.  Typical foods include beans and other legumes, cabbage, broccoli, and dairy foods.  Every person has some sensitivity to other foods, so listen to our body and avoid those foods that trigger problems for you. o Adding fiber (Citrucel, Metamucil, psyllium,  Miralax) gradually can help thicken stools by absorbing excess fluid and retrain the intestines to act more normally.  Slowly increase the dose over a few weeks.  Too much fiber too soon can backfire and cause cramping & bloating. o Probiotics (such as active yogurt, Align, etc) may help repopulate the intestines and colon with normal bacteria and calm down a sensitive digestive tract.  Most studies show it to be of mild help, though, and such products can be costly. o Medicines:  Bismuth subsalicylate (ex. Kayopectate, Pepto Bismol) every 30 minutes for up to 6 doses can help control diarrhea.  Avoid if pregnant.   Loperamide (Immodium) can slow down diarrhea.  Start with two tablets (4mg  total) first and then try one tablet every 6 hours.  Avoid if you are having fevers or severe pain.  If you are not better or start feeling worse, stop all medicines and call your doctor for advice o Call your doctor if you are getting worse or not better.  Sometimes further testing (cultures, endoscopy, X-ray studies, bloodwork, etc) may be needed to help diagnose and treat the cause of the diarrhea. o

## 2014-03-28 NOTE — Progress Notes (Signed)
Subjective:     Patient ID: Alicia Martinez, female   DOB: November 07, 1955, 58 y.o.   MRN: [id number]  HPI  Note: This dictation was prepared with Dragon/digital dictation along with [facility] technology. Any transcriptional errors that result from this process are unintentional.       Alicia Martinez  14-Aug-1956 [id number]  Patient Care Team: Jones Bales, MD as PCP - General (Internal Medicine) Milta Deiters, MD as Referring Physician (Internal Medicine) Fay Records, MD as Consulting Physician (Cardiology) Gatha Mayer, MD as Consulting Physician (Gastroenterology) Adin Hector, MD as Consulting Physician (General Surgery) Alphonsa Gin [name], [facility] as Pharmacist (Pharmacist)  Procedure (Date: 03/15/2014):   POST-OPERATIVE DIAGNOSIS: sessile serrated adenona of appendiceal orifice  PROCEDURE: Procedure(s):  partial CECECTOMY  APPENDECTOMY LAPAROSCOPIC  LAPAROSCOPIC LYSIS OF ADHESIONS x22min  SURGEON: Surgeon(s):  Adin Hector, MD  Diagnosis Appendix, Other than Incidental, polyp of appendiceal orifice - SESSILE SERRATED POLYP. - NO DYSPLASIA OR MALIGNANCY. - RESECTION MARGINS NEGATIVE FOR DYSPLASIA OR MALIGNANCY. Alicia Males MD Pathologist, Electronic Signature  This patient returns for surgical re-evaluation.  She comes in with her husband.  She was concerned about some low-grade fevers & crampy abdominal pain.  Seen in our office last week.  Reassured.  She is feeling much better now.  Eating well.  Pain faded away.  Not needing narcotics.  Therapeutic on her INR.  Lovenox stopped.  No fevers or chills.  In good spirits.  Hoping to get back to work soon.  Patient Active Problem List   Diagnosis Date Noted  . Rotator cuff syndrome 02/22/2014  . 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 DVT, lower extremity, recurrent 01/08/2014  . Other pulmonary embolism and infarction 01/08/2014  . Right knee pain 12/31/2013    . Palpitations 05/08/2013  . Preventative health care 10/31/2012  . Sleep apnea 08/09/2012  . Adnexal mass 01/18/2011  . Long term current use of anticoagulant 10/17/2010  . BACK PAIN 12/01/2009  . ADENOCARCINOMA, RECTUM 08/06/2009  . PERS HX MAL NEOPLSM RECT RECTOSIGMOID JUNC&ANUS 08/06/2009  . Obesity, Class [name], BMI 40-49.9 (morbid obesity) 02/27/2009  . TOBACCO ABUSE 02/27/2009  . HYPERTENSION 02/27/2009    Past Medical History  Diagnosis Date  . Deep vein thrombosis     left lower extremity  . Pulmonary embolism     LAST ONE WAS 1995- ON CHRONIC COUMADIN  . Allergy   . Anemia   . Hypertension   . History of rectal cancer     T1N0 resected 08/2009  . Diverticulitis large intestine   . Obesity   . Infectious colitis   . Sleep apnea 08/09/2012    Sleep study 02/2013 : Severe obstructive sleep apnea/hypopnea syndrome, AHI 88.7, desat to 84%. CPAP titration to 15 CWP, AHI 0 per hour. Medium ResMed Mirage Quattro full-face mask with heated humidifier.      . Pain     LOWER BACK AND RT KNEE AND BOTH SHOULDERS - LEFT SHOULDER PAIN WORSE--PT TOLD ARTHRITIS  . Arthritis     DJD, ARTHRITIS  . Heart palpitations     HX OF PALPITATIONS AND SOME DIZZINESS - EVALUATED BY CARDIOLOGIST DR. ROSS - OFFICE NOTES IS EPIC 08/24/13 - "EVENT MONITOR SHOWED NO SIGNIFICANT ARRHYTHMIA"  . Carotid artery plaque 08-30-13    PER CAROTID DOPPLER STUDY    Past Surgical History  Procedure Laterality Date  . Abdominal hysterectomy    . Tubal ligation    .  Transanal rectal resection  08/2009    T1N0 rectal cancer Dr. Johney Maine  . Colonoscopy    . Cecostomy N/A 03/15/2014    Procedure: partial CECECTOMY;  Surgeon: Adin Hector, MD;  Location: [facility];  Service: General;  Laterality: N/A;  . Laparoscopic appendectomy N/A 03/15/2014    Procedure: APPENDECTOMY LAPAROSCOPIC;  Surgeon: Adin Hector, MD;  Location: [facility];  Service: General;  Laterality: N/A;  . Laparoscopic lysis of adhesions N/A  03/15/2014    Procedure: LAPAROSCOPIC LYSIS OF ADHESIONS;  Surgeon: Adin Hector, MD;  Location: [facility];  Service: General;  Laterality: N/A;    History   Social History  . Marital Status: Single    Spouse Name: N/A    Number of Children: N/A  . 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 Smoking status: Current Some Day Smoker -- 0.25 packs/day for 40 years    Types: Cigarettes  . Smokeless tobacco: Never Used  . Alcohol Use: No  . Drug Use: No  . Sexual Activity: Not on file   Other Topics Concern  . Not on file   Social History Narrative   Patient does not regular exercise. He has three boys. Daily caffeine use 2/day.    Family History  Problem Relation Age of Onset  . Cancer Mother     unknown type but patient thinks it was bone marrow cancer  . Heart disease Sister   . Heart disease Brother   . Clotting disorder Brother   . Colon cancer Neg Hx   . Stomach cancer Neg Hx     Current Outpatient Prescriptions  Medication Sig Dispense Refill  . aspirin 81 MG tablet Take 81 mg by mouth daily.      . capsicum (MUSCLE RELIEF) 0.075 % topical cream Apply 1 application topically 2 (two) times daily as needed (muscle pain).      Marland Kitchen diphenhydrAMINE (BENADRYL) 50 MG tablet Take 0.5 tablets (25 mg total) by mouth every 8 (eight) hours as needed for itching.  30 tablet  0  . enoxaparin (LOVENOX) 120 MG/0.8ML injection Inject 120 mg into the skin 2 (two) times daily.      . hydrochlorothiazide (HYDRODIURIL) 25 MG tablet Take 12.5 mg by mouth every morning.      . Magnesium Hydroxide (MILK OF MAGNESIA PO) Take by mouth. PRN      . Menthol, Topical Analgesic, (ICY HOT BACK) 5 % PADS Apply 1 patch topically daily as needed (Muscle pain).      Marland Kitchen oxycodone (OXY-IR) 5 MG capsule Take 1-2 capsules (5-10 mg total) by mouth every 4 (four) hours as needed. FOR JOINT PAIN  50 capsule  0  . Psyllium (METAMUCIL PO) Take by mouth. ONCE  A DAY      . tetrahydrozoline 0.05 % ophthalmic solution Place 1 drop into both eyes as needed (allergies/red eyes). VISINE      . warfarin (COUMADIN) 5 MG tablet Take 5 mg by mouth every morning.       No current facility-administered medications for this visit.     Allergies  Allergen Reactions  . Other     SOME ADHESIVES CAUSE SKIN IRRITATION  - STATES PAPER TAPE OK.  STATES SOME EKG ELECTRODES AND SOME SKIN PATCHES ALSO CAUSE IRRITATION    There were no vitals taken for this visit.  Dg Chest 2 View  03/08/2014   CLINICAL DATA:  Preop appendectomy. Hypertension. Smoker. History of pulmonary emboli.  EXAM: CHEST  2 VIEW  COMPARISON:  07/17/2012  FINDINGS: The heart size and mediastinal contours are within normal limits. Both lungs are clear. The visualized skeletal structures are unremarkable.  IMPRESSION: No active cardiopulmonary disease.   Electronically Signed   By: Rolm Baptise M.D.   On: 03/08/2014 15:46     Review of Systems  Constitutional: Negative for chills, diaphoresis, appetite change and fatigue.  HENT: Negative for ear pain, sore throat and trouble swallowing.   Eyes: Negative for photophobia and visual disturbance.  Respiratory: Negative for cough and choking.   Cardiovascular: Negative for chest pain and palpitations.  Gastrointestinal: Negative for nausea, vomiting, abdominal pain, diarrhea, constipation, blood in stool, anal bleeding and rectal pain.  Genitourinary: Negative for dysuria, frequency and difficulty urinating.  Musculoskeletal: Negative for gait problem and myalgias.  Skin: Negative for color change, pallor and rash.  Neurological: Negative for dizziness, speech difficulty, weakness and numbness.  Hematological: Negative for adenopathy.  Psychiatric/Behavioral: Negative for confusion and agitation. The patient is not nervous/anxious.        Objective:   Physical Exam  Constitutional: She is oriented to person, place, and time. She appears  well-developed and well-nourished. No distress.  HENT:  Head: Normocephalic.  Mouth/Throat: Oropharynx is clear and moist. No oropharyngeal exudate.  Eyes: Conjunctivae and EOM are normal. Pupils are equal, round, and reactive to light. No scleral icterus.  Neck: Normal range of motion. Neck supple. No tracheal deviation present.  Cardiovascular: Normal rate, regular rhythm and intact distal pulses.   Pulmonary/Chest: Effort normal and breath sounds normal. No stridor. No respiratory distress. She exhibits no tenderness.  Abdominal: Soft. She exhibits no distension and no mass. There is no tenderness. No hernia. Hernia confirmed negative in the ventral area, confirmed negative in the right inguinal area and confirmed negative in the left inguinal area.    Incisions clean with normal healing ridges.  No hernias  Genitourinary: No vaginal discharge found.  Musculoskeletal: Normal range of motion. She exhibits no tenderness.       Right elbow: She exhibits normal range of motion.       Left elbow: She exhibits normal range of motion.       Right wrist: She exhibits normal range of motion.       Left wrist: She exhibits normal range of motion.       Right hand: Normal strength noted.       Left hand: Normal strength noted.  Lymphadenopathy:       Head (right side): No posterior auricular adenopathy present.       Head (left side): No posterior auricular adenopathy present.    She has no cervical adenopathy.    She has no axillary adenopathy.       Right: No inguinal adenopathy present.       Left: No inguinal adenopathy present.  Neurological: She is alert and oriented to person, place, and time. No cranial nerve deficit. She exhibits normal muscle tone. Coordination normal.  Skin: Skin is warm and dry. No rash noted. She is not diaphoretic. No erythema.  Psychiatric: She has a normal mood and affect. Her behavior is normal. Judgment and thought content normal.       Assessment:       Recovered relatively well only 2 weeks out status post partial cecectomy and appendectomy for sessile serrated adenoma at appendiceal orifice with negative margins.     Plan:  Increase activity as tolerated to regular activity.  Low impact exercise such as walking an hour a day at least ideal.  Do not push through pain.  It is okay to return to work next week since she tells me her job will be light duty next week.  Diet as tolerated.  Low fat high fiber diet ideal.  Bowel regimen with 30 g fiber a day and fiber supplement as needed to avoid problems.  Return to clinic in 3 weeks, sooner as needed.   Instructions discussed.  Followup with primary care physician for other health issues as would normally be done.  Consider screening for malignancies (breast, prostate, colon, melanoma, etc) as appropriate.  Questions answered.  The patient expressed understanding and appreciation

## 2014-04-03 ENCOUNTER — Other Ambulatory Visit: Payer: Self-pay | Admitting: Internal Medicine

## 2014-04-03 NOTE — Telephone Encounter (Signed)
Opened in error

## 2014-04-03 NOTE — Telephone Encounter (Deleted)
Opened in error

## 2014-04-22 ENCOUNTER — Ambulatory Visit (INDEPENDENT_AMBULATORY_CARE_PROVIDER_SITE_OTHER): Payer: BC Managed Care – PPO | Admitting: Surgery

## 2014-04-22 ENCOUNTER — Encounter: Payer: Self-pay | Admitting: Internal Medicine

## 2014-04-22 ENCOUNTER — Encounter (INDEPENDENT_AMBULATORY_CARE_PROVIDER_SITE_OTHER): Payer: Self-pay | Admitting: Surgery

## 2014-04-22 ENCOUNTER — Ambulatory Visit: Payer: BC Managed Care – PPO

## 2014-04-22 VITALS — BP 134/80 | HR 82 | Resp 18 | Ht 66.0 in | Wt 278.0 lb

## 2014-04-22 DIAGNOSIS — D126 Benign neoplasm of colon, unspecified: Secondary | ICD-10-CM

## 2014-04-22 DIAGNOSIS — C20 Malignant neoplasm of rectum: Secondary | ICD-10-CM

## 2014-04-22 NOTE — Patient Instructions (Signed)
Please consider the recommendations that we have given you today:  Followup colonoscopy for polyps per your gastroenterologist (probably 3 years)  See the Handout(s) we have given you.  Please call our office at [phone] if you wish to schedule surgery or if you have further questions / concerns.    Colon Polyps Polyps are lumps of extra tissue growing inside the body. Polyps can grow in the large intestine (colon). Most colon polyps are noncancerous (benign). However, some colon polyps can become cancerous over time. Polyps that are larger than a pea may be harmful. To be safe, caregivers remove and test all polyps. CAUSES  Polyps form when mutations in the genes cause your cells to grow and divide even though no more tissue is needed. RISK FACTORS There are a number of risk factors that can increase your chances of getting colon polyps. They include:  Being older than 50 years.  Family history of colon polyps or colon cancer.  Long-term colon diseases, such as colitis or Crohn disease.  Being overweight.  Smoking.  Being inactive.  Drinking too much alcohol. SYMPTOMS  Most small polyps do not cause symptoms. If symptoms are present, they may include:  Blood in the stool. The stool may look dark red or black.  Constipation or diarrhea that lasts longer than 1 week. DIAGNOSIS People often do not know they have polyps until their caregiver finds them during a regular checkup. Your caregiver can use 4 tests to check for polyps:  Digital rectal exam. The caregiver wears gloves and feels inside the rectum. This test would find polyps only in the rectum.  Barium enema. The caregiver puts a liquid called barium into your rectum before taking X-rays of your colon. Barium makes your colon look white. Polyps are dark, so they are easy to see in the X-ray pictures.  Sigmoidoscopy. A thin, flexible tube (sigmoidoscope) is placed into your rectum. The sigmoidoscope has a light and  tiny camera in it. The caregiver uses the sigmoidoscope to look at the last third of your colon.  Colonoscopy. This test is like sigmoidoscopy, but the caregiver looks at the entire colon. This is the most common method for finding and removing polyps. TREATMENT  Any polyps will be removed during a sigmoidoscopy or colonoscopy. The polyps are then tested for cancer. PREVENTION  To help lower your risk of getting more colon polyps:  Eat plenty of fruits and vegetables. Avoid eating fatty foods.  Do not smoke.  Avoid drinking alcohol.  Exercise every day.  Lose weight if recommended by your caregiver.  Eat plenty of calcium and folate. Foods that are rich in calcium include milk, cheese, and broccoli. Foods that are rich in folate include chickpeas, kidney beans, and spinach. HOME CARE INSTRUCTIONS Keep all follow-up appointments as directed by your caregiver. You may need periodic exams to check for polyps. SEEK MEDICAL CARE IF: You notice bleeding during a bowel movement. Document Released: 06/09/2004 Document Revised: 12/06/2011 Document Reviewed: 11/23/2011 [facility] Patient Information 2015 [place], [place]. This information is not intended to replace advice given to you by your health care provider. Make sure you discuss any questions you have with your health care provider.

## 2014-04-22 NOTE — Progress Notes (Signed)
Subjective:     Patient ID: Alicia Martinez, female   DOB: 1956/04/11, 58 y.o.   MRN: [id number]  HPI   Note: This dictation was prepared with Dragon/digital dictation along with [facility] technology. Any transcriptional errors that result from this process are unintentional.       SUI KASPAREK  [birth year] [id number]  Patient Care Team: Jones Bales, MD as PCP - General (Internal Medicine) Milta Deiters, MD as Referring Physician (Internal Medicine) Fay Records, MD as Consulting Physician (Cardiology) Gatha Mayer, MD as Consulting Physician (Gastroenterology) Adin Hector, MD as Consulting Physician (General Surgery) Alphonsa Gin [name], [facility] as Pharmacist (Pharmacist)  Procedure (Date: 03/15/2014):  POST-OPERATIVE DIAGNOSIS: sessile serrated adenona of appendiceal orifice  PROCEDURE: Procedure(s):  partial CECECTOMY  APPENDECTOMY LAPAROSCOPIC  LAPAROSCOPIC LYSIS OF ADHESIONS x52min  SURGEON: Surgeon(s):  Adin Hector, MD  Diagnosis Appendix, Other than Incidental, polyp of appendiceal orifice - SESSILE SERRATED POLYP. - NO DYSPLASIA OR MALIGNANCY. - RESECTION MARGINS NEGATIVE FOR DYSPLASIA OR MALIGNANCY. Alicia Males MD Pathologist, Electronic Signature  This patient returns for surgical re-evaluation.  She comes in with her husband.  She is feeling much better now.  Eating well.  Pain faded away.  Not needing narcotics.  Therapeutic on her INR.  Lovenox stopped.  No fevers or chills.  In good spirits.  Healing well.  Appetite to good from her standpoint.  No problems at work.  Patient Active Problem List   Diagnosis Date Noted  . Rotator cuff syndrome 02/22/2014  . 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 DVT, lower extremity, recurrent 01/08/2014  . Other pulmonary embolism and infarction 01/08/2014  . Right knee pain 12/31/2013  . Palpitations 05/08/2013  . Preventative health care 10/31/2012  .  Sleep apnea 08/09/2012  . Adnexal mass 01/18/2011  . Long term current use of anticoagulant 10/17/2010  . BACK PAIN 12/01/2009  . ADENOCARCINOMA, RECTUM 08/06/2009  . PERS HX MAL NEOPLSM RECT RECTOSIGMOID JUNC&ANUS 08/06/2009  . Obesity, Class [name], BMI 40-49.9 (morbid obesity) 02/27/2009  . TOBACCO ABUSE 02/27/2009  . HYPERTENSION 02/27/2009    Past Medical History  Diagnosis Date  . Deep vein thrombosis     left lower extremity  . Pulmonary embolism     LAST ONE WAS 1995- ON CHRONIC COUMADIN  . Allergy   . Anemia   . Hypertension   . History of rectal cancer     T1N0 resected 08/2009  . Diverticulitis large intestine   . Obesity   . Infectious colitis   . Sleep apnea 08/09/2012    Sleep study 02/2013 : Severe obstructive sleep apnea/hypopnea syndrome, AHI 88.7, desat to 84%. CPAP titration to 15 CWP, AHI 0 per hour. Medium ResMed Mirage Quattro full-face mask with heated humidifier.      . Pain     LOWER BACK AND RT KNEE AND BOTH SHOULDERS - LEFT SHOULDER PAIN WORSE--PT TOLD ARTHRITIS  . Arthritis     DJD, ARTHRITIS  . Heart palpitations     HX OF PALPITATIONS AND SOME DIZZINESS - EVALUATED BY CARDIOLOGIST DR. ROSS - OFFICE NOTES IS EPIC 08/24/13 - "EVENT MONITOR SHOWED NO SIGNIFICANT ARRHYTHMIA"  . Carotid artery plaque 08-30-13    PER CAROTID DOPPLER STUDY    Past Surgical History  Procedure Laterality Date  . Abdominal hysterectomy    . Tubal ligation    . Transanal rectal resection  08/2009    T1N0 rectal cancer Dr. Johney [place]  .  Colonoscopy    . Cecostomy N/A 03/15/2014    Procedure: partial CECECTOMY;  Surgeon: Adin Hector, MD;  Location: [facility];  Service: General;  Laterality: N/A;  . Laparoscopic appendectomy N/A 03/15/2014    Procedure: APPENDECTOMY LAPAROSCOPIC;  Surgeon: Adin Hector, MD;  Location: [facility];  Service: General;  Laterality: N/A;  . Laparoscopic lysis of adhesions N/A 03/15/2014    Procedure: LAPAROSCOPIC LYSIS OF ADHESIONS;  Surgeon: Adin Hector, MD;  Location: [facility];  Service: General;  Laterality: N/A;    History   Social History  . Marital Status: Single    Spouse Name: N/A    Number of Children: N/A  . 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 Smoking status: Current Some Day Smoker -- 0.25 packs/day for 40 years    Types: Cigarettes  . Smokeless tobacco: Never Used  . Alcohol Use: No  . Drug Use: No  . Sexual Activity: Not on file   Other Topics Concern  . Not on file   Social History Narrative   Patient does not regular exercise. He has three boys. Daily caffeine use 2/day.    Family History  Problem Relation Age of Onset  . Cancer Mother     unknown type but patient thinks it was bone marrow cancer  . Heart disease Sister   . Heart disease Brother   . Clotting disorder Brother   . Colon cancer Neg Hx   . Stomach cancer Neg Hx     Current Outpatient Prescriptions  Medication Sig Dispense Refill  . aspirin 81 MG tablet Take 81 mg by mouth daily.      . capsicum (MUSCLE RELIEF) 0.075 % topical cream Apply 1 application topically 2 (two) times daily as needed (muscle pain).      Marland Kitchen diphenhydrAMINE (BENADRYL) 50 MG tablet Take 0.5 tablets (25 mg total) by mouth every 8 (eight) hours as needed for itching.  30 tablet  0  . hydrochlorothiazide (HYDRODIURIL) 25 MG tablet Take 12.5 mg by mouth every morning.      . Magnesium Hydroxide (MILK OF MAGNESIA PO) Take by mouth. PRN      . Menthol, Topical Analgesic, (ICY HOT BACK) 5 % PADS Apply 1 patch topically daily as needed (Muscle pain).      . Psyllium (METAMUCIL PO) Take by mouth. ONCE A DAY      . tetrahydrozoline 0.05 % ophthalmic solution Place 1 drop into both eyes as needed (allergies/red eyes). VISINE      . warfarin (COUMADIN) 5 MG tablet Take 5 mg by mouth every morning.       No current facility-administered medications for this visit.     Allergies  Allergen Reactions  . Other      SOME ADHESIVES CAUSE SKIN IRRITATION  - STATES PAPER TAPE OK.  STATES SOME EKG ELECTRODES AND SOME SKIN PATCHES ALSO CAUSE IRRITATION    BP 134/80  Pulse 82  Resp 18  Ht 5\' 6"  (1.676 m)  Wt 278 lb (126.1 kg)  BMI 44.89 kg/m2  Dg Chest 2 View  03/08/2014   CLINICAL DATA:  Preop appendectomy. Hypertension. Smoker. History of pulmonary emboli.  EXAM: CHEST  2 VIEW  COMPARISON:  07/17/2012  FINDINGS: The heart size and mediastinal contours are within normal limits. Both lungs are clear. The visualized skeletal structures are unremarkable.  IMPRESSION: No active cardiopulmonary disease.   Electronically  Signed   By: Rolm Baptise M.D.   On: 03/08/2014 15:46     Review of Systems  Constitutional: Negative for chills, diaphoresis, appetite change and fatigue.  HENT: Negative for ear pain, sore throat and trouble swallowing.   Eyes: Negative for photophobia and visual disturbance.  Respiratory: Negative for cough and choking.   Cardiovascular: Negative for chest pain and palpitations.  Gastrointestinal: Negative for nausea, vomiting, abdominal pain, diarrhea, constipation, blood in stool, anal bleeding and rectal pain.  Genitourinary: Negative for dysuria, frequency and difficulty urinating.  Musculoskeletal: Negative for gait problem and myalgias.  Skin: Negative for color change, pallor and rash.  Neurological: Negative for dizziness, speech difficulty, weakness and numbness.  Hematological: Negative for adenopathy.  Psychiatric/Behavioral: Negative for confusion and agitation. The patient is not nervous/anxious.        Objective:   Physical Exam  Constitutional: She is oriented to person, place, and time. She appears well-developed and well-nourished. No distress.  HENT:  Head: Normocephalic.  Mouth/Throat: Oropharynx is clear and moist. No oropharyngeal exudate.  Eyes: Conjunctivae and EOM are normal. Pupils are equal, round, and reactive to light. No scleral icterus.  Neck: Normal  range of motion. Neck supple. No tracheal deviation present.  Cardiovascular: Normal rate, regular rhythm and intact distal pulses.   Pulmonary/Chest: Effort normal and breath sounds normal. No stridor. No respiratory distress. She exhibits no tenderness.  Abdominal: Soft. She exhibits no distension and no mass. There is no tenderness. No hernia. Hernia confirmed negative in the ventral area, confirmed negative in the right inguinal area and confirmed negative in the left inguinal area.    Incisions clean with normal healing ridges.  No hernias  Genitourinary: No vaginal discharge found.  Musculoskeletal: Normal range of motion. She exhibits no tenderness.       Right elbow: She exhibits normal range of motion.       Left elbow: She exhibits normal range of motion.       Right wrist: She exhibits normal range of motion.       Left wrist: She exhibits normal range of motion.       Right hand: Normal strength noted.       Left hand: Normal strength noted.  Lymphadenopathy:       Head (right side): No posterior auricular adenopathy present.       Head (left side): No posterior auricular adenopathy present.    She has no cervical adenopathy.    She has no axillary adenopathy.       Right: No inguinal adenopathy present.       Left: No inguinal adenopathy present.  Neurological: She is alert and oriented to person, place, and time. No cranial nerve deficit. She exhibits normal muscle tone. Coordination normal.  Skin: Skin is warm and dry. No rash noted. She is not diaphoretic. No erythema.  Psychiatric: She has a normal mood and affect. Her behavior is normal. Judgment and thought content normal.       Assessment:     Recovering well 5 weeks out status post partial cecectomy and appendectomy for sessile serrated adenoma at appendiceal orifice with negative margins.     Plan:     Regular activity.  Low impact exercise such as walking an hour a day at least ideal.  Do not push through  pain.  Diet as tolerated.  Low fat high fiber diet ideal.  Bowel regimen with 30 g fiber a day and fiber supplement as needed to avoid  problems.  Followup colonoscopy per her gastroenterologist.  Anticipate in 3 years.  Return to clinic as needed.   Instructions discussed.  Followup with primary care physician for other health issues as would normally be done.  Consider screening for malignancies (breast, prostate, colon, melanoma, etc) as appropriate.  Questions answered.  The patient expressed understanding and appreciation

## 2014-04-25 ENCOUNTER — Emergency Department (HOSPITAL_COMMUNITY)
Admission: EM | Admit: 2014-04-25 | Discharge: 2014-04-26 | Disposition: A | Discharge status: Home / Self Care | Payer: BC Managed Care – PPO | Attending: Emergency Medicine | Admitting: Emergency Medicine

## 2014-04-25 ENCOUNTER — Emergency Department (HOSPITAL_COMMUNITY): Payer: BC Managed Care – PPO

## 2014-04-25 ENCOUNTER — Other Ambulatory Visit: Payer: Self-pay | Admitting: *Deleted

## 2014-04-25 ENCOUNTER — Encounter (HOSPITAL_COMMUNITY): Payer: Self-pay | Admitting: Emergency Medicine

## 2014-04-25 DIAGNOSIS — E669 Obesity, unspecified: Secondary | ICD-10-CM | POA: Insufficient documentation

## 2014-04-25 DIAGNOSIS — Z85048 Personal history of other malignant neoplasm of rectum, rectosigmoid junction, and anus: Secondary | ICD-10-CM | POA: Insufficient documentation

## 2014-04-25 DIAGNOSIS — F17200 Nicotine dependence, unspecified, uncomplicated: Secondary | ICD-10-CM | POA: Insufficient documentation

## 2014-04-25 DIAGNOSIS — Z86718 Personal history of other venous thrombosis and embolism: Secondary | ICD-10-CM | POA: Insufficient documentation

## 2014-04-25 DIAGNOSIS — I10 Essential (primary) hypertension: Secondary | ICD-10-CM | POA: Insufficient documentation

## 2014-04-25 DIAGNOSIS — M898X9 Other specified disorders of bone, unspecified site: Secondary | ICD-10-CM | POA: Insufficient documentation

## 2014-04-25 DIAGNOSIS — Z7901 Long term (current) use of anticoagulants: Secondary | ICD-10-CM | POA: Insufficient documentation

## 2014-04-25 DIAGNOSIS — M1710 Unilateral primary osteoarthritis, unspecified knee: Secondary | ICD-10-CM | POA: Insufficient documentation

## 2014-04-25 DIAGNOSIS — Z862 Personal history of diseases of the blood and blood-forming organs and certain disorders involving the immune mechanism: Secondary | ICD-10-CM | POA: Insufficient documentation

## 2014-04-25 DIAGNOSIS — G4730 Sleep apnea, unspecified: Secondary | ICD-10-CM | POA: Insufficient documentation

## 2014-04-25 DIAGNOSIS — Z86711 Personal history of pulmonary embolism: Secondary | ICD-10-CM | POA: Insufficient documentation

## 2014-04-25 DIAGNOSIS — M25569 Pain in unspecified knee: Secondary | ICD-10-CM | POA: Insufficient documentation

## 2014-04-25 DIAGNOSIS — M129 Arthropathy, unspecified: Secondary | ICD-10-CM | POA: Insufficient documentation

## 2014-04-25 DIAGNOSIS — IMO0002 Reserved for concepts with insufficient information to code with codable children: Secondary | ICD-10-CM | POA: Insufficient documentation

## 2014-04-25 DIAGNOSIS — Z7982 Long term (current) use of aspirin: Secondary | ICD-10-CM | POA: Insufficient documentation

## 2014-04-25 DIAGNOSIS — M1712 Unilateral primary osteoarthritis, left knee: Secondary | ICD-10-CM

## 2014-04-25 DIAGNOSIS — Z8619 Personal history of other infectious and parasitic diseases: Secondary | ICD-10-CM | POA: Insufficient documentation

## 2014-04-25 DIAGNOSIS — Z8719 Personal history of other diseases of the digestive system: Secondary | ICD-10-CM | POA: Insufficient documentation

## 2014-04-25 DIAGNOSIS — M25562 Pain in left knee: Secondary | ICD-10-CM

## 2014-04-25 DIAGNOSIS — Z9981 Dependence on supplemental oxygen: Secondary | ICD-10-CM | POA: Insufficient documentation

## 2014-04-25 NOTE — ED Provider Notes (Signed)
CSN: [id number]     Arrival date & time 04/25/14  2055 History   First MD Initiated Contact with Patient 04/25/14 2308     This chart was scribed for non-physician practitioner, Antonietta Breach, PA-C working with Wynetta Fines, MD by Forrestine Him, ED Scribe. This patient was seen in room WTR5/WTR5 and the patient's care was started at 11:51 PM.   Chief Complaint  Patient presents with  . Knee Pain   HPI  HPI Comments: Alicia Martinez is a 58 y.o. female who presents to the Emergency Department complaining of waxing and waning, moderate L knee pain with mild associated swelling x 3 days that has progressively worsened. No recent injury or trauma. She has tried OTC Aleve without any improvement for symptoms. At this time she denies any loss of sensation, numbness, or weakness. No redness to the area. Pt is currently on Coumadin started more than 5 years ago. No clotting since starting on this medication. She is currently followed by Dr. Milta Deiters [facility]. No known allergies to medications. No other concerns this visit.   Past Medical History  Diagnosis Date  . Deep vein thrombosis     left lower extremity  . Pulmonary embolism     LAST ONE WAS 1995- ON CHRONIC COUMADIN  . Allergy   . Anemia   . Hypertension   . History of rectal cancer     T1N0 resected 08/2009  . Diverticulitis large intestine   . Obesity   . Infectious colitis   . Sleep apnea 08/09/2012    Sleep study 02/2013 : Severe obstructive sleep apnea/hypopnea syndrome, AHI 88.7, desat to 84%. CPAP titration to 15 CWP, AHI 0 per hour. Medium ResMed Mirage Quattro full-face mask with heated humidifier.      . Pain     LOWER BACK AND RT KNEE AND BOTH SHOULDERS - LEFT SHOULDER PAIN WORSE--PT TOLD ARTHRITIS  . Arthritis     DJD, ARTHRITIS  . Heart palpitations     HX OF PALPITATIONS AND SOME DIZZINESS - EVALUATED BY CARDIOLOGIST DR. ROSS - OFFICE NOTES IS EPIC 08/24/13 - "EVENT MONITOR SHOWED NO SIGNIFICANT  ARRHYTHMIA"  . Carotid artery plaque 08-30-13    PER CAROTID DOPPLER STUDY   Past Surgical History  Procedure Laterality Date  . Abdominal hysterectomy    . Tubal ligation    . Transanal rectal resection  08/2009    T1N0 rectal cancer Dr. Johney [place]  . Colonoscopy    . Cecostomy N/A 03/15/2014    Procedure: partial CECECTOMY;  Surgeon: Adin Hector, MD;  Location: [facility];  Service: General;  Laterality: N/A;  . Laparoscopic appendectomy N/A 03/15/2014    Procedure: APPENDECTOMY LAPAROSCOPIC;  Surgeon: Adin Hector, MD;  Location: [facility];  Service: General;  Laterality: N/A;  . Laparoscopic lysis of adhesions N/A 03/15/2014    Procedure: LAPAROSCOPIC LYSIS OF ADHESIONS;  Surgeon: Adin Hector, MD;  Location: [facility];  Service: General;  Laterality: N/A;   Family History  Problem Relation Age of Onset  . Cancer Mother     unknown type but patient thinks it was bone marrow cancer  . Heart disease Sister   . Heart disease Brother   . Clotting disorder Brother   . Colon cancer Neg Hx   . Stomach cancer Neg Hx    History  Substance Use Topics  . Smoking status: Current Some Day Smoker -- 0.25 packs/day for 40 years    Types: Cigarettes  .  Smokeless tobacco: Never Used  . Alcohol Use: No   OB History   Grav Para Term Preterm Abortions TAB SAB Ect Mult Living                  Review of Systems  Musculoskeletal: Positive for arthralgias (L knee) and joint swelling (L knee).  Neurological: Negative for weakness and numbness.  All other systems reviewed and are negative.    Allergies  Other  Home Medications   Prior to Admission medications   Medication Sig Start Date End Date Taking? Authorizing Provider  aspirin 81 MG tablet Take 81 mg by mouth daily.   Yes Historical Provider, MD  capsicum (MUSCLE RELIEF) 0.075 % topical cream Apply 1 application topically 2 (two) times daily as needed (muscle pain).   Yes Historical Provider, MD  hydrochlorothiazide (HYDRODIURIL) 25  MG tablet Take 12.5 mg by mouth every morning.   Yes Historical Provider, MD  Psyllium (METAMUCIL PO) Take by mouth. ONCE A DAY   Yes Historical Provider, MD  tetrahydrozoline 0.05 % ophthalmic solution Place 1 drop into both eyes as needed (allergies/red eyes). VISINE   Yes Historical Provider, MD  warfarin (COUMADIN) 5 MG tablet Take 5 mg by mouth every morning.   Yes Historical Provider, MD   Triage Vitals: BP 148/76  Pulse 64  Temp(Src) 98.2 F (36.8 C) (Oral)  Resp 18  SpO2 100%   Physical Exam  Nursing note and vitals reviewed. Constitutional: She is oriented to person, place, and time. She appears well-developed and well-nourished. No distress.  Nontoxic/nonseptic appearing  HENT:  Head: Normocephalic and atraumatic.  Eyes: Conjunctivae and EOM are normal. No scleral icterus.  Neck: Normal range of motion.  Cardiovascular: Normal rate, regular rhythm and intact distal pulses.   DP and PT pulses 2+ in LLE  Pulmonary/Chest: Effort normal. No respiratory distress.  Abdominal: She exhibits no distension.  Musculoskeletal: Normal range of motion.       Left knee: She exhibits normal range of motion, no swelling, no effusion, no deformity, no erythema, normal alignment, no LCL laxity and no MCL laxity. Tenderness found. Medial joint line tenderness noted.  Full AROM and PROM of L knee. No bony deformity or crepitus. No appreciable swelling; however exam limited secondary to body habitus. No erythema or heat to touch of L knee.  Neurological: She is alert and oriented to person, place, and time. She exhibits normal muscle tone. Coordination normal.  No gross sensory deficits appreciated. 5/5 strength against resistance with knee flexion and extension. Patient ambulates with normal gait.  Skin: Skin is warm and dry. No rash noted. She is not diaphoretic. No erythema. No pallor.  Psychiatric: She has a normal mood and affect. Her behavior is normal.    ED Course  Procedures (including  critical care time)  DIAGNOSTIC STUDIES: Oxygen Saturation is 100% on RA, Normal by my interpretation.    COORDINATION OF CARE: 11:59 PM- Will order imaging. Discussed treatment plan with pt at bedside and pt agreed to plan.     Labs Review Labs Reviewed - No data to display  Imaging Review Dg Knee Complete 4 Views Left  04/25/2014   CLINICAL DATA:  Left knee pain.  No injury.  EXAM: LEFT KNEE - COMPLETE 4+ VIEW  COMPARISON:  None.  FINDINGS: Tricompartment degenerative changes in the left knee with medial greater than lateral compartment narrowing and prominent osteophytic changes in all 3 compartments. No evidence of acute fracture or dislocation in the left  knee. Exostosis arising from the proximal left fibula. No significant effusion. Soft tissues are unremarkable.  IMPRESSION: Degenerative changes in the left knee. Exostosis arising from the proximal fibula. No acute bony abnormalities.   Electronically Signed   By: Lucienne Capers M.D.   On: 04/25/2014 22:29     EKG Interpretation None      MDM   Final diagnoses:  Knee pain, left  Osteoarthritis of left knee, unspecified osteoarthritis type  Exostosis    Patient presents for atraumatic L knee pain. Patient neurovascularly intact. No gross sensory deficits. On coumadin for hx of blood clots; no recurrence of clots for >10 years, since starting blood thinners. No hx of filter placement. No calf swelling or tenderness. No LLE edema. TTP limited to medial joint line. No crepitus or deformity. No evidence of septic joint. Imaging negative for acute injury, showing only degenerative changes. Symptoms c/w osteoarthritis. Knee sleeve and RICE recommended. Counseled to d/c aleve because of coumadin. Will prescribe Norco for pain. Ortho referral advised and return precautions discussed. Patient agreeable to plan with no unaddressed concerns.  I personally performed the services described in this documentation, which was scribed in my  presence. The recorded information has been reviewed and is accurate.    Antonietta Breach, PA-C 04/29/14 1805

## 2014-04-25 NOTE — ED Notes (Signed)
Pt is c/o left knee pain  Pt denies injury  Pt states she has some swelling noted  Pt has jeans on  Pt states the pain peaks and then goes down and then comes back up

## 2014-04-26 MED ORDER — HYDROCODONE-ACETAMINOPHEN 5-325 MG PO TABS: 1.0000 | ORAL_TABLET | Freq: Four times a day (QID) | ORAL | Status: DC | PRN

## 2014-04-26 MED ORDER — HYDROCODONE-ACETAMINOPHEN 5-325 MG PO TABS
2.0000 | ORAL_TABLET | Freq: Once | ORAL | Status: AC
  Administered 2014-04-26: 2 via ORAL
  Filled 2014-04-26: qty 2

## 2014-04-26 NOTE — ED Notes (Signed)
Pt has a ride home.  

## 2014-04-26 NOTE — Discharge Instructions (Signed)
Arthritis, Nonspecific °Arthritis is inflammation of a joint. This usually means pain, redness, warmth or swelling are present. One or more joints may be involved. There are a number of types of arthritis. Your caregiver may not be able to tell what type of arthritis you have right away. °CAUSES  °The most common cause of arthritis is the wear and tear on the joint (osteoarthritis). This causes damage to the cartilage, which can break down over time. The knees, hips, back and neck are most often affected by this type of arthritis. °Other types of arthritis and common causes of joint pain include: °· Sprains and other injuries near the joint. Sometimes minor sprains and injuries cause pain and swelling that develop hours later. °· Rheumatoid arthritis. This affects hands, feet and knees. It usually affects both sides of your body at the same time. It is often associated with chronic ailments, fever, weight loss and general weakness. °· Crystal arthritis. Gout and pseudo gout can cause occasional acute severe pain, redness and swelling in the foot, ankle, or knee. °· Infectious arthritis. Bacteria can get into a joint through a break in overlying skin. This can cause infection of the joint. Bacteria and viruses can also spread through the blood and affect your joints. °· Drug, infectious and allergy reactions. Sometimes joints can become mildly painful and slightly swollen with these types of illnesses. °SYMPTOMS  °· Pain is the main symptom. °· Your joint or joints can also be red, swollen and warm or hot to the touch. °· You may have a fever with certain types of arthritis, or even feel overall ill. °· The joint with arthritis will hurt with movement. Stiffness is present with some types of arthritis. °DIAGNOSIS  °Your caregiver will suspect arthritis based on your description of your symptoms and on your exam. Testing may be needed to find the type of arthritis: °· Blood and sometimes urine tests. °· X-ray tests  and sometimes CT or MRI scans. °· Removal of fluid from the joint (arthrocentesis) is done to check for bacteria, crystals or other causes. Your caregiver (or a specialist) will numb the area over the joint with a local anesthetic, and use a needle to remove joint fluid for examination. This procedure is only minimally uncomfortable. °· Even with these tests, your caregiver may not be able to tell what kind of arthritis you have. Consultation with a specialist (rheumatologist) may be helpful. °TREATMENT  °Your caregiver will discuss with you treatment specific to your type of arthritis. If the specific type cannot be determined, then the following general recommendations may apply. °Treatment of severe joint pain includes: °· Rest. °· Elevation. °· Anti-inflammatory medication (for example, ibuprofen) may be prescribed. Avoiding activities that cause increased pain. °· Only take over-the-counter or prescription medicines for pain and discomfort as recommended by your caregiver. °· Cold packs over an inflamed joint may be used for 10 to 15 minutes every hour. Hot packs sometimes feel better, but do not use overnight. Do not use hot packs if you are diabetic without your caregiver's permission. °· A cortisone shot into arthritic joints may help reduce pain and swelling. °· Any acute arthritis that gets worse over the next 1 to 2 days needs to be looked at to be sure there is no joint infection. °Long-term arthritis treatment involves modifying activities and lifestyle to reduce joint stress jarring. This can include weight loss. Also, exercise is needed to nourish the joint cartilage and remove waste. This helps keep the muscles   around the joint strong. HOME CARE INSTRUCTIONS   Do not take aspirin to relieve pain if gout is suspected. This elevates uric acid levels.  Only take over-the-counter or prescription medicines for pain, discomfort or fever as directed by your caregiver.  Rest the joint as much as  possible.  If your joint is swollen, keep it elevated.  Use crutches if the painful joint is in your leg.  Drinking plenty of fluids may help for certain types of arthritis.  Follow your caregiver's dietary instructions.  Try low-impact exercise such as:  Swimming.  Water aerobics.  Biking.  Walking.  Morning stiffness is often relieved by a warm shower.  Put your joints through regular range-of-motion. SEEK MEDICAL CARE IF:   You do not feel better in 24 hours or are getting worse.  You have side effects to medications, or are not getting better with treatment. SEEK IMMEDIATE MEDICAL CARE IF:   You have a fever.  You develop severe joint pain, swelling or redness.  Many joints are involved and become painful and swollen.  There is severe back pain and/or leg weakness.  You have loss of bowel or bladder control. Document Released: 10/21/2004 Document Revised: 12/06/2011 Document Reviewed: 11/06/2008 [facility] Patient Information 2015 [place], [place]. This information is not intended to replace advice given to you by your health care provider. Make sure you discuss any questions you have with your health care provider. RICE: Routine Care for Injuries The routine care of many injuries includes Rest, Ice, Compression, and Elevation (RICE). HOME CARE INSTRUCTIONS  Rest is needed to allow your body to heal. Routine activities can usually be resumed when comfortable. Injured tendons and bones can take up to 6 weeks to heal. Tendons are the cord-like structures that attach muscle to bone.  Ice following an injury helps keep the swelling down and reduces pain.  Put ice in a plastic bag.  Place a towel between your skin and the bag.  Leave the ice on for 15-20 minutes, 3-4 times a day, or as directed by your health care provider. Do this while awake, for the first 24 to 48 hours. After that, continue as directed by your caregiver.  Compression helps keep swelling down. It  also gives support and helps with discomfort. If an elastic bandage has been applied, it should be removed and reapplied every 3 to 4 hours. It should not be applied tightly, but firmly enough to keep swelling down. Watch fingers or toes for swelling, bluish discoloration, coldness, numbness, or excessive pain. If any of these problems occur, remove the bandage and reapply loosely. Contact your caregiver if these problems continue.  Elevation helps reduce swelling and decreases pain. With extremities, such as the arms, hands, legs, and feet, the injured area should be placed near or above the level of the heart, if possible. SEEK IMMEDIATE MEDICAL CARE IF:  You have persistent pain and swelling.  You develop redness, numbness, or unexpected weakness.  Your symptoms are getting worse rather than improving after several days. These symptoms may indicate that further evaluation or further X-rays are needed. Sometimes, X-rays may not show a small broken bone (fracture) until 1 week or 10 days later. Make a follow-up appointment with your caregiver. Ask when your X-ray results will be ready. Make sure you get your X-ray results. Document Released: 12/26/2000 Document Revised: 09/18/2013 Document Reviewed: 02/12/2011 [facility] Patient Information 2015 [place], [place]. This information is not intended to replace advice given to you by your health care  provider. Make sure you discuss any questions you have with your health care provider. ° °

## 2014-04-29 ENCOUNTER — Encounter: Payer: Self-pay | Admitting: Internal Medicine

## 2014-04-29 NOTE — ED Provider Notes (Signed)
Medical screening examination/treatment/procedure(s) were performed by non-physician practitioner and as supervising physician I was immediately available for consultation/collaboration.   EKG Interpretation None        Wynetta Fines, MD 04/29/14 2241

## 2014-05-11 ENCOUNTER — Encounter (HOSPITAL_COMMUNITY): Payer: Self-pay | Admitting: Emergency Medicine

## 2014-05-11 ENCOUNTER — Emergency Department (HOSPITAL_COMMUNITY)
Admission: EM | Admit: 2014-05-11 | Discharge: 2014-05-11 | Disposition: A | Discharge status: Home / Self Care | Payer: BC Managed Care – PPO | Attending: Emergency Medicine | Admitting: Emergency Medicine

## 2014-05-11 DIAGNOSIS — Z8619 Personal history of other infectious and parasitic diseases: Secondary | ICD-10-CM | POA: Diagnosis not present

## 2014-05-11 DIAGNOSIS — Z7901 Long term (current) use of anticoagulants: Secondary | ICD-10-CM | POA: Insufficient documentation

## 2014-05-11 DIAGNOSIS — Z86711 Personal history of pulmonary embolism: Secondary | ICD-10-CM | POA: Insufficient documentation

## 2014-05-11 DIAGNOSIS — Z9981 Dependence on supplemental oxygen: Secondary | ICD-10-CM | POA: Diagnosis not present

## 2014-05-11 DIAGNOSIS — I10 Essential (primary) hypertension: Secondary | ICD-10-CM | POA: Insufficient documentation

## 2014-05-11 DIAGNOSIS — Z862 Personal history of diseases of the blood and blood-forming organs and certain disorders involving the immune mechanism: Secondary | ICD-10-CM | POA: Diagnosis not present

## 2014-05-11 DIAGNOSIS — Z8719 Personal history of other diseases of the digestive system: Secondary | ICD-10-CM | POA: Diagnosis not present

## 2014-05-11 DIAGNOSIS — E669 Obesity, unspecified: Secondary | ICD-10-CM | POA: Insufficient documentation

## 2014-05-11 DIAGNOSIS — M25562 Pain in left knee: Secondary | ICD-10-CM

## 2014-05-11 DIAGNOSIS — F17200 Nicotine dependence, unspecified, uncomplicated: Secondary | ICD-10-CM | POA: Diagnosis not present

## 2014-05-11 DIAGNOSIS — Z79899 Other long term (current) drug therapy: Secondary | ICD-10-CM | POA: Diagnosis not present

## 2014-05-11 DIAGNOSIS — M1990 Unspecified osteoarthritis, unspecified site: Secondary | ICD-10-CM | POA: Insufficient documentation

## 2014-05-11 DIAGNOSIS — Z7982 Long term (current) use of aspirin: Secondary | ICD-10-CM | POA: Diagnosis not present

## 2014-05-11 DIAGNOSIS — Z86718 Personal history of other venous thrombosis and embolism: Secondary | ICD-10-CM | POA: Insufficient documentation

## 2014-05-11 DIAGNOSIS — Z85048 Personal history of other malignant neoplasm of rectum, rectosigmoid junction, and anus: Secondary | ICD-10-CM | POA: Diagnosis not present

## 2014-05-11 DIAGNOSIS — Z8709 Personal history of other diseases of the respiratory system: Secondary | ICD-10-CM | POA: Diagnosis not present

## 2014-05-11 DIAGNOSIS — M25569 Pain in unspecified knee: Secondary | ICD-10-CM | POA: Diagnosis not present

## 2014-05-11 DIAGNOSIS — G4733 Obstructive sleep apnea (adult) (pediatric): Secondary | ICD-10-CM | POA: Diagnosis not present

## 2014-05-11 MED ORDER — OXYCODONE-ACETAMINOPHEN 5-325 MG PO TABS: 1.0000 | ORAL_TABLET | Freq: Four times a day (QID) | ORAL | Status: DC | PRN

## 2014-05-11 NOTE — ED Notes (Signed)
Patient stated she is having pain in her left knee. Patient stated that the pain in the left knee has been there for about a month. Patient says she was suppose to have a dr appt on Monday but they called and canceled and she has to call Monday to reschedule her appt. Patient says the pain has been getting worse.

## 2014-05-11 NOTE — Discharge Instructions (Signed)
Please read and follow all provided instructions.  Your diagnoses today include:  1. Left knee pain     Tests performed today include:  Vital signs. See below for your results today.   Medications prescribed:   Percocet (oxycodone/acetaminophen) - narcotic pain medication  DO NOT drive or perform any activities that require you to be awake and alert because this medicine can make you drowsy. BE VERY CAREFUL not to take multiple medicines containing Tylenol (also called acetaminophen). Doing so can lead to an overdose which can damage your liver and cause liver failure and possibly death.  Take any prescribed medications only as directed.  Home care instructions:   Follow any educational materials contained in this packet  Follow R.I.C.E. Protocol:  R - rest your injury   I  - use ice on injury without applying directly to skin  C - compress injury with bandage or splint  E - elevate the injury as much as possible  Follow-up instructions: Please follow-up with your primary care provider or your sports medicine physician for recheck in the next week. In this case you may have a severe injury that requires further care.   Return instructions:   Please return if your toes are numb or tingling, appear gray or blue, or you have severe pain (also elevate leg and loosen splint or wrap if you were given one)  Please return to the Emergency Department if you experience worsening symptoms.   Please return if you have any other emergent concerns.  Additional Information:  Your vital signs today were: BP 167/88   Pulse 73   Temp(Src) 98.5 F (36.9 C) (Oral)   Resp 16   SpO2 97% If your blood pressure (BP) was elevated above 135/85 this visit, please have this repeated by your doctor within one month. --------------

## 2014-05-11 NOTE — ED Notes (Signed)
Patient c/o ongoing left knee pain. Patient states she was seen here 2 weeks ago, had XR done showing bone loss, was told to come back if symptoms did not improve. This is ongoing x 1 month.

## 2014-05-11 NOTE — ED Provider Notes (Signed)
CSN: [id number]     Arrival date & time 05/11/14  2000 History   First MD Initiated Contact with Patient 05/11/14 2233     Chief Complaint  Patient presents with  . Knee Pain    left     (Consider location/radiation/quality/duration/timing/severity/associated sxs/prior Treatment) HPI Comments: Patient with history of osteoarthritis in both of her knees, DVT/PE on Coumadin presents with persistent left knee pain. Patient seen in emergency department on 04/25/2014 and had x-rays which were negative. She was discharged to home with Vicodin which he has been taking as needed for pain. She scheduled an appointment with her sports medicine doctor but has not yet seen them. She denies new injuries however her left knee pain is continuing. She has been ambulatory with pain. She denies weakness, numbness, tingling in her lower extremity. She denies pain in her calf or foot. She denies pain in her hip. No changes in skin color. Pain is made slightly better when she stands up straight. Pain does not have features of claudication.   The history is provided by medical records.    Past Medical History  Diagnosis Date  . Deep vein thrombosis     left lower extremity  . Pulmonary embolism     LAST ONE WAS 1995- ON CHRONIC COUMADIN  . Allergy   . Anemia   . Hypertension   . History of rectal cancer     T1N0 resected 08/2009  . Diverticulitis large intestine   . Obesity   . Infectious colitis   . Sleep apnea 08/09/2012    Sleep study 02/2013 : Severe obstructive sleep apnea/hypopnea syndrome, AHI 88.7, desat to 84%. CPAP titration to 15 CWP, AHI 0 per hour. Medium ResMed Mirage Quattro full-face mask with heated humidifier.      . Pain     LOWER BACK AND RT KNEE AND BOTH SHOULDERS - LEFT SHOULDER PAIN WORSE--PT TOLD ARTHRITIS  . Arthritis     DJD, ARTHRITIS  . Heart palpitations     HX OF PALPITATIONS AND SOME DIZZINESS - EVALUATED BY CARDIOLOGIST DR. ROSS - OFFICE NOTES IS EPIC 08/24/13 - "EVENT  MONITOR SHOWED NO SIGNIFICANT ARRHYTHMIA"  . Carotid artery plaque 08-30-13    PER CAROTID DOPPLER STUDY   Past Surgical History  Procedure Laterality Date  . Abdominal hysterectomy    . Tubal ligation    . Transanal rectal resection  08/2009    T1N0 rectal cancer Dr. Johney [place]  . Colonoscopy    . Cecostomy N/A 03/15/2014    Procedure: partial CECECTOMY;  Surgeon: Adin Hector, MD;  Location: [facility];  Service: General;  Laterality: N/A;  . Laparoscopic appendectomy N/A 03/15/2014    Procedure: APPENDECTOMY LAPAROSCOPIC;  Surgeon: Adin Hector, MD;  Location: [facility];  Service: General;  Laterality: N/A;  . Laparoscopic lysis of adhesions N/A 03/15/2014    Procedure: LAPAROSCOPIC LYSIS OF ADHESIONS;  Surgeon: Adin Hector, MD;  Location: [facility];  Service: General;  Laterality: N/A;   Family History  Problem Relation Age of Onset  . Cancer Mother     unknown type but patient thinks it was bone marrow cancer  . Heart disease Sister   . Heart disease Brother   . Clotting disorder Brother   . Colon cancer Neg Hx   . Stomach cancer Neg Hx    History  Substance Use Topics  . Smoking status: Current Some Day Smoker -- 0.25 packs/day for 40 years    Types: Cigarettes  .  Smokeless tobacco: Never Used  . Alcohol Use: No   OB History   Grav Para Term Preterm Abortions TAB SAB Ect Mult Living                 Review of Systems  Constitutional: Negative for activity change.  Musculoskeletal: Positive for arthralgias. Negative for back pain, joint swelling and neck pain.  Skin: Negative for wound.  Neurological: Negative for weakness and numbness.   Allergies  Other  Home Medications   Prior to Admission medications   Medication Sig Start Date End Date Taking? Authorizing Provider  aspirin 81 MG tablet Take 81 mg by mouth daily.   Yes Historical Provider, MD  capsicum (MUSCLE RELIEF) 0.075 % topical cream Apply 1 application topically 2 (two) times daily as needed (muscle pain).    Yes Historical Provider, MD  hydrochlorothiazide (HYDRODIURIL) 25 MG tablet Take 12.5 mg by mouth every morning.   Yes Historical Provider, MD  HYDROcodone-acetaminophen (NORCO/VICODIN) 5-325 MG per tablet Take 1-2 tablets by mouth every 6 (six) hours as needed for moderate pain or severe pain. 04/26/14  Yes Antonietta Breach, PA-C  Psyllium (METAMUCIL PO) Take 1 packet by mouth daily.    Yes Historical Provider, MD  tetrahydrozoline 0.05 % ophthalmic solution Place 1 drop into both eyes as needed (allergies/red eyes). VISINE   Yes Historical Provider, MD  warfarin (COUMADIN) 5 MG tablet Take 5-7.5 mg by mouth daily. Take 1.5 tablets on Monday,wednesday, & Friday (7.5mg ) Take 1 tablet on all other days   Yes Historical Provider, MD   BP 151/77  Pulse 84  Temp(Src) 98.5 F (36.9 C) (Oral)  Resp 16  SpO2 99%  Physical Exam  Nursing note and vitals reviewed. Constitutional: She appears well-developed and well-nourished.  HENT:  Head: Normocephalic and atraumatic.  Eyes: Pupils are equal, round, and reactive to light.  Neck: Normal range of motion. Neck supple.  Cardiovascular: Exam reveals no decreased pulses.   Pulses:      Dorsalis pedis pulses are 2+ on the right side, and 2+ on the left side.       Posterior tibial pulses are 2+ on the right side, and 2+ on the left side.  Musculoskeletal: She exhibits tenderness. She exhibits no edema.       Left hip: Normal.       Left knee: She exhibits normal range of motion, no swelling and no effusion. Tenderness found. Medial joint line and lateral joint line tenderness noted.       Left ankle: She exhibits normal range of motion.       Lumbar back: Normal.       Left upper leg: Normal.       Left lower leg: Normal. She exhibits no edema.       Legs:      Left foot: Normal.  Neurological: She is alert. No sensory deficit.  Motor, sensation, and vascular distal to the injury is fully intact.   Skin: Skin is warm and dry.  Psychiatric: She has  a normal mood and affect.    ED Course  Procedures (including critical care time) Labs Review Labs Reviewed - No data to display  Imaging Review No results found.   EKG Interpretation None      10:46 PM Patient seen and examined. Medications ordered. Previous x-ray reviewed.   Vital signs reviewed and are as follows: BP 151/77  Pulse 84  Temp(Src) 98.5 F (36.9 C) (Oral)  Resp 16  SpO2 99%  Patient has good ROM L knee. No redness, warmth, or large effusion.   Patient has upcoming f/u with her sports medicine physician Dr. Milta Deiters.   Will change pain medication to Percocet as the Vicodin was not helping much. Patient counseled on use of narcotic pain medications. Counseled not to combine these medications with others containing tylenol. Urged not to drink alcohol, drive, or perform any other activities that requires focus while taking these medications. The patient verbalizes understanding and agrees with the plan.   MDM   Final diagnoses:  Left knee pain   Left knee pain. Doubt gout, pseudogout. Very low suspicion for septic arthritis given good ROM, no fever or other symptoms. Suspect osteoarthritis. Lower extremity is neurovascularly intact. No calf tenderness to suggest DVT.    Carlisle Cater, PA-C 05/11/14 2333

## 2014-05-12 NOTE — ED Provider Notes (Signed)
Medical screening examination/treatment/procedure(s) were performed by non-physician practitioner and as supervising physician I was immediately available for consultation/collaboration.   EKG Interpretation None       Orlie Dakin, MD 05/12/14 [phone]

## 2014-05-13 ENCOUNTER — Ambulatory Visit: Payer: BC Managed Care – PPO | Admitting: Family Medicine

## 2014-05-20 ENCOUNTER — Encounter: Payer: Self-pay | Admitting: Internal Medicine

## 2014-05-24 ENCOUNTER — Encounter: Payer: Self-pay | Admitting: Family Medicine

## 2014-05-24 ENCOUNTER — Ambulatory Visit (INDEPENDENT_AMBULATORY_CARE_PROVIDER_SITE_OTHER): Payer: BC Managed Care – PPO | Admitting: Family Medicine

## 2014-05-24 VITALS — BP 135/57 | HR 81 | Ht 66.0 in | Wt 275.0 lb

## 2014-05-24 DIAGNOSIS — M1710 Unilateral primary osteoarthritis, unspecified knee: Secondary | ICD-10-CM | POA: Diagnosis not present

## 2014-05-24 DIAGNOSIS — M170 Bilateral primary osteoarthritis of knee: Secondary | ICD-10-CM

## 2014-05-24 MED ORDER — METHYLPREDNISOLONE ACETATE 40 MG/ML IJ SUSP
40.0000 mg | Freq: Once | INTRAMUSCULAR | Status: AC
  Administered 2014-05-24: 40 mg via INTRA_ARTICULAR

## 2014-05-24 NOTE — Assessment & Plan Note (Signed)
Patient with bilateral severe multi-compartment osteoarthritis of bilateral knees. Patient treated today for her left knee osteoarthritis with a cortisone steroid injection. Advised patient that we can continue steroid injections every 6 months for pain control as long she has maintaining a good response to this intervention. We can also try hyaluronic supplementation injections in the future as well. Patient not interested in total  joint replacement at this time. Follow up when necessary

## 2014-05-24 NOTE — Progress Notes (Signed)
   Subjective:    Patient ID: Alicia Martinez, female    DOB: 1956/03/13, 58 y.o.   MRN: [id number]  HPI Left knee pain is diffuse rates it 8/10, worse at night. Has been worsening of the last 6 months. Patient worsened she had a good response to cortisone injection in her right knee that she had back in May. Describes pain and stiffness. In the morning, giving way and locking up. No warmth erythema or effusion.  Left knee 4 view x-rays from July 2015 showed tricompartment degenerative changes with joint space narrowing and osteophyte formation  PERTINENT  PMH / PSH: Anticoagulation Hx rectal cancer Hx recurrent DVT smoker  Review of Systems See history of present illness above. Additional pertinent review of systems is negative for fever, sweats, chills, unusual weight change.    Objective:   Physical Exam GEN WD female, overweight. NAD KNEES Bilateral symmetrical with full extension and flexion. B crepitus. No effusion, no erythema or warmth. Calf is soft. Medial and lateral joint line tenderness, small point area of tenderness on proximal pole patella. Ligaments intact to varus and valgus stress. Normal Lachman. Distally neurovascularly intact.  INJECTION: Patient was given informed consent, signed copy in the chart. Appropriate time out was taken. Area prepped and draped in usual sterile fashion. 1 cc of methylprednisolone 40 mg/ml plus  4 cc of 1% lidocaine without epinephrine was injected into the left knee using a(n) anterior medial approach. NOTE: significant synovial thickening noted on injection. Small, less than 1 cc total bleeding but careful attantion to hemostasis given anticoagulation.The patient tolerated the procedure well. There were no complications. Post procedure instructions were given     Assessment & Plan:  Bilateral tricompartment osteoarthritis of the knees  Knee pain Left CSI

## 2014-06-12 ENCOUNTER — Other Ambulatory Visit: Payer: Self-pay | Admitting: Internal Medicine

## 2014-06-14 NOTE — Telephone Encounter (Signed)
Pt scheduled for Monday.

## 2014-06-17 ENCOUNTER — Ambulatory Visit (INDEPENDENT_AMBULATORY_CARE_PROVIDER_SITE_OTHER): Payer: BC Managed Care – PPO | Admitting: Pharmacist

## 2014-06-17 DIAGNOSIS — Z7901 Long term (current) use of anticoagulants: Secondary | ICD-10-CM

## 2014-06-17 LAB — POCT INR: INR: 1.2

## 2014-06-17 MED ORDER — WARFARIN SODIUM 5 MG PO TABS: ORAL_TABLET | ORAL | Status: DC

## 2014-06-17 NOTE — Progress Notes (Signed)
Anti-Coagulation Progress Note  Alicia Martinez is a 58 y.o. female who is currently on an anti-coagulation regimen.    RECENT RESULTS: Recent results are below, the most recent result is correlated with a dose of ZERO mg. per week--patient took last dose on 17-SEP-15 and then ran out of warfarin:  Will authorize re-fill and RTC in 2 weeks on her 42.5mg /wk regimen she was supposed to be taking.  Lab Results  Component Value Date   INR 1.20 06/17/2014   INR 2.10 03/25/2014   INR 1.11 03/15/2014    ANTI-COAG DOSE: Anticoagulation Dose Instructions as of 06/17/2014     [name] Tue Wed Thu Fri Sat   New Dose 5 mg 7.5 mg 5 mg 7.5 mg 5 mg 7.5 mg 5 mg       ANTICOAG SUMMARY: Anticoagulation Episode Summary   Current INR goal 2.0-3.0  Next INR check 07/01/2014  INR from last check 1.20! (06/17/2014)  Weekly max dose   Target end date Indefinite  [facility] check [facility]  Preferred lab   Send INR reminders to    Indications  DEEP VENOUS THROMBOPHLEBITIS RECURRENT (Resolved) [453.40] Long term current use of anticoagulants due to h/o PE and recurrent DVT with INR goal of 2.0-3.0 [V58.61]        Comments         ANTICOAG TODAY: Anticoagulation Summary as of 06/17/2014   INR goal 2.0-3.0  Selected INR 1.20! (06/17/2014)  Next INR check 07/01/2014  Target end date Indefinite   Indications  DEEP VENOUS THROMBOPHLEBITIS RECURRENT (Resolved) [453.40] Long term current use of anticoagulants due to h/o PE and recurrent DVT with INR goal of 2.0-3.0 [V58.61]      Anticoagulation Episode Summary   [facility] check [facility]   Preferred lab    Send INR reminders to    Comments       PATIENT INSTRUCTIONS: Patient Instructions  Patient instructed to take medications as defined in the Anti-coagulation Track section of this encounter.  Patient instructed to take today's dose.  Patient verbalized understanding of these instructions.       FOLLOW-UP Return in 2  weeks (on 07/01/2014) for Follow up INR at 1115am.  Jorene Guest, [name] Pharm.D., CACP

## 2014-06-17 NOTE — Patient Instructions (Signed)
Patient instructed to take medications as defined in the Anti-coagulation Track section of this encounter.  Patient instructed to take today's dose.  Patient verbalized understanding of these instructions.    

## 2014-06-18 NOTE — Progress Notes (Signed)
INTERNAL MEDICINE TEACHING ATTENDING ADDENDUM - Aldine Contes M.D  Duration- indefinite, Indication- recurrent DVT, INR- subtherapeutic. Agree with Dr. Gladstone Pih recommendations as outlined in his note.

## 2014-06-20 ENCOUNTER — Other Ambulatory Visit: Payer: Self-pay | Admitting: Internal Medicine

## 2014-06-20 DIAGNOSIS — Z1231 Encounter for screening mammogram for malignant neoplasm of breast: Secondary | ICD-10-CM

## 2014-06-24 ENCOUNTER — Ambulatory Visit (INDEPENDENT_AMBULATORY_CARE_PROVIDER_SITE_OTHER): Payer: BC Managed Care – PPO | Admitting: Internal Medicine

## 2014-06-24 ENCOUNTER — Encounter: Payer: Self-pay | Admitting: Internal Medicine

## 2014-06-24 VITALS — BP 139/71 | HR 88 | Temp 98.2°F | Ht 66.0 in | Wt 277.1 lb

## 2014-06-24 DIAGNOSIS — F17200 Nicotine dependence, unspecified, uncomplicated: Secondary | ICD-10-CM

## 2014-06-24 DIAGNOSIS — Z23 Encounter for immunization: Secondary | ICD-10-CM

## 2014-06-24 DIAGNOSIS — Z1231 Encounter for screening mammogram for malignant neoplasm of breast: Secondary | ICD-10-CM

## 2014-06-24 DIAGNOSIS — I10 Essential (primary) hypertension: Secondary | ICD-10-CM | POA: Diagnosis not present

## 2014-06-24 DIAGNOSIS — Z7901 Long term (current) use of anticoagulants: Secondary | ICD-10-CM

## 2014-06-24 DIAGNOSIS — Z0000 Encounter for general adult medical examination without abnormal findings: Secondary | ICD-10-CM

## 2014-06-24 DIAGNOSIS — M1710 Unilateral primary osteoarthritis, unspecified knee: Secondary | ICD-10-CM

## 2014-06-24 DIAGNOSIS — M170 Bilateral primary osteoarthritis of knee: Secondary | ICD-10-CM

## 2014-06-24 NOTE — Progress Notes (Signed)
Patient ID: Alicia Martinez, female   DOB: April 22, 1956, 58 y.o.   MRN: [id number]    Subjective:   Patient ID: Alicia Martinez female    DOB: 11-09-1955 58 y.o.    MRN: [id number] Health Maintenance Due: Health Maintenance Due  Topic Date Due  . Pneumococcal Polysaccharide Vaccine (##1) 07/05/1960  . Tetanus/tdap  07/05/1977  . Mammogram  04/07/2014  . Influenza Vaccine  04/27/2014    _________________________________________________  HPI: [name] is a 58 y.o. female here for a routine visit.  Pt has a PMH outlined below.  Please see problem-based charting assessment and plan note for further details of medical issues addressed at today's visit.  PMH: Past Medical History  Diagnosis Date  . Deep vein thrombosis     left lower extremity  . Pulmonary embolism     LAST ONE WAS 1995- ON CHRONIC COUMADIN  . Allergy   . Anemia   . Hypertension   . History of rectal cancer     T1N0 resected 08/2009  . Diverticulitis large intestine   . Obesity   . Infectious colitis   . Sleep apnea 08/09/2012    Sleep study 02/2013 : Severe obstructive sleep apnea/hypopnea syndrome, AHI 88.7, desat to 84%. CPAP titration to 15 CWP, AHI 0 per hour. Medium ResMed Mirage Quattro full-face mask with heated humidifier.      . Pain     LOWER BACK AND RT KNEE AND BOTH SHOULDERS - LEFT SHOULDER PAIN WORSE--PT TOLD ARTHRITIS  . Arthritis     DJD, ARTHRITIS  . Heart palpitations     HX OF PALPITATIONS AND SOME DIZZINESS - EVALUATED BY CARDIOLOGIST DR. ROSS - OFFICE NOTES IS EPIC 08/24/13 - "EVENT MONITOR SHOWED NO SIGNIFICANT ARRHYTHMIA"  . Carotid artery plaque 08-30-13    PER CAROTID DOPPLER STUDY    Medications: Current Outpatient Prescriptions on File Prior to Visit  Medication Sig Dispense Refill  . aspirin 81 MG tablet Take 81 mg by mouth daily.      . capsicum (MUSCLE RELIEF) 0.075 % topical cream Apply 1 application topically 2 (two) times daily as needed (muscle pain).       . hydrochlorothiazide (HYDRODIURIL) 25 MG tablet Take 12.5 mg by mouth every morning.      . mupirocin ointment (BACTROBAN) 2 %       . Psyllium (METAMUCIL PO) Take 1 packet by mouth daily.       Marland Kitchen tetrahydrozoline 0.05 % ophthalmic solution Place 1 drop into both eyes as needed (allergies/red eyes). VISINE      . warfarin (COUMADIN) 5 MG tablet Take 1.5 tablets on Monday,Wednesday, & Friday (7.5mg ); Take 1 tablet on all other days.  40 tablet  2   No current facility-administered medications on file prior to visit.    Allergies: Allergies  Allergen Reactions  . Other     SOME ADHESIVES CAUSE SKIN IRRITATION  - STATES PAPER TAPE OK.  STATES SOME EKG ELECTRODES AND SOME SKIN PATCHES ALSO CAUSE IRRITATION    FH: Family History  Problem Relation Age of Onset  . Cancer Mother     unknown type but patient thinks it was bone marrow cancer  . Heart disease Sister   . Heart disease Brother   . Clotting disorder Brother   . Colon cancer Neg Hx   . Stomach cancer Neg Hx     SH: History   Social History  . Marital Status: Single    Spouse Name: N/A  Number of Children: N/A  . 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 Smoking status: Current Some Day Smoker -- 0.25 packs/day for 40 years    Types: Cigarettes  . Smokeless tobacco: Never Used  . Alcohol Use: No  . Drug Use: No  . Sexual Activity: None   Other Topics Concern  . None   Social History Narrative   Patient does not regular exercise. He has three boys. Daily caffeine use 2/day.    Review of Systems: Constitutional: Negative for fever, chills and weight loss.  Eyes: Negative for blurred vision.  Respiratory: Negative for cough and shortness of breath.  Cardiovascular: Negative for chest pain, palpitations and leg swelling.  Gastrointestinal: Negative for nausea, vomiting, abdominal pain, diarrhea, constipation and blood in stool.  Genitourinary:  Negative for dysuria, urgency and frequency.  Musculoskeletal: Negative for myalgias and back pain.  Neurological: Negative for dizziness, weakness and headaches.     Objective:   Vital Signs: Filed Vitals:   06/24/14 1658  BP: 139/71  Pulse: 88  Temp: 98.2 F (36.8 C)  TempSrc: Oral  Height: 5\' 6"  (1.676 m)  Weight: 277 lb 1.6 oz (125.692 kg)  SpO2: 100%     BP Readings from Last 3 Encounters:  06/24/14 139/71  05/24/14 135/57  05/11/14 137/85    Physical Exam: Constitutional: Vital signs reviewed.  Patient is well-developed and well-nourished in NAD and cooperative with exam.  Head: Normocephalic and atraumatic. Eyes: PERRL, EOMI, conjunctivae nl, no scleral icterus.  Neck: Supple. Cardiovascular: RRR, no MRG. Pulmonary/Chest: normal effort, non-tender to palpation, CTAB, no wheezes, rales, or rhonchi. Abdominal: Obese. Soft. NT/ND +BS. Neurological: A&O x3, cranial nerves II-XII are grossly intact, moving all extremities. Extremities: 2+DP b/l; no pitting edema. Skin: Warm, dry and intact. No rash.   Assessment & Plan:   Assessment and plan was discussed and formulated with my attending.

## 2014-06-24 NOTE — Assessment & Plan Note (Addendum)
Pt still complains of bilateral knee pain and has received steroid injections in both knees.  Reports that it helped with the R knee but not much on the L knee.  She is using heat/cold pads on the knees which she says helps some.  She is taking tylenol daily (reports taking 8-10 tabs of ES).  I told her to make sure she takes not more than 4000mg  per day.  I considered voltaren gel but was not sure if this would interact with her coumadin.  She has an appt with SM on Friday and may be able to get hyaluronic acid injections also.  We may also consider chondroitin and glucosamine to see if this helps. -continue to follow up with SM -tylenol not to exceed 4000mg /daily -consider hyaluronic acid injections -could also consider chondroitin and glucosamine -also encouraged weight loss and provided info on bariatric surgery  -will also refer to [facility] for weight management

## 2014-06-24 NOTE — Assessment & Plan Note (Signed)
-  pt reports quitting smoking and I congratulated her on this

## 2014-06-24 NOTE — Assessment & Plan Note (Signed)
-  provided pamphlet on bariatric surgery -referral to [facility], CDE for weight management

## 2014-06-24 NOTE — Patient Instructions (Signed)
Thank you for your visit today.   Please return to the internal medicine clinic in 3-4 month(s) or sooner if needed.   You may take tylenol up to 4000mg  daily for knee pain. Please keep your appointment with Dr. Nori Riis on Friday.    Please be sure to bring all of your medications with you to every visit; this includes herbal supplements, vitamins, eye drops, and any over-the-counter medications.   Should you have any questions regarding your medications and/or any new or worsening symptoms, please be sure to call the clinic at [phone].   If you believe that you are suffering from a life threatening condition or one that may result in the loss of limb or function, then you should call 911 or proceed to the nearest Emergency Department.     A healthy lifestyle and preventative care can promote health and wellness.   Maintain regular health, dental, and eye exams.  Eat a healthy diet. Foods like vegetables, fruits, whole grains, low-fat dairy products, and lean protein foods contain the nutrients you need without too many calories. Decrease your intake of foods high in solid fats, added sugars, and salt. Get information about a proper diet from your caregiver, if necessary.  Regular physical exercise is one of the most important things you can do for your health. Most adults should get at least 150 minutes of moderate-intensity exercise (any activity that increases your heart rate and causes you to sweat) each week. In addition, most adults need muscle-strengthening exercises on 2 or more days a week.   Maintain a healthy weight. The body mass index (BMI) is a screening tool to identify possible weight problems. It provides an estimate of body fat based on height and weight. Your caregiver can help determine your BMI, and can help you achieve or maintain a healthy weight. For adults 20 years and older:  A BMI below 18.5 is considered underweight.  A BMI of 18.5 to 24.9 is normal.  A  BMI of 25 to 29.9 is considered overweight.  A BMI of 30 and above is considered obese.

## 2014-06-24 NOTE — Assessment & Plan Note (Signed)
-  influenza vaccine given today

## 2014-06-24 NOTE — Assessment & Plan Note (Signed)
-  continue current meds of 12.5mg  HCTZ

## 2014-06-24 NOTE — Assessment & Plan Note (Addendum)
-  most recent INR on 06/17/14 subtherapeutic but had not had INR checked since June and missed her last appt with Dr. Elie Confer -continue coumadin

## 2014-06-25 NOTE — Progress Notes (Signed)
INTERNAL MEDICINE TEACHING ATTENDING ADDENDUM - Anthany Thornhill, MD: I reviewed and discussed at the time of visit with the resident Dr. Gill, the patient's medical history, physical examination, diagnosis and results of pertinent tests and treatment and I agree with the patient's care as documented.  

## 2014-06-28 ENCOUNTER — Ambulatory Visit (INDEPENDENT_AMBULATORY_CARE_PROVIDER_SITE_OTHER): Payer: BC Managed Care – PPO | Admitting: Family Medicine

## 2014-06-28 ENCOUNTER — Encounter: Payer: Self-pay | Admitting: Family Medicine

## 2014-06-28 VITALS — BP 153/75 | Ht 66.0 in | Wt 275.0 lb

## 2014-06-28 DIAGNOSIS — M170 Bilateral primary osteoarthritis of knee: Secondary | ICD-10-CM | POA: Diagnosis not present

## 2014-06-28 MED ORDER — METHYLPREDNISOLONE ACETATE 40 MG/ML IJ SUSP
40.0000 mg | Freq: Once | INTRAMUSCULAR | Status: AC
  Administered 2014-06-28: 40 mg via INTRA_ARTICULAR

## 2014-06-28 NOTE — Assessment & Plan Note (Signed)
Left knee corticosteroid injection today. She had one in August it did not seem to help much. We will try one more. This does not help then I would recommend consideration of total knee replacement and discussed this with her at length. She'll let us know in a month if this helped or if we need to refer her to orthopedics. She can does give [place] a call.

## 2014-06-28 NOTE — Progress Notes (Signed)
Patient ID: Alicia Martinez, female   DOB: 1956/09/19, 58 y.o.   MRN: [id number]  TARAN HABLE - 58 y.o. female MRN [id number]  Date of birth: 10-19-1955    SUBJECTIVE:      Knee pain with known osteoarthritis of bilateral knees, but today the left knee is .hurting more than the right knee. We did an injection in the left knee in August and it did not seem to help much. She a had a right knee injection recently that did seem to improve things. She's been having some mild locking symptoms and pain with stair climbing. She's also having pain awakens her at night.  ROS:     knee pain as above. No fever, sweats, chills, unusual weight change. No calf pain. Noted no erythema or swelling of joints.  PERTINENT  PMH / PSH FH / / SH:  Past Medical, Surgical, Social, and Family History Reviewed & Updated in the EMR.  Pertinent findings include:  History of rectal adenocarcinoma, hypertension, long-term use of anticoagulants do to pulmonary embolus and recurrent DVT, OSA    OBJECTIVE: BP 153/75  Ht 5\' 6"  (1.676 m)  Wt 275 lb (124.739 kg)  BMI 44.41 kg/m2  Physical Exam:  Vital signs are reviewed. GENERAL: Well-developed female no acute distress KNEES: Bilaterally symmetrical without any sign of erythema or effusion. Left knee tender to palpation medial joint line. Full extension, full flexion.. Officeis benign. Distally neurovascularly intact.  IMAGING: Complete for view knee of July 2015, non-standing show severe tricompartmental osteoarthritis.  INJECTION: Patient was given informed consent, signed copy in the chart. Appropriate time out was taken. Area prepped and draped in usual sterile fashion. One cc of methylprednisolone 40 mg/ml plus  4 cc of 1% lidocaine without epinephrine was injected into the left knee using a(n) anterior medial approach. The patient tolerated the procedure well. There were no complications. Post procedure instructions were given.   ASSESSMENT & PLAN:  See  problem based charting & AVS for pt instructions.

## 2014-07-01 ENCOUNTER — Ambulatory Visit (INDEPENDENT_AMBULATORY_CARE_PROVIDER_SITE_OTHER): Payer: BC Managed Care – PPO | Admitting: Pharmacist

## 2014-07-01 DIAGNOSIS — Z7901 Long term (current) use of anticoagulants: Secondary | ICD-10-CM | POA: Diagnosis not present

## 2014-07-01 LAB — POCT INR: INR: 2.1

## 2014-07-01 NOTE — Progress Notes (Signed)
Anti-Coagulation Progress Note  Alicia Martinez is a 58 y.o. female who is currently on an anti-coagulation regimen.    RECENT RESULTS: Recent results are below, the most recent result is correlated with a dose of 42.5 mg. per week: Lab Results  Component Value Date   INR 2.10 07/01/2014   INR 1.20 06/17/2014   INR 2.10 03/25/2014    ANTI-COAG DOSE: Anticoagulation Dose Instructions as of 07/01/2014     [name] Tue Wed Thu Fri Sat   New Dose 5 mg 7.5 mg 7.5 mg 7.5 mg 5 mg 7.5 mg 5 mg       ANTICOAG SUMMARY: Anticoagulation Episode Summary   Current INR goal 2.0-3.0  Next INR check 07/22/2014  INR from last check 2.10 (07/01/2014)  Weekly max dose   Target end date Indefinite  [facility]  Preferred lab   Send INR reminders to    Indications  DEEP VENOUS THROMBOPHLEBITIS RECURRENT (Resolved) [I82.409] Long term current use of anticoagulants due to h/o PE and recurrent DVT with INR goal of 2.0-3.0 [Z79.01]        Comments         ANTICOAG TODAY: Anticoagulation Summary as of 07/01/2014   INR goal 2.0-3.0  Selected INR 2.10 (07/01/2014)  Next INR check 07/22/2014  Target end date Indefinite   Indications  DEEP VENOUS THROMBOPHLEBITIS RECURRENT (Resolved) [I82.409] Long term current use of anticoagulants due to h/o PE and recurrent DVT with INR goal of 2.0-3.0 [Z79.01]      Anticoagulation Episode Summary   [facility]   Preferred lab    Send INR reminders to    Comments       PATIENT INSTRUCTIONS: Patient Instructions   Patient instructed to take medications as defined in the Anti-coagulation Track section of this encounter.  Patient instructed to take today's dose.  Patient verbalized understanding of these instructions.       FOLLOW-UP Return in 3 weeks (on 07/22/2014) for Follow up INR at 3:30PM.  Jorene Guest, [name] Pharm.D., CACP

## 2014-07-01 NOTE — Patient Instructions (Addendum)
Patient instructed to take medications as defined in the Anti-coagulation Track section of this encounter.  Patient instructed to take today's dose.  Patient verbalized understanding of these instructions.    

## 2014-07-08 NOTE — Progress Notes (Signed)
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  Her INR is 2.1.  Her coumadin was increased.  I have reviewed Dr. Gladstone Pih note.

## 2014-07-09 ENCOUNTER — Ambulatory Visit (HOSPITAL_COMMUNITY)
Admission: RE | Admit: 2014-07-09 | Discharge: 2014-07-09 | Disposition: A | Discharge status: Home / Self Care | Payer: BC Managed Care – PPO | Source: Ambulatory Visit | Attending: Internal Medicine | Admitting: Internal Medicine

## 2014-07-09 DIAGNOSIS — Z1231 Encounter for screening mammogram for malignant neoplasm of breast: Secondary | ICD-10-CM | POA: Diagnosis not present

## 2014-07-22 ENCOUNTER — Ambulatory Visit (INDEPENDENT_AMBULATORY_CARE_PROVIDER_SITE_OTHER): Payer: BC Managed Care – PPO | Admitting: Internal Medicine

## 2014-07-22 ENCOUNTER — Ambulatory Visit (INDEPENDENT_AMBULATORY_CARE_PROVIDER_SITE_OTHER): Payer: BC Managed Care – PPO | Admitting: Pharmacist

## 2014-07-22 ENCOUNTER — Encounter: Payer: Self-pay | Admitting: Internal Medicine

## 2014-07-22 VITALS — BP 108/60 | HR 81 | Temp 98.3°F | Ht 66.0 in | Wt 277.5 lb

## 2014-07-22 DIAGNOSIS — I10 Essential (primary) hypertension: Secondary | ICD-10-CM | POA: Diagnosis not present

## 2014-07-22 DIAGNOSIS — Z87891 Personal history of nicotine dependence: Secondary | ICD-10-CM

## 2014-07-22 DIAGNOSIS — M170 Bilateral primary osteoarthritis of knee: Secondary | ICD-10-CM

## 2014-07-22 DIAGNOSIS — Z7901 Long term (current) use of anticoagulants: Secondary | ICD-10-CM | POA: Diagnosis not present

## 2014-07-22 LAB — POCT INR
INR: 3
INR: 3

## 2014-07-22 NOTE — Progress Notes (Signed)
Patient ID: Alicia Martinez, female   DOB: Feb 07, 1956, 58 y.o.   MRN: [id number]    Subjective:   Patient ID: Alicia Martinez female    DOB: August 23, 1956 58 y.o.    MRN: [id number] Health Maintenance Due: Health Maintenance Due  Topic Date Due  . Tetanus/tdap  07/05/1977    _________________________________________________  HPI: Alicia Martinez is a 58 y.o. female here for a routine visit.  Pt has a PMH outlined below.  Please see problem-based charting assessment and plan note for further details of medical issues addressed at today's visit.  PMH: Past Medical History  Diagnosis Date  . Deep vein thrombosis     left lower extremity  . Pulmonary embolism     LAST ONE WAS 1995- ON CHRONIC COUMADIN  . Allergy   . Anemia   . Hypertension   . History of rectal cancer     T1N0 resected 08/2009  . Diverticulitis large intestine   . Obesity   . Infectious colitis   . Sleep apnea 08/09/2012    Sleep study 02/2013 : Severe obstructive sleep apnea/hypopnea syndrome, AHI 88.7, desat to 84%. CPAP titration to 15 CWP, AHI 0 per hour. Medium ResMed Mirage Quattro full-face mask with heated humidifier.      . Pain     LOWER BACK AND RT KNEE AND BOTH SHOULDERS - LEFT SHOULDER PAIN WORSE--PT TOLD ARTHRITIS  . Arthritis     DJD, ARTHRITIS  . Heart palpitations     HX OF PALPITATIONS AND SOME DIZZINESS - EVALUATED BY CARDIOLOGIST DR. ROSS - OFFICE NOTES IS EPIC 08/24/13 - "EVENT MONITOR SHOWED NO SIGNIFICANT ARRHYTHMIA"  . Carotid artery plaque 08-30-13    PER CAROTID DOPPLER STUDY    Medications: Current Outpatient Prescriptions on File Prior to Visit  Medication Sig Dispense Refill  . aspirin 81 MG tablet Take 81 mg by mouth daily.      . capsicum (MUSCLE RELIEF) 0.075 % topical cream Apply 1 application topically 2 (two) times daily as needed (muscle pain).      . hydrochlorothiazide (HYDRODIURIL) 25 MG tablet Take 12.5 mg by mouth every morning.      . Psyllium (METAMUCIL  PO) Take 1 packet by mouth daily.       Marland Kitchen tetrahydrozoline 0.05 % ophthalmic solution Place 1 drop into both eyes as needed (allergies/red eyes). VISINE      . warfarin (COUMADIN) 5 MG tablet Take 1.5 tablets on Monday,Wednesday, & Friday (7.5mg ); Take 1 tablet on all other days.  40 tablet  2   No current facility-administered medications on file prior to visit.    Allergies: Allergies  Allergen Reactions  . Other     SOME ADHESIVES CAUSE SKIN IRRITATION  - STATES PAPER TAPE OK.  STATES SOME EKG ELECTRODES AND SOME SKIN PATCHES ALSO CAUSE IRRITATION    FH: Family History  Problem Relation Age of Onset  . Cancer Mother     unknown type but patient thinks it was bone marrow cancer  . Heart disease Sister   . Heart disease Brother   . Clotting disorder Brother   . Colon cancer Neg Hx   . Stomach cancer Neg Hx     SH: History   Social History  . Marital Status: Single    Spouse Name: N/A    Number of Children: N/A  . 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 Smoking  status: Current Some Day Smoker -- 0.25 packs/day for 40 years    Types: Cigarettes  . Smokeless tobacco: Never Used  . Alcohol Use: No  . Drug Use: No  . Sexual Activity: None   Other Topics Concern  . None   Social History Narrative   Patient does not regular exercise. He has three boys. Daily caffeine use 2/day.    Review of Systems: Constitutional: Negative for fever, chills and weight loss.  Eyes: Negative for blurred vision.  Respiratory: Negative for cough and shortness of breath.  Cardiovascular: Negative for chest pain, palpitations and leg swelling.  Gastrointestinal: Negative for nausea, vomiting, abdominal pain, diarrhea, constipation and blood in stool.  Genitourinary: Negative for dysuria, urgency and frequency.  Musculoskeletal: Negative for myalgias and back pain.  Neurological: Negative for dizziness, weakness and headaches.      Objective:   Vital Signs: Filed Vitals:   07/22/14 1549  BP: 108/60  Pulse: 81  Temp: 98.3 F (36.8 C)  TempSrc: Oral  Height: 5\' 6"  (1.676 m)  Weight: 277 lb 8 oz (125.873 kg)  SpO2: 100%     BP Readings from Last 3 Encounters:  07/22/14 108/60  06/28/14 153/75  06/24/14 139/71    Physical Exam: Constitutional: Vital signs reviewed.  Patient is well-developed and well-nourished in NAD and cooperative with exam.  Head: Normocephalic and atraumatic. Eyes: PERRL, EOMI, conjunctivae nl, no scleral icterus.  Neck: Supple. Cardiovascular: RRR, no MRG. Pulmonary/Chest: normal effort, non-tender to palpation, CTAB, no wheezes, rales, or rhonchi. Abdominal: Obese. Soft. NT/ND +BS. Neurological: A&O x3, cranial nerves II-XII are grossly intact, moving all extremities. Extremities: 2+DP b/l; no pitting edema. Skin: Warm, dry and intact. No rash.  Assessment & Plan:   Assessment and plan was discussed and formulated with my attending.

## 2014-07-22 NOTE — Patient Instructions (Signed)
Thank you for your visit today.   Please return to the internal medicine clinic in 6 month(s) or sooner if needed.     Your current medical regimen is effective;  continue present plan and take all medications as prescribed.    Please be sure to bring all of your medications with you to every visit; this includes herbal supplements, vitamins, eye drops, and any over-the-counter medications.   Should you have any questions regarding your medications and/or any new or worsening symptoms, please be sure to call the clinic at [phone].   If you believe that you are suffering from a life threatening condition or one that may result in the loss of limb or function, then you should call 911 or proceed to the nearest Emergency Department.     A healthy lifestyle and preventative care can promote health and wellness.   Maintain regular health, dental, and eye exams.  Eat a healthy diet. Foods like vegetables, fruits, whole grains, low-fat dairy products, and lean protein foods contain the nutrients you need without too many calories. Decrease your intake of foods high in solid fats, added sugars, and salt. Get information about a proper diet from your caregiver, if necessary.  Regular physical exercise is one of the most important things you can do for your health. Most adults should get at least 150 minutes of moderate-intensity exercise (any activity that increases your heart rate and causes you to sweat) each week. In addition, most adults need muscle-strengthening exercises on 2 or more days a week.   Maintain a healthy weight. The body mass index (BMI) is a screening tool to identify possible weight problems. It provides an estimate of body fat based on height and weight. Your caregiver can help determine your BMI, and can help you achieve or maintain a healthy weight. For adults 20 years and older:  A BMI below 18.5 is considered underweight.  A BMI of 18.5 to 24.9 is normal.  A BMI  of 25 to 29.9 is considered overweight.  A BMI of 30 and above is considered obese.

## 2014-07-22 NOTE — Assessment & Plan Note (Signed)
-  continues to be smoke free; congratulated her on this!

## 2014-07-22 NOTE — Progress Notes (Signed)
Anti-Coagulation Progress Note  Alicia Martinez is a 58 y.o. female who is currently on an anti-coagulation regimen.    RECENT RESULTS: Recent results are below, the most recent result is correlated with a dose of 45 mg. per week: Lab Results  Component Value Date   INR 3.0 07/22/2014   INR 3.0 07/22/2014   INR 2.10 07/01/2014    ANTI-COAG DOSE: Anticoagulation Dose Instructions as of 07/22/2014     [name] Tue Wed Thu Fri Sat   New Dose 5 mg 7.5 mg 5 mg 7.5 mg 5 mg 7.5 mg 5 mg       ANTICOAG SUMMARY: Anticoagulation Episode Summary   Current INR goal 2.0-3.0  Next INR check 08/19/2014  INR from last check 3.0 (07/22/2014)  Weekly max dose   Target end date Indefinite  [facility]  Preferred lab   Send INR reminders to    Indications  DEEP VENOUS THROMBOPHLEBITIS RECURRENT (Resolved) [I82.409] Long term current use of anticoagulants due to h/o PE and recurrent DVT with INR goal of 2.0-3.0 [Z79.01]        Comments         ANTICOAG TODAY: Anticoagulation Summary as of 07/22/2014   INR goal 2.0-3.0  Selected INR 3.0 (07/22/2014)  Next INR check 08/19/2014  Target end date Indefinite   Indications  DEEP VENOUS THROMBOPHLEBITIS RECURRENT (Resolved) [I82.409] Long term current use of anticoagulants due to h/o PE and recurrent DVT with INR goal of 2.0-3.0 [Z79.01]      Anticoagulation Episode Summary   [facility]   Preferred lab    Send INR reminders to    Comments       PATIENT INSTRUCTIONS: Patient Instructions  Patient instructed to take medications as defined in the Anti-coagulation Track section of this encounter.  Patient instructed to take today's dose.  Patient verbalized understanding of these instructions.       FOLLOW-UP Return in 4 weeks (on 08/19/2014) for Follow up INR at 3:30PM.  Jorene Guest, [name] Pharm.D., CACP

## 2014-07-22 NOTE — Assessment & Plan Note (Signed)
BP Readings from Last 3 Encounters:  07/22/14 108/60  06/28/14 153/75  06/24/14 139/71    Lab Results  Component Value Date   NA 141 03/08/2014   K 4.0 03/08/2014   CREATININE 0.63 03/08/2014    Assessment: Blood pressure control: controlled Progress toward BP goal:  at goal  Plan: Medications:  continue current medications of HCTZ 25mg  daily  Other plans: follow-up in 6 months

## 2014-07-22 NOTE — Progress Notes (Signed)
Indication: Recurrent venous thromboembolism. Duration: Lifelong. INR: At target. Agree with Dr. Groce's assessment and plan. 

## 2014-07-22 NOTE — Assessment & Plan Note (Signed)
Pt continues to follow with SM for knee injections with some relief.  Also takes tylenol up to 4000mg  daily for pain relief.  She is not ready to have knee replacement surgery.  I encouraged weight loss and to follow up with Debera Lat, CDE. -continue to follow up with SM -tylenol PRN -follow-up with Debera Lat (pt not ready to see Butch Penny)

## 2014-07-22 NOTE — Patient Instructions (Signed)
Patient instructed to take medications as defined in the Anti-coagulation Track section of this encounter.  Patient instructed to take today's dose.  Patient verbalized understanding of these instructions.    

## 2014-07-23 NOTE — Progress Notes (Signed)
Case discussed with Dr. Gill soon after the resident saw the patient.  We reviewed the resident's history and exam and pertinent patient test results.  I agree with the assessment, diagnosis, and plan of care documented in the resident's note. 

## 2014-08-13 IMAGING — CR DG KNEE COMPLETE 4+V*L*
4 series · 4 of 4 positions shown · non-contrast
Comparison: None.

CLINICAL DATA: Left knee pain.  No injury.

EXAM:
LEFT KNEE - COMPLETE 4+ VIEW

[t knee ap left]
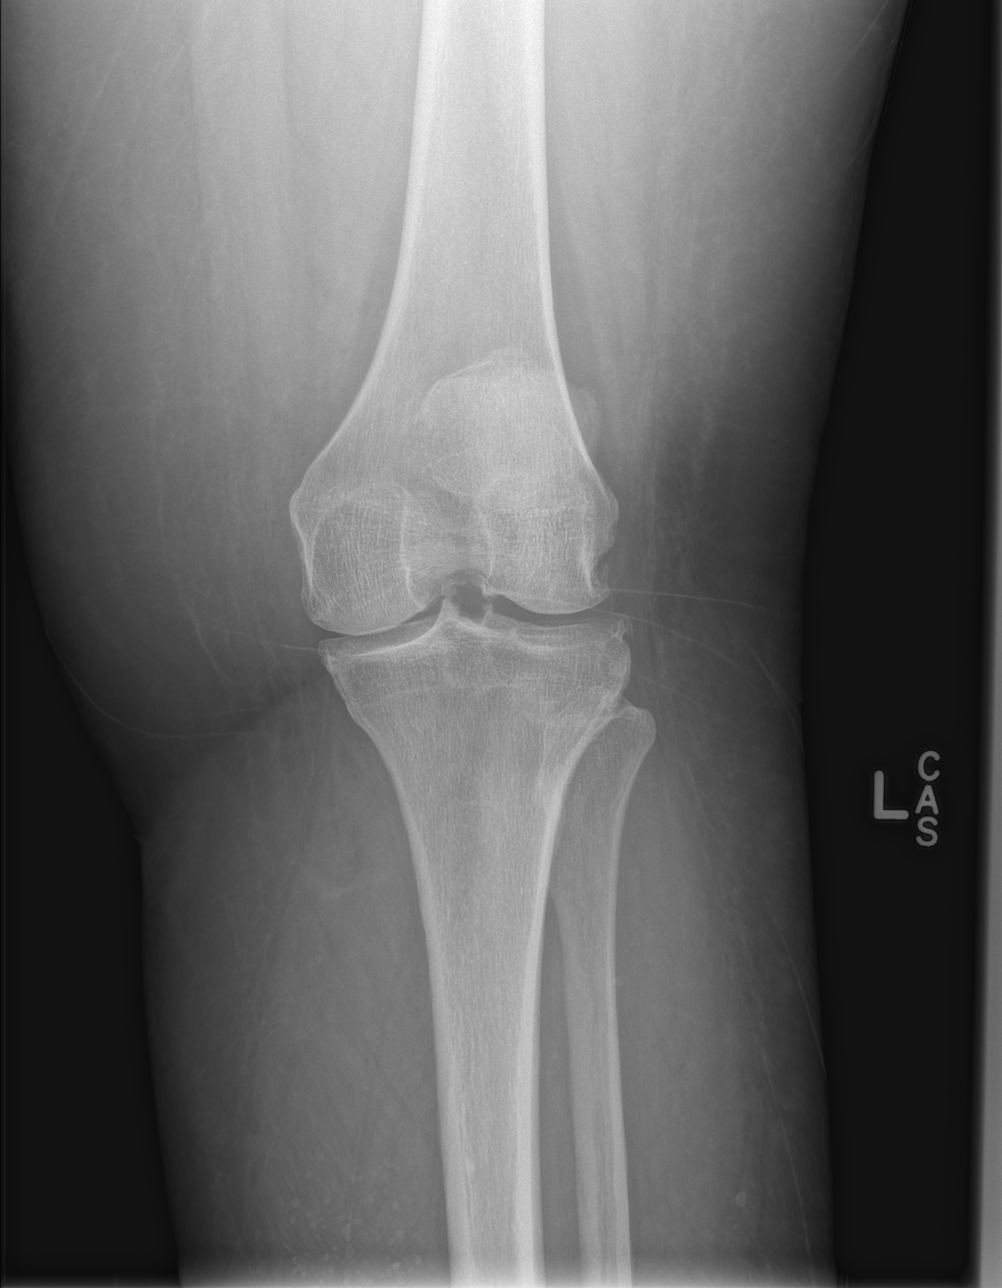

[t knee obl left (1 of 2)]
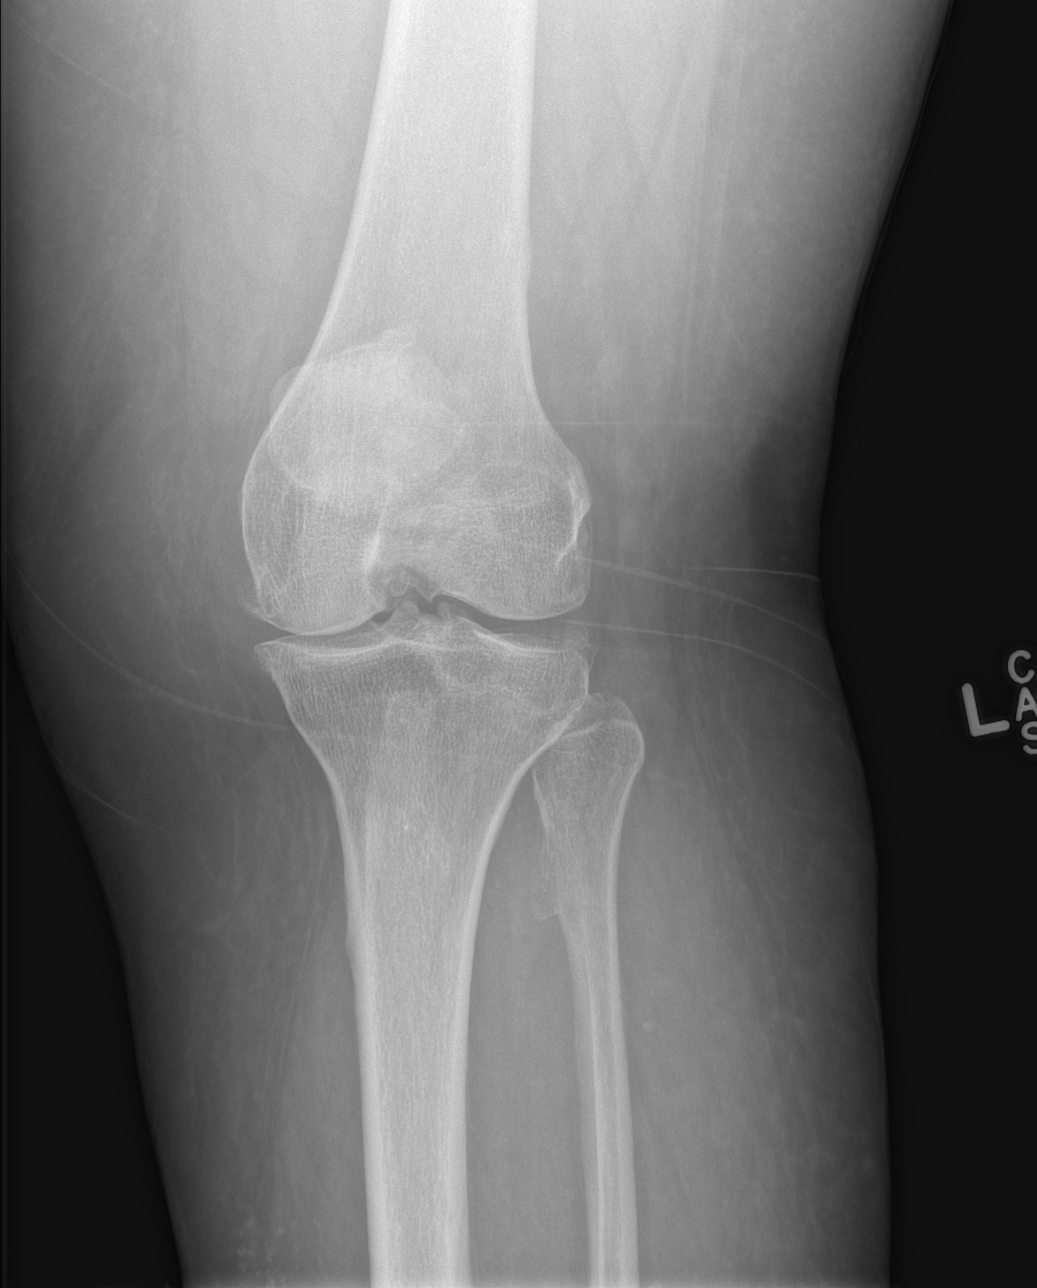

[t knee obl left (2 of 2)]
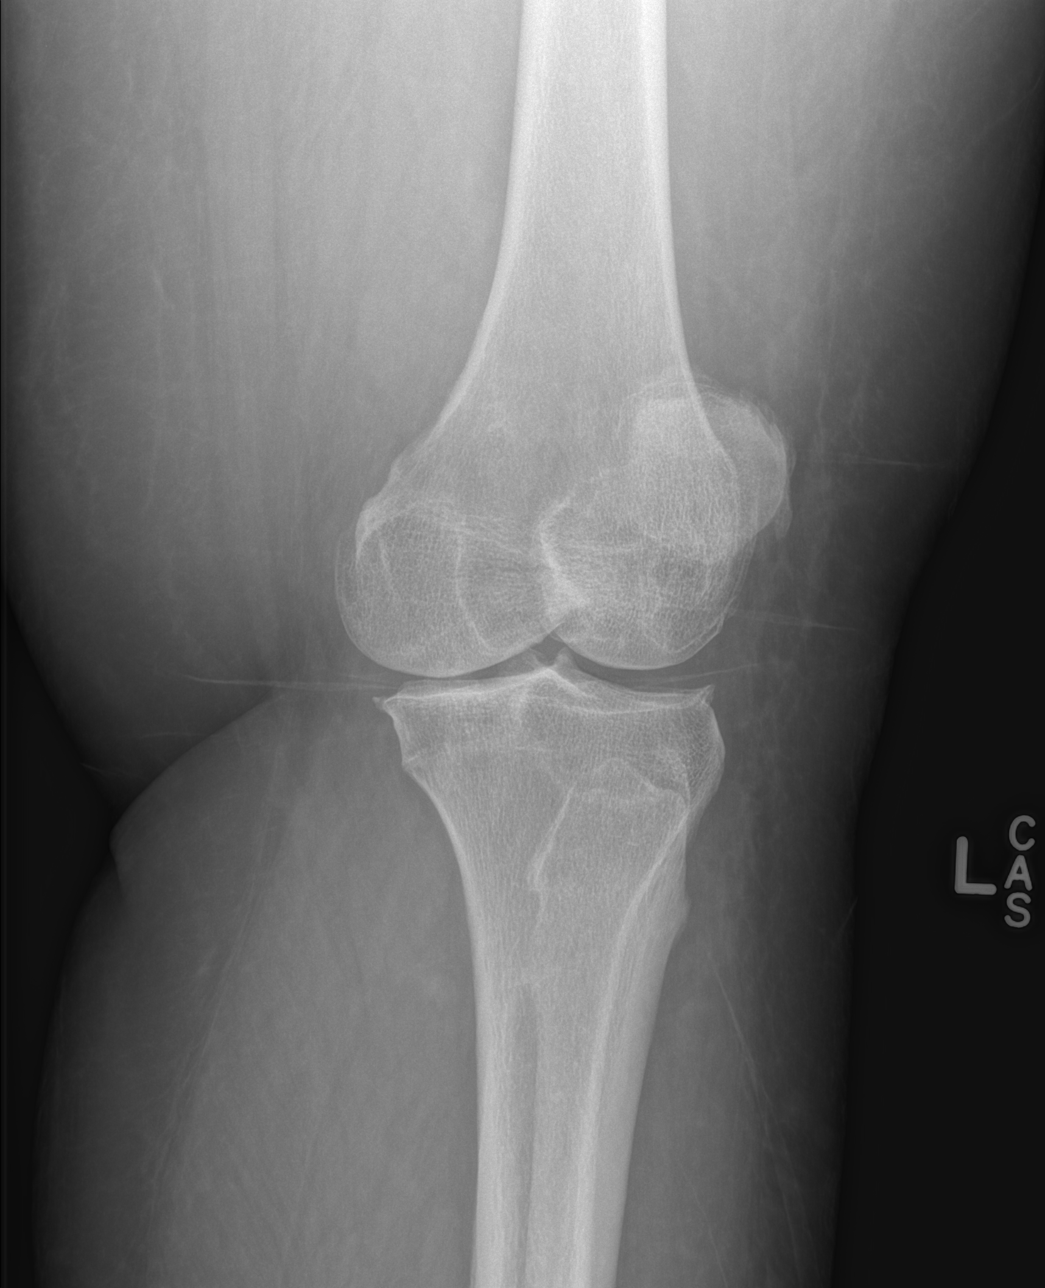

[t knee lat left]
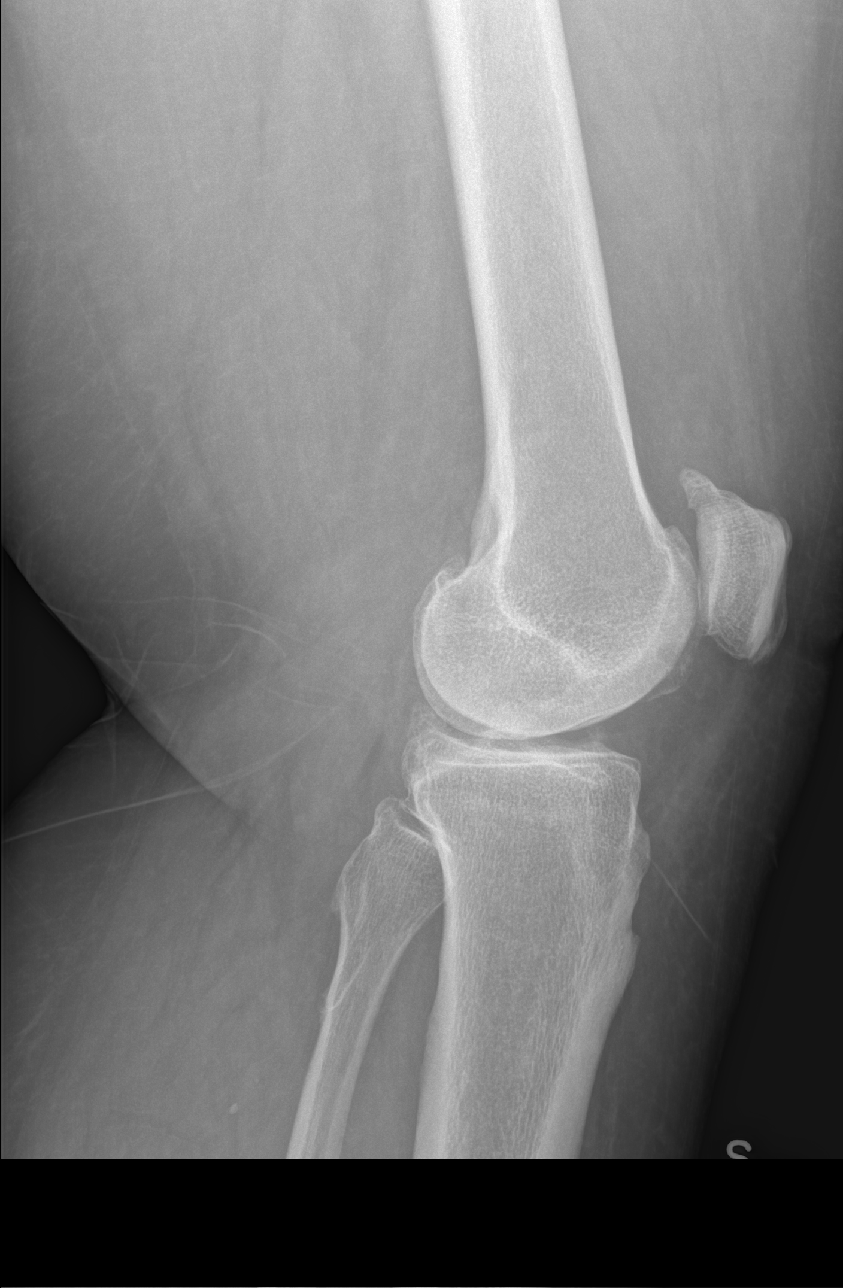

[4 of 4 positions shown; findings below may reference images not displayed]

FINDINGS: Tricompartment degenerative changes in the left knee with medial
greater than lateral compartment narrowing and prominent osteophytic
changes in all 3 compartments. No evidence of acute fracture or
dislocation in the left knee. Exostosis arising from the proximal
left fibula. No significant effusion. Soft tissues are unremarkable.
IMPRESSION: Degenerative changes in the left knee. Exostosis arising from the
proximal fibula. No acute bony abnormalities.

## 2014-08-19 ENCOUNTER — Ambulatory Visit (INDEPENDENT_AMBULATORY_CARE_PROVIDER_SITE_OTHER): Payer: BC Managed Care – PPO | Admitting: Pharmacist

## 2014-08-19 DIAGNOSIS — I82409 Acute embolism and thrombosis of unspecified deep veins of unspecified lower extremity: Secondary | ICD-10-CM | POA: Diagnosis not present

## 2014-08-19 DIAGNOSIS — Z7901 Long term (current) use of anticoagulants: Secondary | ICD-10-CM | POA: Diagnosis not present

## 2014-08-19 LAB — POCT INR: INR: 3

## 2014-08-19 NOTE — Patient Instructions (Signed)
Patient instructed to take medications as defined in the Anti-coagulation Track section of this encounter.  Patient instructed to take today's dose.  Patient verbalized understanding of these instructions.    

## 2014-08-19 NOTE — Progress Notes (Signed)
Anti-Coagulation Progress Note  Alicia Martinez is a 58 y.o. female who is currently on an anti-coagulation regimen.    RECENT RESULTS: Recent results are below, the most recent result is correlated with a dose of 42.5 mg. 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] Long term current use of anticoagulants due to h/o PE and recurrent DVT with INR goal of 2.0-3.0 [Z79.01]        Comments         ANTICOAG TODAY: Anticoagulation Summary as of 08/19/2014    INR goal 2.0-3.0  Selected INR 3.0 (08/19/2014)  Next INR check 09/09/2014  Target end date Indefinite   Indications  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  Patient instructed to take today's dose.  Patient verbalized understanding of these instructions.       FOLLOW-UP Return in 3 weeks (on 09/09/2014) for Follow up INR at 3:30PM.  Jorene Guest, [name] Pharm.D., CACP

## 2014-08-21 ENCOUNTER — Encounter: Payer: Self-pay | Admitting: Internal Medicine

## 2014-09-06 ENCOUNTER — Encounter: Payer: Self-pay | Admitting: *Deleted

## 2014-09-06 NOTE — Patient Instructions (Signed)
DR Ranee Gosselin [place]  [phone] TUES 09/10/14 AT 1145A WITH ARRIVE TIME OF 1115A

## 2014-09-09 ENCOUNTER — Ambulatory Visit: Payer: BC Managed Care – PPO

## 2014-09-16 ENCOUNTER — Ambulatory Visit: Payer: BC Managed Care – PPO

## 2014-09-17 ENCOUNTER — Other Ambulatory Visit (HOSPITAL_COMMUNITY): Payer: Self-pay | Admitting: *Deleted

## 2014-09-17 DIAGNOSIS — I6523 Occlusion and stenosis of bilateral carotid arteries: Secondary | ICD-10-CM

## 2014-09-26 ENCOUNTER — Ambulatory Visit (HOSPITAL_COMMUNITY): Payer: BC Managed Care – PPO | Attending: Cardiovascular Disease | Admitting: Cardiology

## 2014-09-26 DIAGNOSIS — I6523 Occlusion and stenosis of bilateral carotid arteries: Secondary | ICD-10-CM | POA: Insufficient documentation

## 2014-09-26 NOTE — Progress Notes (Signed)
Carotid duplex performed 

## 2014-10-02 ENCOUNTER — Other Ambulatory Visit: Payer: Self-pay | Admitting: Internal Medicine

## 2014-10-07 ENCOUNTER — Ambulatory Visit (INDEPENDENT_AMBULATORY_CARE_PROVIDER_SITE_OTHER): Payer: BLUE CROSS/BLUE SHIELD

## 2014-10-22 ENCOUNTER — Other Ambulatory Visit: Payer: Self-pay | Admitting: Internal Medicine

## 2014-10-25 ENCOUNTER — Telehealth: Payer: Self-pay | Admitting: Internal Medicine

## 2014-10-25 DIAGNOSIS — E049 Nontoxic goiter, unspecified: Secondary | ICD-10-CM

## 2014-10-25 NOTE — Telephone Encounter (Signed)
New message     Patient is returning phone call . Please call back Thanks.

## 2014-10-25 NOTE — Telephone Encounter (Signed)
LMTCB

## 2014-10-25 NOTE — Telephone Encounter (Signed)
Pt rtn call, pls call [phone]

## 2014-10-25 NOTE — Telephone Encounter (Signed)
New Msg        Pt returning call about test results from last week.     Please call back.

## 2014-10-25 NOTE — Telephone Encounter (Signed)
Pt calling with carotid duplex results from 12/31.  Informed the pt that per Dr Harrington Challenger mild plaquing of her bilateral carotids.  Informed the pt that per Dr Harrington Challenger her thyroid appears to have goiter, and she would recommend dedicated USN of thryoid to evaluate this.  Informed the pt that we will place the order in the system and send a message to our schedulers to contact the pt to have this set up at a different location, for we do not perform this type of ultrasound in our office.  Pt verbalized understanding and agrees with this plan.

## 2014-10-28 ENCOUNTER — Telehealth: Payer: Self-pay | Admitting: Cardiology

## 2014-10-28 DIAGNOSIS — E049 Nontoxic goiter, unspecified: Secondary | ICD-10-CM

## 2014-10-28 NOTE — Telephone Encounter (Signed)
New message     Request for surgical clearance:  1. What type of surgery is being performed? Thyroid biopsy  When is this surgery scheduled? 11-20-14 2. Are there any medications that need to be held prior to surgery and how long? warfarin  3. Name of physician performing surgery? [facility]  4. What is your office phone and fax number? [facility] fax is [phone]    [phone]- internal medicine has pt on coumadin but we ordered thyroid biopsy--we need to get an ok from internal medicine to stop warfarin then fax it to [facility])

## 2014-10-28 NOTE — Telephone Encounter (Signed)
I will forward to Dr Mare Ferrari for review and recommendations.

## 2014-10-28 NOTE — Telephone Encounter (Signed)
Please disregard this message. The pt does not need a thyroid biopsy at this time. The pt had a carotid duplex performed and this showed a goiter.  Per Dr Harrington Challenger the pt needs a thyroid ultrasound.  I contacted [facility] and corrected order for pt's test.  They will contact the pt with appointment for thyroid ultrasound.

## 2014-10-28 NOTE — Telephone Encounter (Signed)
This appears to be Dr. Harrington Challenger' patient.

## 2014-11-01 ENCOUNTER — Telehealth: Payer: Self-pay | Admitting: Pharmacist

## 2014-11-01 NOTE — Telephone Encounter (Signed)
Call to patient to confirm appointment for 11/04/14 at 4:00. LMTCB

## 2014-11-04 ENCOUNTER — Encounter: Payer: Self-pay | Admitting: Internal Medicine

## 2014-11-04 ENCOUNTER — Ambulatory Visit (INDEPENDENT_AMBULATORY_CARE_PROVIDER_SITE_OTHER): Payer: BLUE CROSS/BLUE SHIELD | Admitting: Internal Medicine

## 2014-11-04 ENCOUNTER — Ambulatory Visit: Payer: BLUE CROSS/BLUE SHIELD | Admitting: Pharmacist

## 2014-11-04 VITALS — BP 137/78 | HR 69 | Temp 97.9°F | Ht 66.0 in | Wt 270.0 lb

## 2014-11-04 DIAGNOSIS — R109 Unspecified abdominal pain: Secondary | ICD-10-CM | POA: Insufficient documentation

## 2014-11-04 DIAGNOSIS — M7071 Other bursitis of hip, right hip: Secondary | ICD-10-CM | POA: Insufficient documentation

## 2014-11-04 DIAGNOSIS — Z7901 Long term (current) use of anticoagulants: Secondary | ICD-10-CM

## 2014-11-04 DIAGNOSIS — R319 Hematuria, unspecified: Secondary | ICD-10-CM | POA: Diagnosis not present

## 2014-11-04 LAB — CBC WITH DIFFERENTIAL/PLATELET
Basophils Absolute: 0 10*3/uL (ref 0.0–0.1)
Basophils Relative: 0 % (ref 0–1)
Eosinophils Absolute: 0.1 10*3/uL (ref 0.0–0.7)
Eosinophils Relative: 2 % (ref 0–5)
HCT: 40 % (ref 36.0–46.0)
HEMOGLOBIN: 13.2 g/dL (ref 12.0–15.0)
Lymphocytes Relative: 50 % — ABNORMAL HIGH (ref 12–46)
Lymphs Abs: 2.5 10*3/uL (ref 0.7–4.0)
MCH: 29.8 pg (ref 26.0–34.0)
MCHC: 33 g/dL (ref 30.0–36.0)
MCV: 90.3 fL (ref 78.0–100.0)
MONO ABS: 0.3 10*3/uL (ref 0.1–1.0)
MONOS PCT: 6 % (ref 3–12)
Neutro Abs: 2.1 10*3/uL (ref 1.7–7.7)
Neutrophils Relative %: 42 % — ABNORMAL LOW (ref 43–77)
Platelets: 294 10*3/uL (ref 150–400)
RBC: 4.43 MIL/uL (ref 3.87–5.11)
RDW: 14.4 % (ref 11.5–15.5)
WBC: 5 10*3/uL (ref 4.0–10.5)

## 2014-11-04 LAB — POCT URINALYSIS DIPSTICK
BILIRUBIN UA: NEGATIVE
GLUCOSE UA: NEGATIVE
KETONES UA: NEGATIVE
Nitrite, UA: NEGATIVE
PH UA: 5.5
Protein, UA: NEGATIVE
Spec Grav, UA: >=1.03
Urobilinogen, UA: 0.2

## 2014-11-04 LAB — BASIC METABOLIC PANEL
Anion gap: 6 (ref 5–15)
BUN: 12 mg/dL (ref 6–23)
CALCIUM: 9.3 mg/dL (ref 8.4–10.5)
CO2: 31 mmol/L (ref 19–32)
Chloride: 102 mmol/L (ref 96–112)
Creatinine, Ser: 0.75 mg/dL (ref 0.50–1.10)
GFR calc Af Amer: >90 mL/min (ref >90)
Glucose, Bld: 105 mg/dL — ABNORMAL HIGH (ref 70–99)
Potassium: 3.4 mmol/L — ABNORMAL LOW (ref 3.5–5.1)
Sodium: 139 mmol/L (ref 135–145)

## 2014-11-04 MED ORDER — CIPROFLOXACIN HCL 500 MG PO TABS: 500.0000 mg | ORAL_TABLET | Freq: Two times a day (BID) | ORAL | Status: AC

## 2014-11-04 NOTE — Assessment & Plan Note (Addendum)
Likely in the setting of acute cystitis. Pt has had some urinary frequency and urine dip showed + leuk, +blood but - nitrite. No CVA tenderness or other systemic signs to be concerned for pyelonephritis at this time.  -treat with cirpofloxacin 500mg  qd x 7 days (this is known to interact with coumadin and the pt was educated about this and care was corordinated between pharmacist and pt and this provider in terms of dosing changes) -UCx, Bmet, CBC with diff (given coumadin status)  -f/u in 3 days for INR recheck

## 2014-11-04 NOTE — Assessment & Plan Note (Signed)
This is likely etiology of acute right flank pain. Pt is ttp over right trochanteric bursa and has artery had symptomatic relief with ibuprofen. The pain is exacerbated with movement and better with rest. -Supportive care

## 2014-11-04 NOTE — Progress Notes (Signed)
Subjective:   Patient ID: Alicia Martinez female   DOB: Apr 15, 1956 59 y.o.   MRN: [id number]  HPI: Alicia Martinez is a 59 y.o. woman pmh as listed below presents for acute right hip pain.  Patient states that about 1 week ago she developed some lateral right hip pain that preceded her current hematuria. Her pain is sharp in nature doesn't radiate anywhere, worse with ambulation and ibuprofen provides some relief. Then for the last 2- 3 days she has been having some increased urinary frequency but no pyuria and on occasion some darker color urine. She has not had any new sexual contacts, vaginal discharge, vaginal bleeding, rectal bleeding, nausea/vomiting/diarrhea/constipation, fever or chills. She has not had any history of kidney stones in the past and no other family members have had a history of kidney stones as well. She is not having any change in her gait but given her chronic left knee pain she feels she has been favoring her right side when walking recently.   Past Medical History  Diagnosis Date  . Deep vein thrombosis     left lower extremity  . Pulmonary embolism     LAST ONE WAS 1995- ON CHRONIC COUMADIN  . Allergy   . Anemia   . Hypertension   . History of rectal cancer     T1N0 resected 08/2009  . Diverticulitis large intestine   . Obesity   . Infectious colitis   . Sleep apnea 08/09/2012    Sleep study 02/2013 : Severe obstructive sleep apnea/hypopnea syndrome, AHI 88.7, desat to 84%. CPAP titration to 15 CWP, AHI 0 per hour. Medium ResMed Mirage Quattro full-face mask with heated humidifier.      . Pain     LOWER BACK AND RT KNEE AND BOTH SHOULDERS - LEFT SHOULDER PAIN WORSE--PT TOLD ARTHRITIS  . Arthritis     DJD, ARTHRITIS  . Heart palpitations     HX OF PALPITATIONS AND SOME DIZZINESS - EVALUATED BY CARDIOLOGIST DR. ROSS - OFFICE NOTES IS EPIC 08/24/13 - "EVENT MONITOR SHOWED NO SIGNIFICANT ARRHYTHMIA"  . Carotid artery plaque 08-30-13    PER CAROTID  DOPPLER STUDY   Current Outpatient Prescriptions  Medication Sig Dispense Refill  . aspirin 81 MG tablet Take 81 mg by mouth daily.    . capsicum (MUSCLE RELIEF) 0.075 % topical cream Apply 1 application topically 2 (two) times daily as needed (muscle pain).    . ciprofloxacin (CIPRO) 500 MG tablet Take 1 tablet (500 mg total) by mouth 2 (two) times daily. 14 tablet 0  . hydrochlorothiazide (HYDRODIURIL) 25 MG tablet TAKE 1/2 TABLET BY MOUTH DAILY FOR BLOOD PRESSURE. 30 tablet 0  . Psyllium (METAMUCIL PO) Take 1 packet by mouth daily.     Marland Kitchen tetrahydrozoline 0.05 % ophthalmic solution Place 1 drop into both eyes as needed (allergies/red eyes). VISINE    . warfarin (COUMADIN) 5 MG tablet TAKE 1 1/2 TABLETS BY MOUTH ON MONDAY, WEDNESDAY AND FRIDAY. TAKE 1 TABLET BY MOUTH ON ALL OTHER DAYS 40 tablet 0   No current facility-administered medications for this visit.   Family History  Problem Relation Age of Onset  . Cancer Mother     unknown type but patient thinks it was bone marrow cancer  . Heart disease Sister   . Heart disease Brother   . Clotting disorder Brother   . Colon cancer Neg Hx   . Stomach cancer Neg Hx    History   Social History  .  Marital Status: Single    Spouse Name: N/A    Number of Children: N/A  . 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 Smoking status: Current Some Day Smoker -- 0.25 packs/day for 40 years    Types: Cigarettes  . Smokeless tobacco: Never Used  . Alcohol Use: No  . Drug Use: No  . Sexual Activity: Not on file   Other Topics Concern  . Not on file   Social History Narrative   Patient does not regular exercise. He has three boys. Daily caffeine use 2/day.   Review of Systems: Pertinent items are noted in HPI. Objective:  Physical Exam: BP: 137/78  HR: 69  O2 sat%: 99 on RA    General: sitting in chair, NAD Cardiac: RRR, no rubs, murmurs or gallops Pulm: clear to  auscultation bilaterally, moving normal volumes of air Abd: soft, nontender, nondistended, BS present, no CVA tenderness, no suprapubic tenderness Ext: warm and well perfused, no pedal edema, no paraspinal tenderness or tightness, ttp over right hip bursa no SI joint pain, right side favored gait, FROM of hips bilaterally Neuro: alert and oriented X3, cranial nerves II-XII grossly intact  Assessment & Plan:  Please see problem oriented charting  Pt discussed with Dr. Ellwood Dense

## 2014-11-04 NOTE — Patient Instructions (Signed)
General Instructions:   Please try to bring all your medicines next time. This will help [place] keep you safe from mistakes.   Progress Toward Treatment Goals:  Treatment Goal 07/22/2014  Blood pressure at goal  Stop smoking -    Self Care Goals & Plans:  Self Care Goal 11/04/2014  Manage my medications take my medicines as prescribed; bring my medications to every visit  Monitor my health -  Eat healthy foods drink diet soda or water instead of juice or soda; eat more vegetables; eat foods that are low in salt; eat baked foods instead of fried foods; eat fruit for snacks and desserts  Be physically active -  Stop smoking -  Meeting treatment goals -    No flowsheet data found.   Care Management & Community Referrals:  Referral 12/31/2013  Referrals made for care management support none needed  Referrals made to community resources none       Hip Bursitis Bursitis is a swelling and soreness (inflammation) of a fluid-filled sac (bursa). This sac overlies and protects the joints.  CAUSES   Injury.  Overuse of the muscles surrounding the joint.  Arthritis.  Gout.  Infection.  Cold weather.  Inadequate warm-up and conditioning prior to activities. The cause may not be known.  SYMPTOMS   Mild to severe irritation.  Tenderness and swelling over the outside of the hip.  Pain with motion of the hip.  If the bursa becomes infected, a fever may be present. Redness, tenderness, and warmth will develop over the hip. Symptoms usually lessen in 3 to 4 weeks with treatment, but can come back. TREATMENT If conservative treatment does not work, your caregiver may advise draining the bursa and injecting cortisone into the area. This may speed up the healing process. This may also be used as an initial treatment of choice. HOME CARE INSTRUCTIONS   Apply ice to the affected area for 15-20 minutes every 3 to 4 hours while awake for the first 2 days. Put the ice in a plastic bag  and place a towel between the bag of ice and your skin.  Rest the painful joint as much as possible, but continue to put the joint through a normal range of motion at least 4 times per day. When the pain lessens, begin normal, slow movements and usual activities to help prevent stiffness of the hip.  Only take over-the-counter or prescription medicines for pain, discomfort, or fever as directed by your caregiver.  Use crutches to limit weight bearing on the hip joint, if advised.  Elevate your painful hip to reduce swelling. Use pillows for propping and cushioning your legs and hips.  Gentle massage may provide comfort and decrease swelling. SEEK IMMEDIATE MEDICAL CARE IF:   Your pain increases even during treatment, or you are not improving.  You have a fever.  You have heat and inflammation over the involved bursa.  You have any other questions or concerns. MAKE SURE YOU:   Understand these instructions.  Will watch your condition.  Will get help right away if you are not doing well or get worse. Document Released: 03/05/2002 Document Revised: 12/06/2011 Document Reviewed: 10/02/2008 [facility] Patient Information 2015 [place], [place]. This information is not intended to replace advice given to you by your health care provider. Make sure you discuss any questions you have with your health care provider.

## 2014-11-04 NOTE — Progress Notes (Signed)
Internal Medicine Clinic Attending  Case discussed with Dr. Sadek at the time of the visit.  We reviewed the resident's history and exam and pertinent patient test results.  I agree with the assessment, diagnosis, and plan of care documented in the resident's note.  

## 2014-11-05 LAB — URINALYSIS, ROUTINE W REFLEX MICROSCOPIC
Bilirubin Urine: NEGATIVE
GLUCOSE, UA: NEGATIVE mg/dL
Ketones, ur: NEGATIVE mg/dL
Nitrite: NEGATIVE
PH: 5 (ref 5.0–8.0)
PROTEIN: NEGATIVE mg/dL
Specific Gravity, Urine: 1.023 (ref 1.005–1.030)
Urobilinogen, UA: 0.2 mg/dL (ref 0.0–1.0)

## 2014-11-05 LAB — URINALYSIS, MICROSCOPIC ONLY: Crystals: NONE SEEN

## 2014-11-06 ENCOUNTER — Ambulatory Visit (INDEPENDENT_AMBULATORY_CARE_PROVIDER_SITE_OTHER): Payer: BLUE CROSS/BLUE SHIELD | Admitting: Pharmacist

## 2014-11-06 DIAGNOSIS — Z7901 Long term (current) use of anticoagulants: Secondary | ICD-10-CM

## 2014-11-06 DIAGNOSIS — I82409 Acute embolism and thrombosis of unspecified deep veins of unspecified lower extremity: Secondary | ICD-10-CM | POA: Diagnosis not present

## 2014-11-06 LAB — POCT INR: INR: 3.3

## 2014-11-06 MED ORDER — WARFARIN SODIUM 5 MG PO TABS: 5.0000 mg | ORAL_TABLET | Freq: Every day | ORAL | Status: DC

## 2014-11-06 NOTE — Progress Notes (Signed)
CLINICAL PHARMACY ANTICOAGULATION MANAGEMENT Alicia Martinez is a 59 y.o. female who reports to the clinic for monitoring of warfarin treatment.    Indication: PEand DVT history Duration: indefinite  Anticoagulation Clinic Visit History: Anticoagulation Episode Summary    Current INR goal 2.0-3.0  Next INR check 11/11/2014  INR from last check 3.3! 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] Long term current use of anticoagulants due to h/o PE and recurrent DVT with INR goal of 2.0-3.0 [Z79.01]        Comments        ASSESSMENT Recent Results: Recent results are below, the most recent result is correlated with a dose of 40 mg per week: Lab Results  Component Value Date   INR 3.3 11/06/2014   INR 3.0 08/19/2014   INR 3.0 07/22/2014    INR today: Supratherapeutic Findings: Nose Bleeds  Anticoagulation Dosing: INR as of 11/06/2014 and Previous Dosing Information    INR Dt INR Goal Wkly Tot Sun Mon Tue Wed Thu Fri Sat   11/06/2014 3.3 2.0-3.0 40 mg 5 mg 7.5 mg 5 mg 5 mg 5 mg 7.5 mg 5 mg   Patient deviated from recommended dosing.       Anticoagulation Dose Instructions as of 11/06/2014      Total Sun Mon Tue Wed Thu Fri Sat   New Dose 35 mg 5 mg 5 mg 5 mg 5 mg 5 mg 5 mg 5 mg     (5 mg x 1)  (5 mg x 1)  (5 mg x 1)  (5 mg x 1)  (5 mg x 1)  (5 mg x 1)  (5 mg x 1)                         Description        Patient instructed to hold x 1 dose today, then decrease to 1/5 tablet daily x 2 doses (Thurs 11/07/14 and Fri 11/08/14). Note:  Patient to have arthroscopic surgery on Wednesday November 13, 2014. Will hold warfarin for 5 days, starting on Saturday November 09, 2014 and through Wednesday November 13, 2014. Due to patient's previous history of multiple VTE events (PE x 1, DVT x 4) we elect to bridge  the patient with Lovenox 120mg  (~1mg /kg) SQ q12h, starting Saturday February 13 at 8pm.       PLAN Weekly dose was decreased by 12% to 35 mg per week to start after surgery.  Patient Instructions: Patient Instructions  Patient educated about medication dosing as defined in this encounter and verbalized understanding by repeating back instructions provided.    Follow-up Return in about 5 days (around 11/11/2014) for Follow up INR 11/11/14 at 3pm. in preparation of upcoming procedure, considering bleeding symptoms and antibiotic therapy, as well as for reinforcement of post-op warfarin dosing instructions.  Flossie Dibble, PharmD BCPS, BCACP

## 2014-11-06 NOTE — Patient Instructions (Signed)
Patient educated about medication dosing as defined in this encounter and verbalized understanding by repeating back instructions provided.   

## 2014-11-07 ENCOUNTER — Other Ambulatory Visit: Payer: Self-pay | Admitting: Internal Medicine

## 2014-11-07 ENCOUNTER — Encounter: Payer: Self-pay | Admitting: Pulmonary Disease

## 2014-11-07 ENCOUNTER — Ambulatory Visit (INDEPENDENT_AMBULATORY_CARE_PROVIDER_SITE_OTHER): Payer: BLUE CROSS/BLUE SHIELD | Admitting: Pulmonary Disease

## 2014-11-07 VITALS — BP 138/84 | HR 62 | Ht 66.0 in | Wt 273.0 lb

## 2014-11-07 DIAGNOSIS — G4733 Obstructive sleep apnea (adult) (pediatric): Secondary | ICD-10-CM

## 2014-11-07 DIAGNOSIS — Z87891 Personal history of nicotine dependence: Secondary | ICD-10-CM

## 2014-11-07 DIAGNOSIS — Z9989 Dependence on other enabling machines and devices: Principal | ICD-10-CM

## 2014-11-07 NOTE — Telephone Encounter (Signed)
Patient has complicated instructions to take a different dose and start holding coumadin and take lovenox in preparation for a surgery in a few weeks.  I am not sure what dose she is supposed to be on, so would prefer not to refill a this dose until seen post surgery.  Can you comment Dr. Maudie Mercury or Dr. Elie Confer?

## 2014-11-07 NOTE — Assessment & Plan Note (Signed)
CPAP supplies will be renewed x 1 year  Weight loss encouraged, compliance with goal of at least 4-6 hrs every night is the expectation. Advised against medications with sedative side effects Cautioned against driving when sleepy - understanding that sleepiness will vary on a day to day basis

## 2014-11-07 NOTE — Patient Instructions (Signed)
Trial of nicotrol inhaler #4 Make a QUIT attempt CPAP supplies will be renewed x 1 year Breathing test

## 2014-11-07 NOTE — Assessment & Plan Note (Signed)
Trial of nicotrol inhaler #4 Make a QUIT attempt Counseling for smoking cessation provided

## 2014-11-07 NOTE — Progress Notes (Signed)
   Subjective:    Patient ID: Alicia Martinez, female    DOB: 04/04/1956, 59 y.o.   MRN: [id number]  HPI   59 year old smoker referred for FU of obstructive sleep apnea.  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  She is maintained on Coumadin for recurrent DVT and PE.  PSG December 2013, and she weighed 250 pounds, showed severe obstructive sleep apnea with AHI of 88 events per hour, lowest desaturation of 84 percent. This was corrected by CPAP of 15 cm.  She started using CPAP since March 2014, initially started on nasal mask but converted to a full face. ([facility])   Download 07/30/13 on 15 cm >>no residuals, good usage, mild leak  Changed to auto 8-15 since she was feeling high pressure  Download on 8-15 09/2013 >> no residuals, avg 13 cm, no leak  11/07/2014  Chief Complaint  Patient presents with  . Follow-up    pt using cpap nightly about 8 hours nightly.  States sometimes she feels like she gets really hot wearing the cpap. [facility] told pt she needs new supplies.               She continues to smoke-a pack lasts several week -she has tried nicotine gum without relief She continues to struggle with her C Pap, but uses every night -he needs new supplies Spirometry did not show any evidence of airway obstruction            Review of Systems neg for any significant sore throat, dysphagia, itching, sneezing, nasal congestion or excess/ purulent secretions, fever, chills, sweats, unintended wt loss, pleuritic or exertional cp, hempoptysis, orthopnea pnd or change in chronic leg swelling. Also denies presyncope, palpitations, heartburn, abdominal pain, nausea, vomiting, diarrhea or change in bowel or urinary habits, dysuria,hematuria, rash, arthralgias, visual complaints, headache, numbness weakness or ataxia.     Objective:   Physical Exam  Gen. Pleasant, obese, in no distress ENT - no lesions, no post nasal drip Neck: No JVD, no  thyromegaly, no carotid bruits Lungs: no use of accessory muscles, no dullness to percussion, decreased without rales or rhonchi  Cardiovascular: Rhythm regular, heart sounds  normal, no murmurs or gallops, no peripheral edema Musculoskeletal: No deformities, no cyanosis or clubbing , no tremors       Assessment & Plan:

## 2014-11-08 NOTE — Telephone Encounter (Signed)
Contacted patient and she stated she accidentally submitted the refill request. She went to a different [facility], where they needed to transfer the active Rx refills from another store. Patient was able to repeat back dosing instructions in preparation for surgery next week (will start holding warfarin tomorrow and initiate enoxaparin).   Patient also has a [facility] appointment on Monday 11/11/14 for INR check and education.

## 2014-11-11 ENCOUNTER — Ambulatory Visit: Payer: BLUE CROSS/BLUE SHIELD

## 2014-11-12 ENCOUNTER — Telehealth: Payer: Self-pay | Admitting: Pharmacist

## 2014-11-12 ENCOUNTER — Ambulatory Visit (INDEPENDENT_AMBULATORY_CARE_PROVIDER_SITE_OTHER): Payer: BLUE CROSS/BLUE SHIELD | Admitting: Pharmacist

## 2014-11-12 DIAGNOSIS — Z7901 Long term (current) use of anticoagulants: Secondary | ICD-10-CM

## 2014-11-12 LAB — POCT INR: INR: 1.1

## 2014-11-12 NOTE — Progress Notes (Signed)
Anti-Coagulation Progress Note  Alicia Martinez is a 59 y.o. female who is currently on an anti-coagulation regimen.    RECENT RESULTS: Recent results are below, the most recent result is correlated with a dose of 15 mg. per week--with bridging. She will have been OFF warfarin for 5 days with LMWH bridge.  Lab Results  Component Value Date   INR 1.1 11/12/2014   INR 3.3 11/06/2014   INR 3.0 08/19/2014    ANTI-COAG DOSE: Anticoagulation Dose Instructions as of 11/12/2014      [name] Tue Wed Thu Fri Sat   New Dose 5 mg 5 mg 5 mg 5 mg 5 mg 5 mg 5 mg    Description        Patient instructed to hold x 1 dose today, then decrease to 1/5 tablet daily x 2 doses (Thurs 11/07/14 and Fri 11/08/14). Note:  Patient to have arthroscopic surgery on Wednesday November 13, 2014. Will hold warfarin for 5 days, starting on Saturday November 09, 2014 and through Wednesday November 13, 2014. Due to patient's previous history of multiple VTE events (PE x 1, DVT x 4) we elect to bridge the patient with Lovenox 120mg  (~1mg /kg) SQ q12h, starting Saturday February 13 at [place] SUMMARY: Anticoagulation Episode Summary    Current INR goal 2.0-3.0  Next INR check 11/11/2014  INR from last check 1.1! 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  Patient instructed to OMIT today's dose. Her procedure is tomorrow. She is instructed to resume warfarin as instructed by the orthopaedic surgeon performing the procedure--using the dosing recommendations provided by me.  Patient verbalized understanding of these instructions.       FOLLOW-UP Return in 6 days (on 11/18/2014) for Follow up INR at 1045h.  Jorene Guest, [name] Pharm.D., CACP

## 2014-11-12 NOTE — Telephone Encounter (Signed)
Called patient and spoke with her. I will take her quantum sufficient syringes of LMWH 120mg  for SQ q12h administration as "bridge" therapy to be commenced 24h AFTER her planned arthroscopic procedure. Will deliver to her home tonight at [address] [place].  She agrees with this plan. She has self-injected LMWH before. I discussed this case with Dr. Dareen Piano who is attending this afternoon and he concurs with the clinical decision to bridge after procedure given her history of repeated venous embolic events.

## 2014-11-12 NOTE — Telephone Encounter (Signed)
I concur with Dr. Gladstone Pih plan

## 2014-11-12 NOTE — Patient Instructions (Signed)
Patient instructed to take medications as defined in the Anti-coagulation Track section of this encounter.  Patient instructed to OMIT today's dose. Her procedure is tomorrow. She is instructed to resume warfarin as instructed by the orthopaedic surgeon performing the procedure--using the dosing recommendations provided by me.  Patient verbalized understanding of these instructions.

## 2014-11-18 ENCOUNTER — Ambulatory Visit (INDEPENDENT_AMBULATORY_CARE_PROVIDER_SITE_OTHER): Payer: BLUE CROSS/BLUE SHIELD | Admitting: Pharmacist

## 2014-11-18 DIAGNOSIS — Z7901 Long term (current) use of anticoagulants: Secondary | ICD-10-CM

## 2014-11-18 LAB — POCT INR: INR: 1.3

## 2014-11-18 NOTE — Patient Instructions (Signed)
Patient instructed to take medications as defined in the Anti-coagulation Track section of this encounter.  Patient instructed to TAKE today's dose. Continue Lovenox 120mg  subcutaneously administered injecting 2 inches away from your belly button at level of waistband. Do this every 12 hours.  Patient verbalized understanding of these instructions.

## 2014-11-18 NOTE — Progress Notes (Signed)
Anti-Coagulation Progress Note  Alicia Martinez is a 59 y.o. female who is currently on an anti-coagulation regimen.    RECENT RESULTS: Recent results are below, the most recent result is correlated with a dose of being OFF warfarin for 5 days before her arthroscopic exam last week. She was bridged with LMWH 120mg  SQ q12h until 24h prior to procedure. She recommenced LMWH 120mg  SQ q12H + concomitant warfarin beginning 24h after surgery. She will continue upon Lovenox 120mg  SQ q12h. Syringes have been provided.  Lab Results  Component Value Date   INR 1.30 11/18/2014   INR 1.1 11/12/2014   INR 3.3 11/06/2014    ANTI-COAG DOSE: Anticoagulation Dose Instructions as of 11/18/2014      [name] Tue Wed Thu Fri Sat   New Dose 5 mg 5 mg 5 mg 5 mg 5 mg 5 mg 5 mg    Description        She was off warfarin for 5 days prior to arthroscopy. She was on Lovenox 120mg  SQ q12h as bridge therapy while off warfarin. She resumed on Lovenox 120mg  SQ q12h + concomitant warfarin at 5mg  PO QD 1 day post-operatively. Additional syringes of Lovenox 120mg  have been provided today until such time she is seen by orthopaedist this Thursday.        ANTICOAG SUMMARY: Anticoagulation Episode Summary    Current INR goal 2.0-3.0  Next INR check 12/02/2014  INR from last check 1.30! 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  Patient instructed to TAKE today's dose. Continue Lovenox 120mg  subcutaneously administered injecting 2 inches away from your belly button at level of waistband. Do this every 12 hours.  Patient verbalized understanding of these instructions.       FOLLOW-UP Return in 2 weeks (on 12/02/2014) for Follow up INR at 1015h.  Jorene Guest, [name] Pharm.D., CACP

## 2014-11-20 ENCOUNTER — Other Ambulatory Visit: Payer: BLUE CROSS/BLUE SHIELD

## 2014-11-20 ENCOUNTER — Ambulatory Visit
Admission: RE | Admit: 2014-11-20 | Discharge: 2014-11-20 | Disposition: A | Discharge status: Home / Self Care | Payer: BLUE CROSS/BLUE SHIELD | Source: Ambulatory Visit | Attending: Internal Medicine | Admitting: Internal Medicine

## 2014-11-20 DIAGNOSIS — E049 Nontoxic goiter, unspecified: Secondary | ICD-10-CM

## 2014-12-02 ENCOUNTER — Telehealth: Payer: Self-pay | Admitting: Internal Medicine

## 2014-12-02 ENCOUNTER — Ambulatory Visit (INDEPENDENT_AMBULATORY_CARE_PROVIDER_SITE_OTHER): Payer: BLUE CROSS/BLUE SHIELD | Admitting: Pharmacist

## 2014-12-02 DIAGNOSIS — Z7901 Long term (current) use of anticoagulants: Secondary | ICD-10-CM

## 2014-12-02 DIAGNOSIS — I82509 Chronic embolism and thrombosis of unspecified deep veins of unspecified lower extremity: Secondary | ICD-10-CM

## 2014-12-02 LAB — POCT INR: INR: 2.4

## 2014-12-02 MED ORDER — RIVAROXABAN 20 MG PO TABS: 20.0000 mg | ORAL_TABLET | Freq: Every day | ORAL | Status: DC

## 2014-12-02 NOTE — Patient Instructions (Signed)
Patient instructed to take medications as defined in the Anti-coagulation Track section of this encounter.  Patient instructed to OMIT/DO NOT TAKE today's dose of warfarin.  Patient has agreed with advice and consent to commence upon rivaroxaban/Xarelto 20mg  to be taken by mouth ONCE DAILY with evening meal.  Patient verbalized understanding of these instructions.

## 2014-12-02 NOTE — Telephone Encounter (Signed)
New problem   Pt want to know results of her thyroid test Dr Harrington Challenger order. Please call pt.

## 2014-12-02 NOTE — Telephone Encounter (Signed)
Patient notified of results from Thyroid test. Forwarding to PCP/Dr. Gordy Levan. Patient verbalized agreement with current treatment plan.

## 2014-12-02 NOTE — Progress Notes (Signed)
Anti-Coagulation Progress Note  Alicia Martinez is a 59 y.o. female who is currently on an anti-coagulation regimen.    RECENT RESULTS: Recent results are below, the most recent result is correlated with a dose of 35 mg. per week:  A discussion of full disclosure and fair balance regarding an alterative to warfarin therapy was held with patient. Patient has had multiple venous embolic events with poor time in target range (TTR). Her HAS Bled score is calculated as 2 indicating low risk for major bleeding. Her SAMe-TT2R2 score indicates with a score > 2 that she may be more difficult to achieve TTR with warfarin--which her medical record seems to bear out. We discussed her options for alternative anticoagulation with the role of a direct oral thrombin inhibitor (rivaroxaban/Xarelto) 20mg  PO ONCE DAILY with evening meal. Her pharmacy was contacted to determine her co-pay ($30) which she was accepting of--citing the following:  She pays $25 co-pay for her prothrombin/INR determinations---as often as they must be performed relative to her low TTR. She pays $7 for her warfarin co-pay. She recognized that relative to out of pocket costs that the [facility] will provide a net savings per month. We discussed issues of signs and symptom(s) recognition that she would call clinic for (blood in:  Urine, stool, nose-bleeds, throwing up blood, coughing up blood, increased bruising; as well as signs and symptom(s) recognition of recurrence of VTE (warmth, redness, pain, swelling of lower extremity as well as shortness of breath, chest pain, coughing up blood or fever, increased heart rate or respiratory rate that could suggest PE). It was called to her attention the following:  These signs and symptoms are the SAME signs and symptoms that she would come to clinic or go to the ED if they were to occur while on warfarin--but between visits for INR determination. She understands that there is no monitoring of INRs for patients on  direct oral anticoagulants (rivaroxaban/Xarelto). She understands that the rivaroxaban/Xarelto MUST BE TAKEN WITH PM Dinner meal (largest meal of day) for maximum absorption of drug. She understands that to see the same demonstration of efficacy and safety as was shown in the clinical trials and is referenced in the package insert--that she must be compliant to taking the medication daily. She understands that there is no current pharmacologic direct antidote for reversal of rivaroxaban/Xarelto but in the event of life-threatening bleeding that she would treated as per the healthcare system's adopted management strategy which would include use of a 2F [place] non-activated as well as all other supportive measures. It was described to her that major bleeding was seen at a lessened rate for rivaroxaban/Xarelto vs. The traditional comparator of warfarin/Coumadin. She verbalized understanding of these issues. She was instructed that as per European Guidelines--a periodic SCc would be drawn to permit calculation of her creatinine clearance would be performed MINIMALLY every 6 months--as as often perhaps as every 3-4 months. We will contact her on follow up to assure continued compliance to her dosing instructions, etc.   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  Patient instructed to OMIT/DO NOT TAKE today's dose of warfarin.  Patient has agreed with advice and consent to commence upon rivaroxaban/Xarelto 20mg  to be taken by mouth ONCE DAILY with evening meal.  Patient verbalized understanding of these instructions.       FOLLOW-UP Return if symptoms worsen or fail to improve.  Jorene Guest, [name] Pharm.D., CACP

## 2014-12-16 ENCOUNTER — Encounter: Payer: Self-pay | Admitting: Pharmacist

## 2014-12-17 ENCOUNTER — Other Ambulatory Visit: Payer: Self-pay | Admitting: Internal Medicine

## 2014-12-24 ENCOUNTER — Telehealth: Payer: Self-pay | Admitting: Pharmacist

## 2014-12-24 NOTE — Telephone Encounter (Addendum)
CLINICAL PHARMACY ANTICOAGULATION MANAGEMENT Alicia Martinez is a 59 y.o. female who was contacted for follow up regarding  rivaroxaban (Xarelto) treatment (transitioned from warfarin 12/02/14).    ASSESSMENT Indication(s): History PE and DVT Duration: indefinite  Labs:    Component Value Date/Time   AST 16 05/08/2013 1055   ALT 20 05/08/2013 1055   NA 139 11/04/2014 1653   K 3.4* 11/04/2014 1653   CL 102 11/04/2014 1653   CO2 31 11/04/2014 1653   GLUCOSE 105* 11/04/2014 1653   BUN 12 11/04/2014 1653   CREATININE 0.75 11/04/2014 1653   CREATININE 0.63 05/08/2013 1055   CALCIUM 9.3 11/04/2014 1653   GFRAA >90 11/04/2014 1653   GFRAA >89 08/07/2012 1656   WBC 5.0 11/04/2014 1653   HGB 13.2 11/04/2014 1653   HCT 40.0 11/04/2014 1653   PLT 294 11/04/2014 1653   Anticoagulant Dose: xarelto 20 mg daily  Adherence: Patient has no known adherence challenges.  No side effects associated with anticoagulant.   Safety: Patient reports no recent signs or symptoms of bleeding. Patient reports no signs of symptoms of thrombosis.    Recommendations:  Medication: no changes recommended at this time  Monitoring: CMP and CBC at next PCP visit (transitioned to [place] on 12/02/14). After transition from warfarin, European Society of Cardiology [facility]) Guidelines suggest initial 1 month follow up to evaluate adherence, thromboembolic or bleeding events, other side effects, co-medications, and potential need for blood sampling (hemoglobin, renal/hepatic function). Thereafter, consideration of these same factors should be made 3 months, then 6 months after transition if possible according to ESC (no American guidelines currently available).  PLAN Patient Instructions: There are no Patient Instructions on file for this visit.  Follow-up No Follow-up on file.  Kim,Jennifer J 12/24/2014, 4:26 PM

## 2015-01-30 ENCOUNTER — Encounter: Payer: Self-pay | Admitting: Pharmacist

## 2015-01-30 NOTE — Progress Notes (Signed)
Patient ID: Alicia Martinez, female   DOB: [birth year], 60 y.o.   MRN: [id number]  Patient was a suboptimal warfarin candidate. Collaboration with Engineer, site for successful procurement of Xarelto, copay $30 per month.

## 2015-04-03 ENCOUNTER — Other Ambulatory Visit: Payer: Self-pay | Admitting: Pharmacist

## 2015-04-03 DIAGNOSIS — Z86718 Personal history of other venous thrombosis and embolism: Secondary | ICD-10-CM

## 2015-04-03 NOTE — Telephone Encounter (Addendum)
Alicia Martinez is a 59 y.o. female who was contacted for follow-up monitoring of  rivaroxaban (Xarelto) therapy.  Unable to reach patient.  ASSESSMENT Indication(s): history of recurrent VTE Duration: indefinite Date switched from warfarin: 12/02/2014  Labs:    Component Value Date/Time   AST 16 05/08/2013 1055   ALT 20 05/08/2013 1055   NA 139 11/04/2014 1653   K 3.4* 11/04/2014 1653   CL 102 11/04/2014 1653   CO2 31 11/04/2014 1653   BUN 12 11/04/2014 1653   CREATININE 0.75 11/04/2014 1653   CREATININE 0.63 05/08/2013 1055   CALCIUM 9.3 11/04/2014 1653   GFRAA >90 11/04/2014 1653   GFRAA >89 08/07/2012 1656   WBC 5.0 11/04/2014 1653   HGB 13.2 11/04/2014 1653   HCT 40.0 11/04/2014 1653   PLT 294 11/04/2014 1653   rivaroxaban (Xarelto) Dose: 20 mg daily  Adherence: Patient has no known adherence challenges. Per pharmacy, last filled on 03/12/15 and history of consistent monthly fills ([id number] - [place], [place] - [address] AT [place]).  RECOMMENDATIONS  Medication: no changes recommended at this time  Monitoring: CMP and CBC. For ongoing monitoring, European Society of Cardiology [facility]) Guidelines (no American guidelines currently available) suggest evaluating adherence, thromboembolic or bleeding events, other side effects, medication changes at each office visit. Blood sampling (hemoglobin, renal/hepatic function) should be considered yearly, but for elderly or frail patients should be considered every 6 months. Renal patients or hepatic patients may need more frequent monitoring.  PLAN Signing off of case at this time due to successful transition from warfarin to [place] and will continue to be available for assistance with patient care in the future as necessary.  [place] Pharmacist 04/03/2015, 3:11 PM

## 2015-04-08 ENCOUNTER — Other Ambulatory Visit: Payer: Self-pay | Admitting: Internal Medicine

## 2015-04-08 MED ORDER — RIVAROXABAN 20 MG PO TABS: 20.0000 mg | ORAL_TABLET | Freq: Every day | ORAL | Status: DC

## 2015-04-08 NOTE — Telephone Encounter (Signed)
Sure

## 2015-04-10 ENCOUNTER — Other Ambulatory Visit: Payer: Self-pay | Admitting: Internal Medicine

## 2015-07-30 ENCOUNTER — Other Ambulatory Visit: Payer: Self-pay

## 2015-07-30 DIAGNOSIS — Z1231 Encounter for screening mammogram for malignant neoplasm of breast: Secondary | ICD-10-CM

## 2015-08-08 ENCOUNTER — Other Ambulatory Visit: Payer: Self-pay | Admitting: Internal Medicine

## 2015-08-14 ENCOUNTER — Ambulatory Visit: Payer: BLUE CROSS/BLUE SHIELD

## 2015-09-03 ENCOUNTER — Ambulatory Visit: Payer: BLUE CROSS/BLUE SHIELD

## 2015-09-25 ENCOUNTER — Other Ambulatory Visit: Payer: Self-pay | Admitting: Internal Medicine

## 2015-09-26 ENCOUNTER — Inpatient Hospital Stay: Admission: RE | Admit: 2015-09-26 | Payer: BLUE CROSS/BLUE SHIELD | Source: Ambulatory Visit

## 2015-10-09 ENCOUNTER — Ambulatory Visit
Admission: RE | Admit: 2015-10-09 | Discharge: 2015-10-09 | Disposition: A | Discharge status: Home / Self Care | Payer: BLUE CROSS/BLUE SHIELD | Source: Ambulatory Visit

## 2015-10-09 DIAGNOSIS — Z1231 Encounter for screening mammogram for malignant neoplasm of breast: Secondary | ICD-10-CM

## 2015-10-30 ENCOUNTER — Encounter: Payer: Self-pay | Admitting: Internal Medicine

## 2015-10-30 DIAGNOSIS — K5790 Diverticulosis of intestine, part unspecified, without perforation or abscess without bleeding: Secondary | ICD-10-CM | POA: Insufficient documentation

## 2015-10-31 ENCOUNTER — Telehealth: Payer: Self-pay | Admitting: Internal Medicine

## 2015-10-31 NOTE — Telephone Encounter (Signed)
APPT REMINDER CALL/LMTCB IF SHE NEEDS TO CANCEL °

## 2015-11-03 ENCOUNTER — Ambulatory Visit (INDEPENDENT_AMBULATORY_CARE_PROVIDER_SITE_OTHER): Payer: BLUE CROSS/BLUE SHIELD | Admitting: Internal Medicine

## 2015-11-03 ENCOUNTER — Encounter: Payer: Self-pay | Admitting: Internal Medicine

## 2015-11-03 VITALS — BP 145/77 | HR 85 | Temp 98.5°F | Ht 66.0 in | Wt 264.1 lb

## 2015-11-03 DIAGNOSIS — E042 Nontoxic multinodular goiter: Secondary | ICD-10-CM | POA: Insufficient documentation

## 2015-11-03 DIAGNOSIS — M25551 Pain in right hip: Secondary | ICD-10-CM

## 2015-11-03 DIAGNOSIS — R319 Hematuria, unspecified: Secondary | ICD-10-CM | POA: Diagnosis not present

## 2015-11-03 DIAGNOSIS — F17210 Nicotine dependence, cigarettes, uncomplicated: Secondary | ICD-10-CM

## 2015-11-03 DIAGNOSIS — Z9989 Dependence on other enabling machines and devices: Secondary | ICD-10-CM | POA: Diagnosis not present

## 2015-11-03 DIAGNOSIS — Z87891 Personal history of nicotine dependence: Secondary | ICD-10-CM

## 2015-11-03 DIAGNOSIS — G4733 Obstructive sleep apnea (adult) (pediatric): Secondary | ICD-10-CM | POA: Diagnosis not present

## 2015-11-03 DIAGNOSIS — D126 Benign neoplasm of colon, unspecified: Secondary | ICD-10-CM

## 2015-11-03 DIAGNOSIS — I10 Essential (primary) hypertension: Secondary | ICD-10-CM

## 2015-11-03 DIAGNOSIS — Z6841 Body Mass Index (BMI) 40.0 and over, adult: Secondary | ICD-10-CM

## 2015-11-03 DIAGNOSIS — Z86010 Personal history of colonic polyps: Secondary | ICD-10-CM

## 2015-11-03 DIAGNOSIS — M170 Bilateral primary osteoarthritis of knee: Secondary | ICD-10-CM

## 2015-11-03 DIAGNOSIS — M25552 Pain in left hip: Secondary | ICD-10-CM | POA: Insufficient documentation

## 2015-11-03 NOTE — Progress Notes (Signed)
Patient ID: [name], female   DOB: May 04, 1956, 60 y.o.   MRN: [id number]     Subjective:   Patient ID: GERICA ZELLAR female    DOB: 12-Jun-1956 60 y.o.    MRN: [id number] Health Maintenance Due: Health Maintenance Due  Topic Date Due  . Hepatitis C Screening  03-Jun-1956  . HIV Screening  07/05/1973  . TETANUS/TDAP  07/05/1977  . INFLUENZA VACCINE  04/28/2015    _________________________________________________  HPI: Ms.Zarinah A Bynum is a 60 y.o. female here for a routine visit.  Pt has a PMH outlined below.  Please see problem-based charting assessment and plan for further status of patient's chronic medical problems addressed at today's visit.  PMH: Past Medical History  Diagnosis Date  . Deep vein thrombosis (HCC)     left lower extremity  . Pulmonary embolism (HCC)     LAST ONE WAS 1995- ON CHRONIC COUMADIN  . Allergy   . Anemia   . Hypertension   . History of rectal cancer     T1N0 resected 08/2009  . Diverticulitis large intestine   . Obesity   . Infectious colitis   . Sleep apnea 08/09/2012    Sleep study 02/2013 : Severe obstructive sleep apnea/hypopnea syndrome, AHI 88.7, desat to 84%. CPAP titration to 15 CWP, AHI 0 per hour. Medium ResMed Mirage Quattro full-face mask with heated humidifier.      . Pain     LOWER BACK AND RT KNEE AND BOTH SHOULDERS - LEFT SHOULDER PAIN WORSE--PT TOLD ARTHRITIS  . Arthritis     DJD, ARTHRITIS  . Heart palpitations     HX OF PALPITATIONS AND SOME DIZZINESS - EVALUATED BY CARDIOLOGIST DR. ROSS - OFFICE NOTES IS EPIC 08/24/13 - "EVENT MONITOR SHOWED NO SIGNIFICANT ARRHYTHMIA"  . Carotid artery plaque 08-30-13    PER CAROTID DOPPLER STUDY    Medications: Current Outpatient Prescriptions on File Prior to Visit  Medication Sig Dispense Refill  . aspirin 81 MG tablet Take 81 mg by mouth daily.    . capsicum (MUSCLE RELIEF) 0.075 % topical cream Apply 1 application topically 2 (two) times daily as needed  (muscle pain).    . hydrochlorothiazide (HYDRODIURIL) 25 MG tablet TAKE 1/2 TABLET BY MOUTH DAILY FOR BLOOD PRESSURE 30 tablet 3  . Psyllium (METAMUCIL PO) Take 1 packet by mouth daily.     Marland Kitchen tetrahydrozoline 0.05 % ophthalmic solution Place 1 drop into both eyes as needed (allergies/red eyes). VISINE    . XARELTO 20 MG TABS tablet TAKE 1 TABLET(20 MG) BY MOUTH DAILY WITH DINNER 30 tablet 3   No current facility-administered medications on file prior to visit.    Allergies: Allergies  Allergen Reactions  . Other     SOME ADHESIVES CAUSE SKIN IRRITATION  - STATES PAPER TAPE OK.  STATES SOME EKG ELECTRODES AND SOME SKIN PATCHES ALSO CAUSE IRRITATION    FH: Family History  Problem Relation Age of Onset  . Cancer Mother     unknown type but patient thinks it was bone marrow cancer  . Heart disease Sister   . Heart disease Brother   . Clotting disorder Brother   . Colon cancer Neg Hx   . Stomach cancer Neg Hx     SH: Social History   Social History  . Marital Status: Single    Spouse Name: N/A  . Number of Children: N/A  . Years of Education: N/A   Occupational History  .  [employer], structural  Social History Main Topics  . Smoking status: Current Some Day Smoker -- 0.40 packs/day for 40 years    Types: Cigarettes  . Smokeless tobacco: Never Used     Comment: ABOUT 5 CIGARETTES A DAY  . Alcohol Use: No  . Drug Use: No  . Sexual Activity: Not Asked   Other Topics Concern  . None   Social History Narrative   Patient does not regular exercise. He has three boys. Daily caffeine use 2/day.    Review of Systems: Constitutional: Negative for fever, chills and weight loss.  Eyes: Negative for blurred vision.  Respiratory: Negative for cough and shortness of breath.  Cardiovascular: Negative for chest pain, palpitations and leg swelling.  Gastrointestinal: Negative for nausea, vomiting, abdominal pain, diarrhea, constipation and blood in stool.  Genitourinary: Negative for  dysuria, urgency and frequency.  Musculoskeletal: Negative for myalgias and +back pain and R hip pain.  Neurological: Negative for dizziness, weakness and headaches.     Objective:   Vital Signs: Filed Vitals:   11/03/15 1616  BP: 145/77  Pulse: 85  Temp: 98.5 F (36.9 C)  TempSrc: Oral  Height: 5\' 6"  (1.676 m)  Weight: 264 lb 1.6 oz (119.795 kg)  SpO2: 100%      BP Readings from Last 3 Encounters:  11/03/15 145/77  11/07/14 138/84  11/04/14 137/78    Physical Exam: Constitutional: Vital signs reviewed.  Patient is in NAD and cooperative with exam.  Head: Normocephalic and atraumatic. Eyes: EOMI, conjunctivae nl, no scleral icterus.  Neck: Supple. Cardiovascular: RRR, no MRG. Pulmonary/Chest: normal effort, CTAB, no wheezes, rales, or rhonchi. Abdominal: Soft. NT/ND +BS. Neurological: A&O x3, cranial nerves II-XII are grossly intact, moving all extremities. MSK: R hip FROM without deformity, erythema, or warmth.  No swelling.  Tender to palpation in the lateral surface.  Gait normal.  Extremities: 2+DP b/l; no pitting edema. Skin: Warm, dry and intact. No rash.   Assessment & Plan:   Assessment and plan was discussed and formulated with my attending.

## 2015-11-03 NOTE — Assessment & Plan Note (Addendum)
A: HTN Pt currently on HCTZ 25mg  (1/2 tab daily).  BP today 145/77 P: -Cont current medication  -Check CMP

## 2015-11-03 NOTE — Patient Instructions (Signed)
Thank you for your visit today.   Please return to the internal medicine clinic in 6 month(s) or sooner if needed.     I have made the following additions/changes to your medications: Continue current medications.   Please be sure to bring all of your medications with you to every visit; this includes herbal supplements, vitamins, eye drops, and any over-the-counter medications.   Should you have any questions regarding your medications and/or any new or worsening symptoms, please be sure to call the clinic at [phone].   If you believe that you are suffering from a life threatening condition or one that may result in the loss of limb or function, then you should call 911 and proceed to the nearest Emergency Department.   A healthy lifestyle and preventative care can promote health and wellness.   Maintain regular health, dental, and eye exams.  Eat a healthy diet. Foods like vegetables, fruits, whole grains, low-fat dairy products, and lean protein foods contain the nutrients you need without too many calories. Decrease your intake of foods high in solid fats, added sugars, and salt. Get information about a proper diet from your caregiver, if necessary.  Regular physical exercise is one of the most important things you can do for your health. Most adults should get at least 150 minutes of moderate-intensity exercise (any activity that increases your heart rate and causes you to sweat) each week. In addition, most adults need muscle-strengthening exercises on 2 or more days a week.   Maintain a healthy weight. The body mass index (BMI) is a screening tool to identify possible weight problems. It provides an estimate of body fat based on height and weight. Your caregiver can help determine your BMI, and can help you achieve or maintain a healthy weight. For adults 20 years and older:  A BMI below 18.5 is considered underweight.  A BMI of 18.5 to 24.9 is normal.  A BMI of 25 to 29.9  is considered overweight.  A BMI of 30 and above is considered obese.

## 2015-11-03 NOTE — Assessment & Plan Note (Addendum)
A: History of persistent hematuria on xarelto.  She is a smoker.   P: -Repeat UA/cbc -Referral to urology for persistent hematuria

## 2015-11-05 NOTE — Assessment & Plan Note (Signed)
A: OA-She received some sort of procedure for her left knee stating "they went in and cleaned it out."  Reports much relief since procedure.  P: -Monitor

## 2015-11-05 NOTE — Assessment & Plan Note (Signed)
A: Thyroid nodule P: Will repeat thyroid US

## 2015-11-05 NOTE — Assessment & Plan Note (Signed)
A: Right hip pain going on since Thanksgiving.  Pain is worse at night and is unable to lie on it.  No fevers/chills.  R hip tender to palpation on the lateral surface.  Normal ROM.  Likely trochanteric bursitis vs. OA but favor former given lateral location of pain. P: -XR of right hip

## 2015-11-05 NOTE — Assessment & Plan Note (Signed)
P: -F/u colonoscopy in 2018

## 2015-11-05 NOTE — Assessment & Plan Note (Signed)
A: OSA-Reports compliance with CPAP. P: -Has f/u appt with Dr. Elsworth Soho  -Cont CPAP

## 2015-11-05 NOTE — Assessment & Plan Note (Signed)
P: Discussed smoking cessation, but still continues to smoke ~5cig/day

## 2015-11-05 NOTE — Assessment & Plan Note (Signed)
P: -Discussed that weight loss would likely help her back and hip pain

## 2015-11-06 NOTE — Progress Notes (Signed)
Internal Medicine Clinic Attending  Case discussed with Dr. Gill soon after the resident saw the patient.  We reviewed the resident's history and exam and pertinent patient test results.  I agree with the assessment, diagnosis, and plan of care documented in the resident's note.  

## 2015-11-17 ENCOUNTER — Ambulatory Visit (HOSPITAL_COMMUNITY)
Admission: RE | Admit: 2015-11-17 | Discharge: 2015-11-17 | Disposition: A | Discharge status: Home / Self Care | Payer: BLUE CROSS/BLUE SHIELD | Source: Ambulatory Visit | Attending: Internal Medicine | Admitting: Internal Medicine

## 2015-11-17 ENCOUNTER — Other Ambulatory Visit (INDEPENDENT_AMBULATORY_CARE_PROVIDER_SITE_OTHER): Payer: BLUE CROSS/BLUE SHIELD

## 2015-11-17 DIAGNOSIS — I10 Essential (primary) hypertension: Secondary | ICD-10-CM

## 2015-11-17 DIAGNOSIS — R319 Hematuria, unspecified: Secondary | ICD-10-CM | POA: Diagnosis not present

## 2015-11-17 DIAGNOSIS — E042 Nontoxic multinodular goiter: Secondary | ICD-10-CM | POA: Diagnosis not present

## 2015-11-18 LAB — CBC WITH DIFFERENTIAL/PLATELET
BASOS ABS: 0 10*3/uL (ref 0.0–0.2)
BASOS: 0 %
EOS (ABSOLUTE): 0.1 10*3/uL (ref 0.0–0.4)
EOS: 1 %
HEMATOCRIT: 36.6 % (ref 34.0–46.6)
Hemoglobin: 12.2 g/dL (ref 11.1–15.9)
Immature Grans (Abs): 0 10*3/uL (ref 0.0–0.1)
Immature Granulocytes: 0 %
Lymphocytes Absolute: 1.9 10*3/uL (ref 0.7–3.1)
Lymphs: 40 %
MCH: 29.2 pg (ref 26.6–33.0)
MCHC: 33.3 g/dL (ref 31.5–35.7)
MCV: 88 fL (ref 79–97)
MONOS ABS: 0.3 10*3/uL (ref 0.1–0.9)
Monocytes: 5 %
NEUTROS PCT: 54 %
Neutrophils Absolute: 2.5 10*3/uL (ref 1.4–7.0)
Platelets: 304 10*3/uL (ref 150–379)
RBC: 4.18 x10E6/uL (ref 3.77–5.28)
RDW: 14.2 % (ref 12.3–15.4)
WBC: 4.8 10*3/uL (ref 3.4–10.8)

## 2015-11-18 LAB — URINALYSIS, ROUTINE W REFLEX MICROSCOPIC
Bilirubin, UA: NEGATIVE
GLUCOSE, UA: NEGATIVE
Ketones, UA: NEGATIVE
Leukocytes, UA: NEGATIVE
Nitrite, UA: NEGATIVE
Protein, UA: NEGATIVE
RBC, UA: NEGATIVE
Specific Gravity, UA: 1.02 (ref 1.005–1.030)
Urobilinogen, Ur: 0.2 mg/dL (ref 0.2–1.0)
pH, UA: 6 (ref 5.0–7.5)

## 2015-11-18 LAB — CMP14 + ANION GAP
ALBUMIN: 3.9 g/dL (ref 3.5–5.5)
ALT: 18 IU/L (ref 0–32)
AST: 13 IU/L (ref 0–40)
Albumin/Globulin Ratio: 1.3 (ref 1.1–2.5)
Alkaline Phosphatase: 101 IU/L (ref 39–117)
Anion Gap: 19 mmol/L — ABNORMAL HIGH (ref 10.0–18.0)
BILIRUBIN TOTAL: 0.4 mg/dL (ref 0.0–1.2)
BUN / CREAT RATIO: 21 (ref 9–23)
BUN: 12 mg/dL (ref 6–24)
CHLORIDE: 100 mmol/L (ref 96–106)
CO2: 24 mmol/L (ref 18–29)
Calcium: 9.4 mg/dL (ref 8.7–10.2)
Creatinine, Ser: 0.58 mg/dL (ref 0.57–1.00)
GFR calc non Af Amer: 103 mL/min/{1.73_m2} (ref >59)
GFR, EST AFRICAN AMERICAN: 118 mL/min/{1.73_m2} (ref >59)
GLUCOSE: 98 mg/dL (ref 65–99)
Globulin, Total: 3.1 g/dL (ref 1.5–4.5)
POTASSIUM: 4.5 mmol/L (ref 3.5–5.2)
Sodium: 143 mmol/L (ref 134–144)
TOTAL PROTEIN: 7 g/dL (ref 6.0–8.5)

## 2015-12-08 ENCOUNTER — Encounter (INDEPENDENT_AMBULATORY_CARE_PROVIDER_SITE_OTHER): Payer: BLUE CROSS/BLUE SHIELD | Admitting: Internal Medicine

## 2015-12-15 ENCOUNTER — Encounter: Payer: Self-pay | Admitting: Pulmonary Disease

## 2015-12-15 ENCOUNTER — Encounter (INDEPENDENT_AMBULATORY_CARE_PROVIDER_SITE_OTHER): Payer: Self-pay

## 2015-12-15 ENCOUNTER — Encounter: Payer: BLUE CROSS/BLUE SHIELD | Admitting: Internal Medicine

## 2015-12-15 ENCOUNTER — Ambulatory Visit (INDEPENDENT_AMBULATORY_CARE_PROVIDER_SITE_OTHER): Payer: BLUE CROSS/BLUE SHIELD | Admitting: Pulmonary Disease

## 2015-12-15 DIAGNOSIS — G4733 Obstructive sleep apnea (adult) (pediatric): Secondary | ICD-10-CM

## 2015-12-15 DIAGNOSIS — Z9989 Dependence on other enabling machines and devices: Principal | ICD-10-CM

## 2015-12-15 MED ORDER — VARENICLINE TARTRATE 1 MG PO TABS: 1.0000 mg | ORAL_TABLET | Freq: Two times a day (BID) | ORAL | Status: DC

## 2015-12-15 MED ORDER — VARENICLINE TARTRATE 0.5 MG X 11 & 1 MG X 42 PO MISC
ORAL | Status: DC
Start: 2015-12-15 — End: 2017-04-12

## 2015-12-15 NOTE — Progress Notes (Signed)
   Subjective:    Patient ID: Alicia Martinez, female    DOB: 05-17-1956, 59 y.o.   MRN: [id number]  HPI  60 year old smoker  for FU of obstructive sleep apnea.  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  She is maintained on Coumadin for recurrent DVT and PE.  Marland Kitchen  She started using CPAP since March 2014, initially started on nasal mask but converted to a full face. ([facility])      12/15/2015  Chief Complaint  Patient presents with  . Follow-up    Patient states that took her machine to [facility] in January to get download and they were supposed to send [place] a download, did not receive download.  She said that she asked for new supplies, they were supposed to send the supplies, but she never received them.  full face mask is hard to sleep with, but she still wears CPAP approx 7 hours nightly.    She continues to smoke about 5/d- -she has tried nicotine gum without relief She continues to struggle with her C Pap - needs new supplies, reports compliant Pr ok, prefers nasal mask, no dryness Wt unchanged  Download 07/30/13 on 15 cm >>no residuals, good usage, mild leak  Changed to auto 8-15 since she was feeling high pressure  Download on 8-15 09/2013 >> no residuals, avg 13 cm, no leak  Significant tests/ events   PSG 12/ 2013 -wt 250 pounds, showed severe obstructive sleep apnea with AHI of 88 events per hour, lowest desaturation of 84 percent. This was corrected by CPAP of 15 cm  Spirometry 10/2014 >>> no  evidence of airway obstruction  Review of Systems Patient denies significant dyspnea,cough, hemoptysis,  chest pain, palpitations, pedal edema, orthopnea, paroxysmal nocturnal dyspnea, lightheadedness, nausea, vomiting, abdominal or  leg pains      Objective:   Physical Exam  Gen. Pleasant, obese, in no distress ENT - no lesions, no post nasal drip, class 2 airway Neck: No JVD, no thyromegaly, no carotid bruits Lungs: no use of  accessory muscles, no dullness to percussion, decreased without rales or rhonchi  Cardiovascular: Rhythm regular, heart sounds  normal, no murmurs or gallops, no peripheral edema Musculoskeletal: No deformities, no cyanosis or clubbing , no tremors       Assessment & Plan:

## 2015-12-15 NOTE — Assessment & Plan Note (Signed)
Rx for nasal mask & supplies to [facility] Check download to ensure you are on auto settings   Weight loss encouraged, compliance with goal of at least 4-6 hrs every night is the expectation. Advised against medications with sedative side effects Cautioned against driving when sleepy - understanding that sleepiness will vary on a day to day basis

## 2015-12-15 NOTE — Patient Instructions (Signed)
Rx for nasal mask & supplies to [facility] Check download to ensure you are on auto settings Rx for chantix - starter pack & continuation pack x 1 - we discussed side effects

## 2015-12-15 NOTE — Assessment & Plan Note (Signed)
Rx for chantix - starter pack & continuation pack x 1 - we discussed side effects Discussed cessation & quit date

## 2015-12-29 ENCOUNTER — Other Ambulatory Visit: Payer: Self-pay | Admitting: Internal Medicine

## 2015-12-30 ENCOUNTER — Encounter: Payer: Self-pay | Admitting: Internal Medicine

## 2015-12-30 ENCOUNTER — Ambulatory Visit (INDEPENDENT_AMBULATORY_CARE_PROVIDER_SITE_OTHER): Payer: BLUE CROSS/BLUE SHIELD | Admitting: Internal Medicine

## 2015-12-30 VITALS — BP 122/66 | HR 91 | Temp 98.3°F | Ht 66.0 in | Wt 263.0 lb

## 2015-12-30 DIAGNOSIS — B351 Tinea unguium: Secondary | ICD-10-CM | POA: Insufficient documentation

## 2015-12-30 DIAGNOSIS — Z87448 Personal history of other diseases of urinary system: Secondary | ICD-10-CM | POA: Diagnosis not present

## 2015-12-30 DIAGNOSIS — E042 Nontoxic multinodular goiter: Secondary | ICD-10-CM

## 2015-12-30 DIAGNOSIS — R319 Hematuria, unspecified: Secondary | ICD-10-CM

## 2015-12-30 DIAGNOSIS — E041 Nontoxic single thyroid nodule: Secondary | ICD-10-CM

## 2015-12-30 MED ORDER — TERBINAFINE HCL 250 MG PO TABS: 250.0000 mg | ORAL_TABLET | Freq: Every day | ORAL | Status: DC

## 2015-12-30 NOTE — Assessment & Plan Note (Addendum)
Pt with h/o thyroid nodules found incidentally on a carotid US.  Repeat US 2017 showed no concerning features for malignancy and nodules unchanged.  No compressive symptoms, dysphagia, weight changes.  Previous TSH was normal.  Would not qualify for biopsy currently. -recheck TSH -would continue to monitor

## 2015-12-30 NOTE — Assessment & Plan Note (Addendum)
Resolved. -referral to urology previously given

## 2015-12-30 NOTE — Assessment & Plan Note (Signed)
-  referral to podiatry -lamisil 250mg  daily x 6 wks

## 2015-12-30 NOTE — Progress Notes (Signed)
Patient ID: Alicia Martinez, female   DOB: 25-Aug-1956, 60 y.o.   MRN: [id number]     Subjective:   Patient ID: Alicia Martinez female    DOB: [birth year] 60 y.o.    MRN: [id number] Health Maintenance Due: Health Maintenance Due  Topic Date Due  . Hepatitis C Screening  11/02/1957  . HIV Screening  07/05/1973  . TETANUS/TDAP  07/05/1977    _________________________________________________  HPI: [name] is a 60 y.o. female here for a routine visit for f/u of thyroid nodules.  Pt has a PMH outlined below.  Please see problem-based charting assessment and plan for further status of patient's chronic medical problems addressed at today's visit.  PMH: Past Medical History  Diagnosis Date  . Deep vein thrombosis (HCC)     left lower extremity  . Pulmonary embolism (HCC)     LAST ONE WAS 1995- ON CHRONIC COUMADIN  . Allergy   . Anemia   . Hypertension   . History of rectal cancer     T1N0 resected 08/2009  . Diverticulitis large intestine   . Obesity   . Infectious colitis   . Sleep apnea 08/09/2012    Sleep study 02/2013 : Severe obstructive sleep apnea/hypopnea syndrome, AHI 88.7, desat to 84%. CPAP titration to 15 CWP, AHI 0 per hour. Medium ResMed Mirage Quattro full-face mask with heated humidifier.      . Pain     LOWER BACK AND RT KNEE AND BOTH SHOULDERS - LEFT SHOULDER PAIN WORSE--PT TOLD ARTHRITIS  . Arthritis     DJD, ARTHRITIS  . Heart palpitations     HX OF PALPITATIONS AND SOME DIZZINESS - EVALUATED BY CARDIOLOGIST DR. ROSS - OFFICE NOTES IS EPIC 08/24/13 - "EVENT MONITOR SHOWED NO SIGNIFICANT ARRHYTHMIA"  . Carotid artery plaque 08-30-13    PER CAROTID DOPPLER STUDY    Medications: Current Outpatient Prescriptions on File Prior to Visit  Medication Sig Dispense Refill  . aspirin 81 MG tablet Take 81 mg by mouth daily.    . capsicum (MUSCLE RELIEF) 0.075 % topical cream Apply 1 application topically 2 (two) times daily as needed (muscle  pain).    Marland Kitchen diphenhydrAMINE (SOMINEX) 25 MG tablet Take 25 mg by mouth at bedtime as needed for sleep.    . hydrochlorothiazide (HYDRODIURIL) 25 MG tablet TAKE 1/2 TABLET BY MOUTH DAILY FOR BLOOD PRESSURE 30 tablet 3  . Psyllium (METAMUCIL PO) Take 1 packet by mouth daily.     Marland Kitchen tetrahydrozoline 0.05 % ophthalmic solution Place 1 drop into both eyes as needed (allergies/red eyes). VISINE    . varenicline (CHANTIX CONTINUING MONTH PAK) 1 MG tablet Take 1 tablet (1 mg total) by mouth 2 (two) times daily. 60 tablet 2  . varenicline (CHANTIX STARTING MONTH PAK) 0.5 MG X 11 & 1 MG X 42 tablet Take one 0.5 mg tablet daily for 3 days, then increase to one 0.5 mg tablet BID for 4 days, then increase to one 1 mg tablet twice daily. 53 tablet 0  . XARELTO 20 MG TABS tablet TAKE 1 TABLET(20 MG) BY MOUTH DAILY WITH DINNER 30 tablet 0   No current facility-administered medications on file prior to visit.    Allergies: Allergies  Allergen Reactions  . Other     SOME ADHESIVES CAUSE SKIN IRRITATION  - STATES PAPER TAPE OK.  STATES SOME EKG ELECTRODES AND SOME SKIN PATCHES ALSO CAUSE IRRITATION    FH: Family History  Problem Relation Age of Onset  .  Cancer Mother     unknown type but patient thinks it was bone marrow cancer  . Heart disease Sister   . Heart disease Brother   . Clotting disorder Brother   . Colon cancer Neg Hx   . Stomach cancer Neg Hx     SH: Social History   Social History  . Marital Status: Single    Spouse Name: N/A  . Number of Children: N/A  . 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 Smoking status: Current Some Day Smoker -- 0.40 packs/day for 40 years    Types: Cigarettes  . Smokeless tobacco: Never Used     Comment: ABOUT 5 CIGARETTES A DAY  . Alcohol Use: No  . Drug Use: No  . Sexual Activity: Not Asked   Other Topics Concern  . None   Social History Narrative   Patient does not regular  exercise. He has three boys. Daily caffeine use 2/day.    Review of Systems: Constitutional: Negative for fever, chills and weight loss.  Eyes: Negative for blurred vision.  Respiratory: Negative for cough and shortness of breath.  Cardiovascular: Negative for chest pain, palpitations and leg swelling.  Gastrointestinal: Negative for nausea, vomiting, abdominal pain, diarrhea, constipation and blood in stool.  Genitourinary: Negative for dysuria, urgency and frequency.  Musculoskeletal: Negative for myalgias and back pain.  Neurological: Negative for dizziness, weakness and headaches.     Objective:   Vital Signs: Filed Vitals:   12/30/15 1500  BP: 122/66  Pulse: 91  Temp: 98.3 F (36.8 C)  TempSrc: Oral  Height: 5\' 6"  (1.676 m)  Weight: 263 lb (119.296 kg)  SpO2: 100%      BP Readings from Last 3 Encounters:  12/30/15 122/66  12/15/15 148/88  11/03/15 145/77    Physical Exam: Constitutional: Vital signs reviewed.  Patient is in NAD and cooperative with exam.  Head: Normocephalic and atraumatic. Eyes: EOMI, conjunctivae nl, no scleral icterus.  Neck: Supple. Normal thyroid, no nodules palpated.  Cardiovascular: RRR, no MRG. Pulmonary/Chest: normal effort, CTAB, no wheezes, rales, or rhonchi. Abdominal: Soft. NT/ND +BS. Neurological: A&O x3, cranial nerves II-XII are grossly intact, moving all extremities. Extremities: 2+DP b/l; trace LE pitting edema.  Onychomycosis.  Skin: Warm, dry and intact.    Assessment & Plan:   Assessment and plan was discussed and formulated with my attending.

## 2015-12-30 NOTE — Patient Instructions (Signed)
Thank you for your visit today.   Please return to the internal medicine clinic in 4-5 month(s) or sooner if needed.     I have made the following additions/changes to your medications: Continue current medications.   I have made the following referrals for you: I will refer you to podiatry for nail trimming.   I will check your thyroid level today.   Please be sure to bring all of your medications with you to every visit; this includes herbal supplements, vitamins, eye drops, and any over-the-counter medications.   Should you have any questions regarding your medications and/or any new or worsening symptoms, please be sure to call the clinic at [phone].   If you believe that you are suffering from a life threatening condition or one that may result in the loss of limb or function, then you should call 911 and proceed to the nearest Emergency Department.   A healthy lifestyle and preventative care can promote health and wellness.   Maintain regular health, dental, and eye exams.  Eat a healthy diet. Foods like vegetables, fruits, whole grains, low-fat dairy products, and lean protein foods contain the nutrients you need without too many calories. Decrease your intake of foods high in solid fats, added sugars, and salt. Get information about a proper diet from your caregiver, if necessary.  Regular physical exercise is one of the most important things you can do for your health. Most adults should get at least 150 minutes of moderate-intensity exercise (any activity that increases your heart rate and causes you to sweat) each week. In addition, most adults need muscle-strengthening exercises on 2 or more days a week.   Maintain a healthy weight. The body mass index (BMI) is a screening tool to identify possible weight problems. It provides an estimate of body fat based on height and weight. Your caregiver can help determine your BMI, and can help you achieve or maintain a healthy  weight. For adults 20 years and older:  A BMI below 18.5 is considered underweight.  A BMI of 18.5 to 24.9 is normal.  A BMI of 25 to 29.9 is considered overweight.  A BMI of 30 and above is considered obese.

## 2015-12-31 LAB — TSH: TSH: 1.14 u[IU]/mL (ref 0.450–4.500)

## 2015-12-31 NOTE — Progress Notes (Signed)
Internal Medicine Clinic Attending  Case discussed with Dr. Gill at the time of the visit.  We reviewed the resident's history and exam and pertinent patient test results.  I agree with the assessment, diagnosis, and plan of care documented in the resident's note.  

## 2016-01-13 ENCOUNTER — Ambulatory Visit: Payer: BLUE CROSS/BLUE SHIELD | Admitting: Podiatry

## 2016-01-22 ENCOUNTER — Other Ambulatory Visit: Payer: Self-pay | Admitting: Internal Medicine

## 2016-01-23 NOTE — Telephone Encounter (Signed)
Last seen 12/30/15. No f/u appt scheduled.

## 2016-01-29 ENCOUNTER — Encounter: Payer: Self-pay | Admitting: Podiatry

## 2016-01-29 ENCOUNTER — Ambulatory Visit (INDEPENDENT_AMBULATORY_CARE_PROVIDER_SITE_OTHER): Payer: BLUE CROSS/BLUE SHIELD | Admitting: Podiatry

## 2016-01-29 VITALS — BP 140/77 | HR 82 | Resp 12

## 2016-01-29 DIAGNOSIS — B351 Tinea unguium: Secondary | ICD-10-CM

## 2016-01-29 DIAGNOSIS — M79676 Pain in unspecified toe(s): Secondary | ICD-10-CM

## 2016-01-29 NOTE — Progress Notes (Signed)
   Subjective:    Patient ID: Alicia Martinez, female    DOB: 1956/03/13, 60 y.o.   MRN: [id number]  HPI: She presents today with a chief concern of painful hallux nails bilateral that are thick and discolored. She has tried over-the-counter fungal preparations which have failed to alleviate her symptoms. She would like for [place] to take a look at this.    Review of Systems  Respiratory: Positive for wheezing.   Cardiovascular: Positive for leg swelling.  Musculoskeletal: Positive for myalgias and joint swelling.  Skin: Positive for color change.  All other systems reviewed and are negative.      Objective:   Physical Exam: Vital signs are stable she is alert and oriented 3. Pulses are palpable. Neurologic sensorium is intact. Deep tendon reflexes are intact. Muscle strength is 5 over 5 dorsiflexion plantar flexors and inverters and evertors onto the musculature is intact. Orthopedic evaluation and x-rays all joints distal to the ankle for range of motion without crepitation. Cutaneous evaluation of straight supple well-hydrated cutis toenails are thick yellow dystrophic onychomycotic hallux primarily bilateral.        Assessment & Plan:  Onychomycosis nail dystrophy.  Plan: Samples of the skin and nail were taken today sent for pathologic evaluation we will notify her once those results come in.

## 2016-02-20 ENCOUNTER — Telehealth: Payer: Self-pay | Admitting: *Deleted

## 2016-02-20 NOTE — Telephone Encounter (Addendum)
Dr. Milinda Pointer reviewed fungal culture 01/29/2016 as +, ordered pt to make an appt to discuss treatment.  Left message with orders.

## 2016-02-26 ENCOUNTER — Encounter: Payer: BLUE CROSS/BLUE SHIELD | Admitting: Podiatry

## 2016-02-26 NOTE — Progress Notes (Signed)
This encounter was created in error - please disregard.

## 2016-03-08 ENCOUNTER — Encounter: Payer: Self-pay | Admitting: *Deleted

## 2016-03-09 ENCOUNTER — Encounter: Payer: Self-pay | Admitting: Podiatry

## 2016-03-09 ENCOUNTER — Ambulatory Visit: Payer: BLUE CROSS/BLUE SHIELD | Admitting: Podiatry

## 2016-03-11 ENCOUNTER — Telehealth: Payer: Self-pay

## 2016-03-11 NOTE — Telephone Encounter (Signed)
Alicia Martinez is a 60 y.o. female who was contacted via telephone for monitoring of rivaroxaban (Xarelto) therapy.    ASSESSMENT Indication(s): Hx of PE and DVT Duration: indefinite  Labs:    Component Value Date/Time   AST 13 11/17/2015 1318   ALT 18 11/17/2015 1318   NA 143 11/17/2015 1318   NA 139 11/04/2014 1653   K 4.5 11/17/2015 1318   CL 100 11/17/2015 1318   CO2 24 11/17/2015 1318   GLUCOSE 98 11/17/2015 1318   GLUCOSE 105* 11/04/2014 1653   BUN 12 11/17/2015 1318   BUN 12 11/04/2014 1653   CREATININE 0.58 11/17/2015 1318   CREATININE 0.63 05/08/2013 1055   CALCIUM 9.4 11/17/2015 1318   GFRNONAA 103 11/17/2015 1318   GFRNONAA 88 08/07/2012 1656   GFRAA 118 11/17/2015 1318   GFRAA >89 08/07/2012 1656   WBC 4.8 11/17/2015 1318   WBC 5.0 11/04/2014 1653   HGB 13.2 11/04/2014 1653   HCT 36.6 11/17/2015 1318   HCT 40.0 11/04/2014 1653   PLT 304 11/17/2015 1318   PLT 294 11/04/2014 1653    rivaroxaban (Xarelto) Dose: 20 mg daily  Safety: Patient has had recent bleeding/thromboembolic events. Patient reports having nose bleeds that have gotten worse (increased amount of blood) the last 4 months. However, she has been on this medication for 1.5 years. Patient reports no signs of symptoms of thromboembolism. Medication changes: no.  Adherence: Patient reports adherence challenges. Patient states that the cost is an issue sometimes and gets medication late if she cannot afford it. Patient does correctly recite the dose. Contacted pharmacy and records indicate refills are consistent. Past refills (one month supply): 02/21/16, 01/23/16, 4/4/7.  Patient Instructions: Patient advised to contact clinic or seek medical attention if signs/symptoms of bleeding or thromboembolism occur. Patient verbalized understanding by repeating back information.  Follow-up Labs up to date. Next appointment not scheduled.  Angelena Form PharmD Candidate  03/11/2016, 1:04 PM

## 2016-05-17 ENCOUNTER — Other Ambulatory Visit: Payer: Self-pay | Admitting: *Deleted

## 2016-05-17 MED ORDER — HYDROCHLOROTHIAZIDE 25 MG PO TABS: ORAL_TABLET | ORAL | 3 refills | Status: DC

## 2016-06-01 ENCOUNTER — Other Ambulatory Visit: Payer: Self-pay | Admitting: *Deleted

## 2016-06-04 ENCOUNTER — Other Ambulatory Visit: Payer: Self-pay

## 2016-06-04 MED ORDER — RIVAROXABAN 20 MG PO TABS: ORAL_TABLET | ORAL | 3 refills | Status: DC

## 2016-06-04 NOTE — Telephone Encounter (Signed)
Requesting Xarelto to be filled @ [facility] on [address]

## 2016-06-15 ENCOUNTER — Telehealth: Payer: Self-pay | Admitting: Internal Medicine

## 2016-06-15 NOTE — Telephone Encounter (Signed)
APT. REMINDER CALL, LMTCB °

## 2016-06-16 ENCOUNTER — Encounter: Payer: BLUE CROSS/BLUE SHIELD | Admitting: Internal Medicine

## 2016-09-07 ENCOUNTER — Other Ambulatory Visit: Payer: Self-pay | Admitting: Internal Medicine

## 2016-09-07 DIAGNOSIS — Z1231 Encounter for screening mammogram for malignant neoplasm of breast: Secondary | ICD-10-CM

## 2016-10-11 ENCOUNTER — Ambulatory Visit: Payer: BLUE CROSS/BLUE SHIELD

## 2016-10-20 ENCOUNTER — Ambulatory Visit
Admission: RE | Admit: 2016-10-20 | Discharge: 2016-10-20 | Disposition: A | Discharge status: Home / Self Care | Payer: BLUE CROSS/BLUE SHIELD | Source: Ambulatory Visit | Attending: Internal Medicine | Admitting: Internal Medicine

## 2016-10-20 DIAGNOSIS — Z1231 Encounter for screening mammogram for malignant neoplasm of breast: Secondary | ICD-10-CM

## 2016-11-29 ENCOUNTER — Other Ambulatory Visit: Payer: Self-pay | Admitting: Internal Medicine

## 2016-12-14 ENCOUNTER — Ambulatory Visit: Payer: BLUE CROSS/BLUE SHIELD | Admitting: Adult Health

## 2017-01-05 ENCOUNTER — Encounter: Payer: Self-pay | Admitting: Internal Medicine

## 2017-01-20 ENCOUNTER — Telehealth: Payer: Self-pay | Admitting: Internal Medicine

## 2017-01-20 NOTE — Telephone Encounter (Signed)
APT. REMINDER CALL, NO ANSWER, NO VOICEMAIL °

## 2017-01-21 ENCOUNTER — Encounter: Payer: BLUE CROSS/BLUE SHIELD | Admitting: Internal Medicine

## 2017-02-15 ENCOUNTER — Encounter: Payer: Self-pay | Admitting: Internal Medicine

## 2017-03-15 ENCOUNTER — Other Ambulatory Visit: Payer: Self-pay | Admitting: Internal Medicine

## 2017-03-17 ENCOUNTER — Encounter: Payer: Self-pay | Admitting: Internal Medicine

## 2017-04-12 ENCOUNTER — Encounter: Payer: Self-pay | Admitting: Internal Medicine

## 2017-04-12 ENCOUNTER — Ambulatory Visit (INDEPENDENT_AMBULATORY_CARE_PROVIDER_SITE_OTHER): Payer: BLUE CROSS/BLUE SHIELD | Admitting: Internal Medicine

## 2017-04-12 VITALS — BP 130/76 | HR 80 | Ht 66.0 in | Wt 259.2 lb

## 2017-04-12 DIAGNOSIS — Z85048 Personal history of other malignant neoplasm of rectum, rectosigmoid junction, and anus: Secondary | ICD-10-CM

## 2017-04-12 DIAGNOSIS — Z7901 Long term (current) use of anticoagulants: Secondary | ICD-10-CM | POA: Diagnosis not present

## 2017-04-12 DIAGNOSIS — Z86010 Personal history of colonic polyps: Secondary | ICD-10-CM | POA: Diagnosis not present

## 2017-04-12 DIAGNOSIS — Z86711 Personal history of pulmonary embolism: Secondary | ICD-10-CM | POA: Diagnosis not present

## 2017-04-12 MED ORDER — NA SULFATE-K SULFATE-MG SULF 17.5-3.13-1.6 GM/177ML PO SOLN
1.0000 | Freq: Once | ORAL | 0 refills | Status: DC
Start: 2017-04-12 — End: 2017-04-12

## 2017-04-12 NOTE — Patient Instructions (Addendum)
You have been scheduled for a colonoscopy. Please follow written instructions given to you at your visit today.  Please pick up your prep supplies at the pharmacy. If you use inhalers (even only as needed), please bring them with you on the day of your procedure.   I appreciate the opportunity to care for you. Carl Gessner, MD, FACG 

## 2017-04-12 NOTE — Progress Notes (Signed)
Alicia Martinez 61 y.o. [birth year] [id number]  Assessment & Plan:   Encounter Diagnoses  Name Primary?  . Personal history of rectal cancer Yes  . Hx of colonic polyp   . Long term current use of anticoagulant Xarelto   . History of pulmonary embolism    Plan for surveillance colonoscopy. It's been 3 years since her last. I will have her take her Xarelto up until the night before her prep today. We will clarify with the internal medicine clinic as best we can she does not have a provider yet but she is reestablishing care there week. I did explain the rare but real risk of a blood clot again off Xarelto but we will try to minimize his time off but it is appropriate to hold this for her colonoscopy.The risks and benefits as well as alternatives of endoscopic procedure(s) have been discussed and reviewed. All questions answered. The patient agrees to proceed.   I appreciate the opportunity to care for this patient.  PCP note of  7/23 reviewed and no objections to the plan to hold Xarelto noted.  Gatha Mayer, MD, [facility]    Subjective:   Chief Complaint:History of colon polyp and rectal cancer time for colonoscopy  HPI The patient is here last been seen over 3 years ago, colonoscopy at that time demonstrated a recalcitrant polyp of the appendix which had to be removed surgically and was a sessile serrated polyp. She also has a history of rectal cancer diagnosed and treated in 2010. She denies any GI problems other than occasional urgent defecation. She takes Xarelto because of a history of DVTs and pulmonary emboli. It is prescribed through her primary care provider it's been a year or more since she has been and she is reestablishing care next week, I do not know which provider yet. There is none identified in the appointment information. Allergies  Allergen Reactions  . Other     SOME ADHESIVES CAUSE SKIN IRRITATION  - STATES PAPER TAPE OK.  STATES SOME EKG ELECTRODES AND  SOME SKIN PATCHES ALSO CAUSE IRRITATION   Current Meds  Medication Sig  . aspirin 81 MG tablet Take 81 mg by mouth daily.  . capsicum (MUSCLE RELIEF) 0.075 % topical cream Apply 1 application topically 2 (two) times daily as needed (muscle pain).  Marland Kitchen diphenhydrAMINE (SOMINEX) 25 MG tablet Take 25 mg by mouth at bedtime as needed for sleep.  . hydrochlorothiazide (HYDRODIURIL) 25 MG tablet TAKE 1/2 TABLET BY MOUTH DAILY FOR BLOOD PRESSURE  . oxyCODONE (OXY IR/ROXICODONE) 5 MG immediate release tablet Take 1 tablet by mouth 3 (three) times daily as needed.  . Psyllium (METAMUCIL PO) Take 1 packet by mouth daily.   . rivaroxaban (XARELTO) 20 MG TABS tablet TAKE 1 TABLET(20 MG) BY MOUTH DAILY WITH DINNER  . tetrahydrozoline 0.05 % ophthalmic solution Place 1 drop into both eyes as needed (allergies/red eyes). [place]   Past Medical History:  Diagnosis Date  . Allergy   . Anemia   . Arthritis    DJD, ARTHRITIS  . Carotid artery plaque 08-30-13   PER CAROTID DOPPLER STUDY  . Deep vein thrombosis (HCC)    left lower extremity  . Diverticulitis large intestine   . Heart palpitations    HX OF PALPITATIONS AND SOME DIZZINESS - EVALUATED BY CARDIOLOGIST DR. ROSS - OFFICE NOTES IS EPIC 08/24/13 - "EVENT MONITOR SHOWED NO SIGNIFICANT ARRHYTHMIA"  . History of rectal cancer    T1N0 resected 08/2009  .  Hypertension   . Infectious colitis   . Obesity   . Pain    LOWER BACK AND RT KNEE AND BOTH SHOULDERS - LEFT SHOULDER PAIN WORSE--PT TOLD ARTHRITIS  . Pulmonary embolism (HCC)    LAST ONE WAS 1995- ON CHRONIC COUMADIN  . Sleep apnea 08/09/2012   Sleep study 02/2013 : Severe obstructive sleep apnea/hypopnea syndrome, AHI 88.7, desat to 84%. CPAP titration to 15 CWP, AHI 0 per hour. Medium ResMed Mirage Quattro full-face mask with heated humidifier.       Past Surgical History:  Procedure Laterality Date  . ABDOMINAL HYSTERECTOMY    . CECOSTOMY N/A 03/15/2014   Procedure: partial CECECTOMY;   Surgeon: Adin Hector, MD;  Location: [facility];  Service: General;  Laterality: N/A;  . COLONOSCOPY    . LAPAROSCOPIC APPENDECTOMY N/A 03/15/2014   Procedure: APPENDECTOMY LAPAROSCOPIC;  Surgeon: Adin Hector, MD;  Location: [facility];  Service: General;  Laterality: N/A;  . LAPAROSCOPIC LYSIS OF ADHESIONS N/A 03/15/2014   Procedure: LAPAROSCOPIC LYSIS OF ADHESIONS;  Surgeon: Adin Hector, MD;  Location: [facility];  Service: General;  Laterality: N/A;  . TRANSANAL RECTAL RESECTION  08/2009   T1N0 rectal cancer Dr. Johney Maine  . TUBAL LIGATION     Social History   Social History  . Marital status: Single    Spouse name: N/A  . Number of children: N/A  . Years of education: N/A   Occupational History  .  [employer], structural   Social History Main Topics  . Smoking status: Current Some Day Smoker    Packs/day: 0.40    Years: 40.00    Types: Cigarettes  . Smokeless tobacco: Never Used     Comment: ABOUT 5 CIGARETTES A DAY  . Alcohol use No  . Drug use: No  . Sexual activity: Not on file   Other Topics Concern  . Not on file   Social History Narrative   Patient does not regular exercise. He has three boys. Daily caffeine use 2/day.   family history includes Cancer in her mother; Clotting disorder in her brother; Heart disease in her brother and sister.   Review of Systems Other review of systems negative.  Objective:   Physical Exam BP 130/76   Pulse 80   Ht 5\' 6"  (1.676 m)   Wt 259 lb 3.2 oz (117.6 kg)   BMI 41.84 kg/m  Lungs clear RRR s1s2 no rmg Eyes anicteric   20 minutes of time spent with the patient today. Half of in counseling and coordination Of care

## 2017-04-18 ENCOUNTER — Ambulatory Visit (INDEPENDENT_AMBULATORY_CARE_PROVIDER_SITE_OTHER): Payer: BLUE CROSS/BLUE SHIELD | Admitting: Internal Medicine

## 2017-04-18 VITALS — BP 124/71 | HR 70 | Temp 98.2°F | Wt 261.1 lb

## 2017-04-18 DIAGNOSIS — Z6841 Body Mass Index (BMI) 40.0 and over, adult: Secondary | ICD-10-CM | POA: Diagnosis not present

## 2017-04-18 DIAGNOSIS — F17210 Nicotine dependence, cigarettes, uncomplicated: Secondary | ICD-10-CM

## 2017-04-18 DIAGNOSIS — M1990 Unspecified osteoarthritis, unspecified site: Secondary | ICD-10-CM | POA: Diagnosis not present

## 2017-04-18 DIAGNOSIS — Z86010 Personal history of colonic polyps: Secondary | ICD-10-CM | POA: Diagnosis not present

## 2017-04-18 DIAGNOSIS — Z86718 Personal history of other venous thrombosis and embolism: Secondary | ICD-10-CM

## 2017-04-18 DIAGNOSIS — Z85048 Personal history of other malignant neoplasm of rectum, rectosigmoid junction, and anus: Secondary | ICD-10-CM

## 2017-04-18 DIAGNOSIS — Z7901 Long term (current) use of anticoagulants: Secondary | ICD-10-CM

## 2017-04-18 DIAGNOSIS — I10 Essential (primary) hypertension: Secondary | ICD-10-CM | POA: Diagnosis not present

## 2017-04-18 DIAGNOSIS — Z86711 Personal history of pulmonary embolism: Secondary | ICD-10-CM | POA: Diagnosis not present

## 2017-04-18 DIAGNOSIS — D126 Benign neoplasm of colon, unspecified: Secondary | ICD-10-CM

## 2017-04-18 DIAGNOSIS — Z760 Encounter for issue of repeat prescription: Secondary | ICD-10-CM

## 2017-04-18 DIAGNOSIS — Z79899 Other long term (current) drug therapy: Secondary | ICD-10-CM

## 2017-04-18 DIAGNOSIS — Z0000 Encounter for general adult medical examination without abnormal findings: Secondary | ICD-10-CM

## 2017-04-18 DIAGNOSIS — Z8639 Personal history of other endocrine, nutritional and metabolic disease: Secondary | ICD-10-CM

## 2017-04-18 MED ORDER — HYDROCHLOROTHIAZIDE 25 MG PO TABS: 25.0000 mg | ORAL_TABLET | Freq: Every day | ORAL | 3 refills | Status: AC

## 2017-04-18 NOTE — Progress Notes (Signed)
   CC: Medication management  HPI:  [name] is a 61 y.o. F with past medical history as described below who presents to the clinic for medication management and follow up of chronic medical conditions.   She was last seen in April 2017 and would like to get re-established and refill her medications. She is currently taking HCTZ 25 mg and Xarelto 20 mg for hx of DVT/PE.  Overall she states she is feeling well. She notes a feeling of numbness in her bilateral thighs which has been ongoing for weeks-months. She also notes an intermittent tingling sensation around her wrist and hands. She denies weakness, numbness/tingling in her feet, back pain.   She notes infrequent nosebleeds occurring roughly once every two months, stop spontaneously after 10-15 minutes. No other bleeding events reported.      Past Medical History:  Diagnosis Date  . Allergy   . Anemia   . Arthritis    DJD, ARTHRITIS  . Carotid artery plaque 08-30-13   PER CAROTID DOPPLER STUDY  . Deep vein thrombosis (HCC)    left lower extremity  . Diverticulitis large intestine   . Heart palpitations    HX OF PALPITATIONS AND SOME DIZZINESS - EVALUATED BY CARDIOLOGIST DR. ROSS - OFFICE NOTES IS EPIC 08/24/13 - "EVENT MONITOR SHOWED NO SIGNIFICANT ARRHYTHMIA"  . History of rectal cancer    T1N0 resected 08/2009  . Hypertension   . Infectious colitis   . Obesity   . Pain    LOWER BACK AND RT KNEE AND BOTH SHOULDERS - LEFT SHOULDER PAIN WORSE--PT TOLD ARTHRITIS  . Pulmonary embolism (HCC)    LAST ONE WAS 1995- ON CHRONIC COUMADIN  . Sleep apnea 08/09/2012   Sleep study 02/2013 : Severe obstructive sleep apnea/hypopnea syndrome, AHI 88.7, desat to 84%. CPAP titration to 15 CWP, AHI 0 per hour. Medium ResMed Mirage Quattro full-face mask with heated humidifier.       Review of Systems:  Review of Systems  Constitutional: Negative for fever and weight loss.  Musculoskeletal: Positive for joint pain (chronic, OA).    Neurological: Negative for tingling and focal weakness.     Physical Exam:  Vitals:   04/18/17 0848  BP: (!) 152/72  Pulse: 76  Temp: 98.2 F (36.8 C)  TempSrc: Oral  SpO2: 100%  Weight: 261 lb 1.6 oz (118.4 kg)   Physical Exam  Constitutional: She is oriented to person, place, and time. She appears well-developed and well-nourished.  Abdominal: Soft. She exhibits no distension. There is no tenderness.  Neurological: She is alert and oriented to person, place, and time. No cranial nerve deficit.  Reported decreased sensation over anterior thigh, normal sensation in lower legs, upper extremities, 5/5 strength in UEs and LEs   Skin: Skin is warm and dry.    Assessment & Plan:   See Encounters Tab for problem based charting.  Patient seen with Dr. Lynnae January

## 2017-04-18 NOTE — Patient Instructions (Addendum)
Thank you for coming in Alicia Martinez.   We've refilled your blood pressure medicine- Hydrochlorothiazide. Keep taking 1 tablet daily.   For some of your numbness, a nerve may be compressed near your waistband and causing your leg to feel a little numb. When possible, try to wear loose fitting pants and avoid sitting for long periods. Weight loss should also help these symptoms, and help improve your overall health. Try to cut down on popsicles and increase your exercise as you're able. If you're having trouble we can set up an appointment with a nutritionist.

## 2017-04-19 LAB — LIPID PANEL
CHOL/HDL RATIO: 3.8 ratio (ref 0.0–4.4)
CHOLESTEROL TOTAL: 135 mg/dL (ref 100–199)
HDL: 36 mg/dL — AB (ref >39)
LDL Calculated: 86 mg/dL (ref 0–99)
TRIGLYCERIDES: 63 mg/dL (ref 0–149)
VLDL Cholesterol Cal: 13 mg/dL (ref 5–40)

## 2017-04-19 LAB — CMP14 + ANION GAP
A/G RATIO: 1.4 (ref 1.2–2.2)
ALT: 13 IU/L (ref 0–32)
AST: 13 IU/L (ref 0–40)
Albumin: 4 g/dL (ref 3.5–5.5)
Alkaline Phosphatase: 100 IU/L (ref 39–117)
Anion Gap: 14 mmol/L (ref 10.0–18.0)
BUN/Creatinine Ratio: 18 (ref 9–23)
BUN: 12 mg/dL (ref 6–24)
Bilirubin Total: 0.3 mg/dL (ref 0.0–1.2)
CALCIUM: 9.4 mg/dL (ref 8.7–10.2)
CO2: 27 mmol/L (ref 20–29)
Chloride: 102 mmol/L (ref 96–106)
Creatinine, Ser: 0.66 mg/dL (ref 0.57–1.00)
GFR calc Af Amer: 113 mL/min/{1.73_m2} (ref >59)
GFR, EST NON AFRICAN AMERICAN: 98 mL/min/{1.73_m2} (ref >59)
Globulin, Total: 2.8 g/dL (ref 1.5–4.5)
Glucose: 119 mg/dL — ABNORMAL HIGH (ref 65–99)
POTASSIUM: 3.8 mmol/L (ref 3.5–5.2)
Sodium: 143 mmol/L (ref 134–144)
Total Protein: 6.8 g/dL (ref 6.0–8.5)

## 2017-04-19 LAB — HEMOGLOBIN A1C
Est. average glucose Bld gHb Est-mCnc: 154 mg/dL
Hgb A1c MFr Bld: 7 % — ABNORMAL HIGH (ref 4.8–5.6)

## 2017-04-19 LAB — TSH: TSH: 1.19 u[IU]/mL (ref 0.450–4.500)

## 2017-04-19 NOTE — Progress Notes (Signed)
Internal Medicine Clinic Attending  I saw and evaluated the patient.  I personally confirmed the key portions of the history and exam documented by Dr. Harden and I reviewed pertinent patient test results.  The assessment, diagnosis, and plan were formulated together and I agree with the documentation in the resident's note.  

## 2017-04-19 NOTE — Assessment & Plan Note (Signed)
Pt was seen by GI 7/17 who scheduled a colonoscopy at the scheduled 3 yr interval based on previous poly and rectal cancer history. They have provided her instructions on how to take her coagulation in preparation for her procedure.

## 2017-04-19 NOTE — Assessment & Plan Note (Signed)
Her BMI is currently 42.14. Her thigh numbness may be due to compression of her femoral cutaneous nerve due to waistband pressure and her body habitus. She expresses a desire to lose weight, and identified popsicles as an unhealthy eating habit. She would like to attempt to improve her eating and exercise on her own, but understands she has nutrition, bariatric surgery referral options that we can make. Instructed her to wear lose fitting clothes, avoid prolonged sitting with increased pressure on her waistband to improve her thigh numbness. Successful weight loss will also likely improve her sx.

## 2017-04-19 NOTE — Assessment & Plan Note (Signed)
Today her BP was initially elevated at 152/72. On recheck after sitting in exam room, BP decreased to 124/71. At a recent GI clinic visit, her BP was 130/76. There is no indication to alter regimen with recorded values at target. She states she took her last tablet this morning. Provided refill for HCTZ, continue 25 mg daily

## 2017-04-19 NOTE — Assessment & Plan Note (Signed)
Pt has reported nose bleeds though these are infrequent and stop without any advanced intervention required. No other issues with bleeding. She also feels her thigh numbness may be related to a xarelto side effect though this is not a documented adverse reaction. The benefit of her xarelto outweighs her infrequent, minor nose bleeding events though we will continue to monitor, given return precautions for bleeding events.

## 2017-04-19 NOTE — Assessment & Plan Note (Addendum)
Colonoscopy scheduled. Will check routine screening labs today including lipid profile, A1c, TSH (also with history of thyroid nodules- normal TSH and unremarkable [place] previously), CMP  **Update: A1c came back elevated at 7.0, discussed results with patient and will see her in clinic for further discussion.

## 2017-04-20 ENCOUNTER — Telehealth: Payer: Self-pay

## 2017-04-20 NOTE — Telephone Encounter (Signed)
-----   Message from Gatha Mayer, MD sent at 04/20/2017  8:26 AM EDT ----- Regarding: Xarelto She saw a new PCP - they saw what our plans were for holding Xarelto and no objections raised so we will continue as planned

## 2017-04-20 NOTE — Telephone Encounter (Signed)
Just to elucidate so you want her to hold her xarelto 24 hours prior to procedure?  Thank you for your time.

## 2017-04-21 ENCOUNTER — Telehealth: Payer: Self-pay

## 2017-04-21 NOTE — Telephone Encounter (Signed)
Requesting lab results. Please call pt back.  

## 2017-04-22 NOTE — Telephone Encounter (Signed)
Patient informed and verbalized understanding the time to hold her xarelto.

## 2017-04-22 NOTE — Telephone Encounter (Signed)
Yes Last dose would be night before prep day

## 2017-05-05 NOTE — Telephone Encounter (Addendum)
It looks like Dr. Johny Chess spoke with the patient and discussed her new diabetes diagnosis. He would like for her to have an appointment in his continuity clinic next available. I hope the front office can get her an appointment in the next few weeks.

## 2017-05-05 NOTE — Telephone Encounter (Signed)
Butch Penny, could you please look this over and see if you can determine if pt needs to be seen this coming week in [facility]. She needs to be assigned a pcp and this may take awhile so if we could just make sure she comes in for needed management that would be good

## 2017-05-09 ENCOUNTER — Telehealth: Payer: Self-pay | Admitting: Dietician

## 2017-05-09 NOTE — Telephone Encounter (Signed)
Called patient to assess her desire to come in sooner to [facility] to see a different doctor. She says she does bnot feel the need for an appointment " I am not in a hurry" She does not want to be on medication, so has made dietary changes that she hopes will lower her blood sugar enough. She was made aware of [employer] coverage for dietary counseling and will call for an appointment as needed.  Advised her to call if she doesn't hear from our office with an appointment in the next few weeks.

## 2017-05-18 ENCOUNTER — Encounter: Payer: Self-pay | Admitting: Internal Medicine

## 2017-05-31 ENCOUNTER — Other Ambulatory Visit: Payer: Self-pay | Admitting: *Deleted

## 2017-05-31 MED ORDER — RIVAROXABAN 20 MG PO TABS: ORAL_TABLET | ORAL | 2 refills | Status: DC

## 2017-06-01 ENCOUNTER — Other Ambulatory Visit: Payer: Self-pay | Admitting: Internal Medicine

## 2017-06-01 ENCOUNTER — Encounter: Payer: Self-pay | Admitting: Internal Medicine

## 2017-06-01 ENCOUNTER — Ambulatory Visit (AMBULATORY_SURGERY_CENTER): Payer: BLUE CROSS/BLUE SHIELD | Admitting: Internal Medicine

## 2017-06-01 VITALS — BP 116/50 | HR 53 | Temp 98.0°F | Resp 16 | Ht 66.0 in | Wt 259.0 lb

## 2017-06-01 DIAGNOSIS — Z85048 Personal history of other malignant neoplasm of rectum, rectosigmoid junction, and anus: Secondary | ICD-10-CM

## 2017-06-01 DIAGNOSIS — D125 Benign neoplasm of sigmoid colon: Secondary | ICD-10-CM | POA: Diagnosis not present

## 2017-06-01 DIAGNOSIS — D123 Benign neoplasm of transverse colon: Secondary | ICD-10-CM | POA: Diagnosis not present

## 2017-06-01 MED ORDER — SODIUM CHLORIDE 0.9 % IV SOLN: 500.0000 mL | INTRAVENOUS | Status: DC

## 2017-06-01 NOTE — Op Note (Signed)
[place] Patient Name: Alicia Martinez Procedure Date: 06/01/2017 8:06 AM MRN: [id number] Endoscopist: Gatha Mayer , MD Age: 61 Referring MD:  Date of Birth: 05-28-1956 Gender: Female Account #: [id number] Procedure:                Colonoscopy Indications:              High risk colon cancer surveillance: Personal                            history of rectal cancer, Personal history of                            colonic polyps Medicines:                Propofol per Anesthesia, Monitored Anesthesia Care Procedure:                Pre-Anesthesia Assessment:                           - Prior to the procedure, a History and Physical                            was performed, and patient medications and                            allergies were reviewed. The patient's tolerance of                            previous anesthesia was also reviewed. The risks                            and benefits of the procedure and the sedation                            options and risks were discussed with the patient.                            All questions were answered, and informed consent                            was obtained. Prior Anticoagulants: The patient                            last took Xarelto (rivaroxaban) 2 days prior to the                            procedure. ASA Grade Assessment: II - A patient                            with mild systemic disease. After reviewing the                            risks and benefits, the patient was deemed in  satisfactory condition to undergo the procedure.                           After obtaining informed consent, the colonoscope                            was passed under direct vision. Throughout the                            procedure, the patient's blood pressure, pulse, and                            oxygen saturations were monitored continuously. The                            Colonoscope was  introduced through the anus and                            advanced to the the cecum, identified by                            appendiceal orifice and ileocecal valve. The                            colonoscopy was performed without difficulty. The                            patient tolerated the procedure well. The quality                            of the bowel preparation was good. The bowel                            preparation used was Miralax. The ileocecal valve,                            appendiceal orifice, and rectum were photographed. Scope In: 8:17:17 AM Scope Out: 8:29:22 AM Scope Withdrawal Time: 0 hours 10 minutes 46 seconds  Total Procedure Duration: 0 hours 12 minutes 5 seconds  Findings:                 The perianal and digital rectal examinations were                            normal.                           Three sessile polyps were found in the sigmoid                            colon and transverse colon. The polyps were                            diminutive in size. These polyps were removed with  a cold snare. Resection and retrieval were                            complete. Verification of patient identification                            for the specimen was done. Estimated blood loss was                            minimal.                           Multiple small and large-mouthed diverticula were                            found in the sigmoid colon.                           There was evidence of a prior end-to-end                            colo-rectal anastomosis in the rectum. This was                            patent and was characterized by healthy appearing                            mucosa.                           The exam was otherwise without abnormality on                            direct and retroflexion views. Complications:            No immediate complications. Estimated Blood Loss:     Estimated blood loss  was minimal. Impression:               - Three diminutive polyps in the sigmoid colon and                            in the transverse colon, removed with a cold snare.                            Resected and retrieved.                           - Diverticulosis in the sigmoid colon.                           - Patent end-to-end colo-rectal anastomosis,                            characterized by healthy appearing mucosa.                           -  The examination was otherwise normal on direct                            and retroflexion views.                           - Personal history of colonic polyps and rectal                            cancer Recommendation:           - Patient has a contact number available for                            emergencies. The signs and symptoms of potential                            delayed complications were discussed with the                            patient. Return to normal activities tomorrow.                            Written discharge instructions were provided to the                            patient.                           - Resume previous diet.                           - Continue present medications.                           - Resume Xarelto (rivaroxaban) at prior dose today.                           - Repeat colonoscopy is recommended for                            surveillance. The colonoscopy date will be                            determined after pathology results from today's                            exam become available for review. Gatha Mayer, MD 06/01/2017 8:34:53 AM This report has been signed electronically.

## 2017-06-01 NOTE — Progress Notes (Signed)
Called to room to assist during endoscopic procedure.  Patient ID and intended procedure confirmed with present staff. Received instructions for my participation in the procedure from the performing physician.  

## 2017-06-01 NOTE — Patient Instructions (Addendum)
I removed 3 small polyps today.  You also have a condition called diverticulosis - common and not usually a problem. Please read the handout provided.  I will let you know pathology results and when to have another routine colonoscopy by mail and/or My Chart.  Please restart Xarelto tonight.  I appreciate the opportunity to care for you. Gatha Mayer, MD, [facility]  HANDOUTS GIVEN; POLYPS AND DIVERTICULOSIS  YOU HAD AN ENDOSCOPIC PROCEDURE TODAY AT [facility]:   Refer to the procedure report that was given to you for any specific questions about what was found during the examination.  If the procedure report does not answer your questions, please call your gastroenterologist to clarify.  If you requested that your care partner not be given the details of your procedure findings, then the procedure report has been included in a sealed envelope for you to review at your convenience later.  YOU SHOULD EXPECT: Some feelings of bloating in the abdomen. Passage of more gas than usual.  Walking can help get rid of the air that was put into your GI tract during the procedure and reduce the bloating. If you had a lower endoscopy (such as a colonoscopy or flexible sigmoidoscopy) you may notice spotting of blood in your stool or on the toilet paper. If you underwent a bowel prep for your procedure, you may not have a normal bowel movement for a few days.  Please Note:  You might notice some irritation and congestion in your nose or some drainage.  This is from the oxygen used during your procedure.  There is no need for concern and it should clear up in a day or so.  SYMPTOMS TO REPORT IMMEDIATELY:   Following lower endoscopy (colonoscopy or flexible sigmoidoscopy):  Excessive amounts of blood in the stool  Significant tenderness or worsening of abdominal pains  Swelling of the abdomen that is new, acute  Fever of 100F or higher   For urgent or emergent issues, a  gastroenterologist can be reached at any hour by calling [phone].   DIET:  We do recommend a small meal at first, but then you may proceed to your regular diet.  Drink plenty of fluids but you should avoid alcoholic beverages for 24 hours.  ACTIVITY:  You should plan to take it easy for the rest of today and you should NOT DRIVE or use heavy machinery until tomorrow (because of the sedation medicines used during the test).    FOLLOW UP: Our staff will call the number listed on your records the next business day following your procedure to check on you and address any questions or concerns that you may have regarding the information given to you following your procedure. If we do not reach you, we will leave a message.  However, if you are feeling well and you are not experiencing any problems, there is no need to return our call.  We will assume that you have returned to your regular daily activities without incident.  If any biopsies were taken you will be contacted by phone or by letter within the next 1-3 weeks.  Please call us at [phone] if you have not heard about the biopsies in 3 weeks.    SIGNATURES/CONFIDENTIALITY: You and/or your care partner have signed paperwork which will be entered into your electronic medical record.  These signatures attest to the fact that that the information above on your After Visit Summary has been reviewed and is  understood.  Full responsibility of the confidentiality of this discharge information lies with you and/or your care-partner. 

## 2017-06-01 NOTE — Progress Notes (Signed)
To PACU VSS. Report to [email or web address] 

## 2017-06-02 ENCOUNTER — Telehealth: Payer: Self-pay | Admitting: *Deleted

## 2017-06-02 NOTE — Telephone Encounter (Signed)
  Follow up Call-  Call back number 06/01/2017  Post procedure Call Back phone  # [phone]  Permission to leave phone message Yes  Some recent data might be hidden     Patient questions:  Do you have a fever, pain , or abdominal swelling? No. Pain Score  0 *  Have you tolerated food without any problems? Yes.    Have you been able to return to your normal activities? Yes.    Do you have any questions about your discharge instructions: Diet   No. Medications  No. Follow up visit  No.  Do you have questions or concerns about your Care? No.  Actions: * If pain score is 4 or above: No action needed, pain <4.

## 2017-06-10 ENCOUNTER — Encounter: Payer: Self-pay | Admitting: Internal Medicine

## 2017-06-28 ENCOUNTER — Encounter: Payer: BLUE CROSS/BLUE SHIELD | Admitting: Internal Medicine

## 2017-06-28 ENCOUNTER — Encounter: Payer: Self-pay | Admitting: Internal Medicine

## 2017-08-08 NOTE — Progress Notes (Addendum)
   CC: Diabetes follow-up  HPI:  [name] is a 61 y.o. F with a past medical history as described below who presents to the clinic for follow up of her diabetes.    Diabetes: At her recent visit in July patient was noted to have an A1c of 7.0.  Since that time she reports she has made various diet changes including decreasing intake of fried foods, popsicles, starches.  She is also been eating more fruits and vegetables and white meat.  She still drinks sodas, juices, tea.  She does not currently exercise though feels she stays active at work.  HTN: Patient is currently on HCTZ 25 mg daily denies any headache, vision changes, dizziness.  Arthritis: Patient notes a history of chronic pain due to arthritis.  For the last 1.5 months feels her right shoulder and left hip have been more painful than usual.  She is still able to go to work and perform all ADLs.  She is currently being seen by pain specialist and has an appointment tomorrow. Taking oxycodone 5 mg.  Past Medical History:  Diagnosis Date  . Allergy   . Anemia   . Arthritis    DJD, ARTHRITIS  . Cancer ([place])    rectal Ca  . Carotid artery plaque 08-30-13   PER CAROTID DOPPLER STUDY  . Deep vein thrombosis (HCC)    left lower extremity  . Diverticulitis large intestine   . Heart palpitations    HX OF PALPITATIONS AND SOME DIZZINESS - EVALUATED BY CARDIOLOGIST DR. ROSS - OFFICE NOTES IS EPIC 08/24/13 - "EVENT MONITOR SHOWED NO SIGNIFICANT ARRHYTHMIA"  . History of rectal cancer    T1N0 resected 08/2009  . Hypertension   . Infectious colitis   . Obesity   . Pain    LOWER BACK AND RT KNEE AND BOTH SHOULDERS - LEFT SHOULDER PAIN WORSE--PT TOLD ARTHRITIS  . Pulmonary embolism (HCC)    LAST ONE WAS 1995- ON CHRONIC COUMADIN  . Sleep apnea 08/09/2012   wears CPAP   Review of Systems:  Review of Systems  Constitutional: Negative for chills and fever.  Eyes: Negative for blurred vision.  Musculoskeletal: Positive  for joint pain.  Neurological: Negative for sensory change.     Physical Exam:  Vitals:   08/09/17 1348  BP: (!) 147/72  Pulse: 82  Temp: 98.4 F (36.9 C)  TempSrc: Oral  SpO2: 100%  Weight: 256 lb 8 oz (116.3 kg)  Height: 5\' 6"  (1.676 m)   General: Obese female sitting in chair comfortably , no acute distress HEENT: Scar to R lateral labial commissure, normal conjuctiva  CV: Regular rate and rhythm, normal heart sounds Resp: Clear to auscultation bilaterally, normal work of breathing, no distress  Abd: Soft, +BS, non-tender  Extr: DP, PT pulses intact bilaterally  Neuro: Alert and oriented x3 Skin: Warm, dry      Assessment & Plan:   See Encounters Tab for problem based charting.  Patient seen with Dr. Evette Doffing

## 2017-08-09 ENCOUNTER — Encounter: Payer: Self-pay | Admitting: Internal Medicine

## 2017-08-09 ENCOUNTER — Ambulatory Visit: Payer: BLUE CROSS/BLUE SHIELD | Admitting: Internal Medicine

## 2017-08-09 ENCOUNTER — Encounter (INDEPENDENT_AMBULATORY_CARE_PROVIDER_SITE_OTHER): Payer: Self-pay

## 2017-08-09 ENCOUNTER — Other Ambulatory Visit: Payer: Self-pay

## 2017-08-09 VITALS — BP 140/80 | HR 77 | Temp 98.4°F | Ht 66.0 in | Wt 256.5 lb

## 2017-08-09 DIAGNOSIS — I82509 Chronic embolism and thrombosis of unspecified deep veins of unspecified lower extremity: Secondary | ICD-10-CM | POA: Diagnosis not present

## 2017-08-09 DIAGNOSIS — E119 Type 2 diabetes mellitus without complications: Secondary | ICD-10-CM

## 2017-08-09 DIAGNOSIS — I10 Essential (primary) hypertension: Secondary | ICD-10-CM

## 2017-08-09 DIAGNOSIS — M1990 Unspecified osteoarthritis, unspecified site: Secondary | ICD-10-CM

## 2017-08-09 DIAGNOSIS — I2782 Chronic pulmonary embolism: Secondary | ICD-10-CM

## 2017-08-09 DIAGNOSIS — M159 Polyosteoarthritis, unspecified: Secondary | ICD-10-CM

## 2017-08-09 DIAGNOSIS — Z23 Encounter for immunization: Secondary | ICD-10-CM | POA: Diagnosis not present

## 2017-08-09 DIAGNOSIS — M150 Primary generalized (osteo)arthritis: Secondary | ICD-10-CM | POA: Diagnosis not present

## 2017-08-09 DIAGNOSIS — Z7901 Long term (current) use of anticoagulants: Secondary | ICD-10-CM

## 2017-08-09 DIAGNOSIS — Z0000 Encounter for general adult medical examination without abnormal findings: Secondary | ICD-10-CM

## 2017-08-09 LAB — POCT GLYCOSYLATED HEMOGLOBIN (HGB A1C): Hemoglobin A1C: 6.7

## 2017-08-09 LAB — GLUCOSE, CAPILLARY: GLUCOSE-CAPILLARY: 113 mg/dL — AB (ref 65–99)

## 2017-08-09 NOTE — Assessment & Plan Note (Signed)
Today, her initial blood pressure was 147/72 which decreased upon recheck after resting in room.  Last blood pressure at colonoscopy had systolic 761.  Given her diabetes diagnosis, starting an ACE/ARB or transitioning from her HCTZ to an ACE/ARB it may be beneficial.  We will hold off on changes today and follow-up urine microalbumin and blood pressure on follow-up to inform potential changes

## 2017-08-09 NOTE — Assessment & Plan Note (Signed)
Next colonoscopy due 2021.  Flu shot provided at today's visit

## 2017-08-09 NOTE — Assessment & Plan Note (Signed)
Patient notes increased arthritic pain primarily of her right shoulder and left hip for the last month and a half.  She currently follows with a pain specialist who prescribes oxycodone 5 mg and has an appointment tomorrow.  Will defer management to this provider and appointment.

## 2017-08-09 NOTE — Assessment & Plan Note (Signed)
The patient has lost 5 pounds since her visit in July and 3 since September.  Hopefully she will continue with her lifestyle changes and successfully decrease her weight.  This would likely help control of her diabetes, hypertension, and her arthritic symptoms.  We continue to encourage these changes as outlined in diabetes A&P.

## 2017-08-09 NOTE — Assessment & Plan Note (Signed)
Screening labs in July revealed A1c 7.0 consistent with diagnosis of diabetes.  At that time, the patient wished to implement dietary changes.  She has successfully made several positive changes to her diet as outlined in HPI.  Her A1c today has decreased to 6.7 and is at goal.  She would like to continue to improve her A1c and lose weight and will attempt to drink more water in place of drinks with high sugar content.  She was also encouraged to exercise.  We will continue to manage her diabetes with diet control and implement recommended screening for diabetic patients.  --Urine microalbumin: Cr --Referral for eye exam --Diabetic foot exam  --F/u in 3 months

## 2017-08-09 NOTE — Patient Instructions (Addendum)
It was great to see you today Alicia Martinez.   Keep up the great work with your diet changes examination point your A1c for diabetes is under the goal of 7.  Drinking more water and avoiding juice, tea, and soda can help it come even lower and help bring your weight down.  He can also try to walk for exercise, especially if your arthritis is doing well after your pain appointment tomorrow.  We also checked a urine lab and put in a referral for an eye exam which help monitor your diabetes.  You can stop taking aspirin since you are on the Xarelto. We will see you back in three months to see how you're doing

## 2017-08-09 NOTE — Assessment & Plan Note (Signed)
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  Aspirin was on her medication list as primary prevention, per patient report this was started after a carotid ultrasound in 2015.  Chart review shows that she had no significant stenosis with all areas less than 50%.  She was advised to discontinue aspirin as she is on rivaroxaban and it was removed from her medication list.

## 2017-08-10 ENCOUNTER — Other Ambulatory Visit: Payer: Self-pay

## 2017-08-10 LAB — MICROALBUMIN / CREATININE URINE RATIO
CREATININE, UR: 113.7 mg/dL
MICROALB/CREAT RATIO: 11.3 mg/g{creat} (ref 0.0–30.0)
Microalbumin, Urine: 12.9 ug/mL

## 2017-08-10 NOTE — Progress Notes (Signed)
Internal Medicine Clinic Attending  I saw and evaluated the patient.  I personally confirmed the key portions of the history and exam documented by Dr. Harden and I reviewed pertinent patient test results.  The assessment, diagnosis, and plan were formulated together and I agree with the documentation in the resident's note.  

## 2017-09-28 ENCOUNTER — Other Ambulatory Visit: Payer: Self-pay | Admitting: Student in an Organized Health Care Education/Training Program

## 2017-10-07 ENCOUNTER — Other Ambulatory Visit: Payer: Self-pay | Admitting: Surgical Oncology

## 2017-11-08 ENCOUNTER — Encounter: Payer: Self-pay | Admitting: Internal Medicine

## 2017-11-08 ENCOUNTER — Other Ambulatory Visit: Payer: Self-pay

## 2017-11-08 ENCOUNTER — Ambulatory Visit: Payer: 59 | Admitting: Internal Medicine

## 2017-11-08 ENCOUNTER — Encounter (INDEPENDENT_AMBULATORY_CARE_PROVIDER_SITE_OTHER): Payer: Self-pay

## 2017-11-08 VITALS — BP 131/62 | HR 88 | Temp 98.0°F | Ht 66.0 in | Wt 258.6 lb

## 2017-11-08 DIAGNOSIS — E119 Type 2 diabetes mellitus without complications: Secondary | ICD-10-CM

## 2017-11-08 DIAGNOSIS — E669 Obesity, unspecified: Secondary | ICD-10-CM | POA: Diagnosis not present

## 2017-11-08 DIAGNOSIS — M25552 Pain in left hip: Secondary | ICD-10-CM | POA: Diagnosis not present

## 2017-11-08 DIAGNOSIS — I10 Essential (primary) hypertension: Secondary | ICD-10-CM

## 2017-11-08 DIAGNOSIS — Z0000 Encounter for general adult medical examination without abnormal findings: Secondary | ICD-10-CM

## 2017-11-08 DIAGNOSIS — G4733 Obstructive sleep apnea (adult) (pediatric): Secondary | ICD-10-CM

## 2017-11-08 DIAGNOSIS — Z23 Encounter for immunization: Secondary | ICD-10-CM | POA: Diagnosis not present

## 2017-11-08 DIAGNOSIS — Z79899 Other long term (current) drug therapy: Secondary | ICD-10-CM

## 2017-11-08 DIAGNOSIS — G8929 Other chronic pain: Secondary | ICD-10-CM

## 2017-11-08 DIAGNOSIS — Z6841 Body Mass Index (BMI) 40.0 and over, adult: Secondary | ICD-10-CM

## 2017-11-08 DIAGNOSIS — Z86711 Personal history of pulmonary embolism: Secondary | ICD-10-CM

## 2017-11-08 DIAGNOSIS — M1710 Unilateral primary osteoarthritis, unspecified knee: Secondary | ICD-10-CM

## 2017-11-08 DIAGNOSIS — Z7901 Long term (current) use of anticoagulants: Secondary | ICD-10-CM

## 2017-11-08 DIAGNOSIS — Z86718 Personal history of other venous thrombosis and embolism: Secondary | ICD-10-CM

## 2017-11-08 LAB — POCT GLYCOSYLATED HEMOGLOBIN (HGB A1C): HEMOGLOBIN A1C: 6.5

## 2017-11-08 LAB — GLUCOSE, CAPILLARY: Glucose-Capillary: 112 mg/dL — ABNORMAL HIGH (ref 65–99)

## 2017-11-08 NOTE — Patient Instructions (Addendum)
Good to see you again Alicia Martinez.  Your hip pain may actually be more likely to be inflammation of the tendons or muscles around the outside of your hip rather than the bones of your hip. As I mentioned the treatment is the same either way and we will try taking Tylenol regularly if you are able to. You can take 1000 mg every 6-8 hours (so up to 3-4 times a day). We will stay away from ibuprofen or aleve for now because of your xarelto. If this is not helping we can try an injection in the future.   Keep up the great work on your diabetes. If you are able to find an alternative drink to sweet tea and soda, I think this could help decrease your weight and improve your overall health even more.

## 2017-11-08 NOTE — Progress Notes (Signed)
   CC: Diabetes, HTN follow up   HPI:  [name] is a 62 y.o. F with a past medical history of obesity, DVT/PE on chronic anticoagulation, diabetes, HTN, OSA, osteoarthritis who presented to the clinic for follow up of her diabetes and chronic medial conditions and L hip pain.   DM/Obesity: At her last visit, she had decreased her weight and was hoping to continue to control her diabetes with her diet and lose weight which would also help her osteoarthritis and chronic pain. She reports she has attempted to continue eating vegetables and less sweets but continues to occasionally eat fried foods and sweet tea. She is hopeful to avoid medications for diabetes for as long as possible   HTN: Currently on HCTZ 25 mg daily and reports no side effects, no headaches or light-headedness.    L Hip Pain: She reports a several month history of L hip pain that has been constant since onset with waxing/waning severity. She notes it worsens with sitting and is constant throughout the day. She has not tried OTC medications and has not felt relief from oxycodone which is prescribed by a pain physician for her knee osteoarthritis. She denies a history of arthritis in her hip. She is able to go to work and perform all activities.    Past Medical History:  Diagnosis Date  . Allergy   . Anemia   . Arthritis    DJD, ARTHRITIS  . Cancer ([place])    rectal Ca  . Carotid artery plaque 08-30-13   PER CAROTID DOPPLER STUDY  . Deep vein thrombosis (HCC)    left lower extremity  . Diverticulitis large intestine   . Heart palpitations    HX OF PALPITATIONS AND SOME DIZZINESS - EVALUATED BY CARDIOLOGIST DR. ROSS - OFFICE NOTES IS EPIC 08/24/13 - "EVENT MONITOR SHOWED NO SIGNIFICANT ARRHYTHMIA"  . History of rectal cancer    T1N0 resected 08/2009  . Hypertension   . Infectious colitis   . Obesity   . Pain    LOWER BACK AND RT KNEE AND BOTH SHOULDERS - LEFT SHOULDER PAIN WORSE--PT TOLD ARTHRITIS  .  Pulmonary embolism (HCC)    LAST ONE WAS 1995- ON CHRONIC COUMADIN  . Sleep apnea 08/09/2012   wears CPAP   Review of Systems:  Review of Systems  Constitutional: Negative for weight loss.  Musculoskeletal: Positive for joint pain. Negative for falls.  Neurological: Negative for dizziness and headaches.     Physical Exam:  Vitals:   11/08/17 1629  BP: 131/62  Pulse: 88  Temp: 98 F (36.7 C)  TempSrc: Oral  SpO2: 100%  Weight: 258 lb 9.6 oz (117.3 kg)  Height: 5\' 6"  (1.676 m)   General: Sitting in chair comfortably, no acute distress HEENT: Remote scar to R labial commissure of mouth, moist mucus membranes CV: RRR, no murmur appreciated Resp: Clear breath sounds bilaterally, normal work of breathing, no distress  Abd: Soft, +BS, non-tender  Extr: TTP of lateral L hip over greater trochanter, full passive ROM with no significant increase in her pain with internal rotation. No pelvic tilt on trendelenburg testing Neuro: Alert and oriented x3  Skin: Warm, dry      Assessment & Plan:   See Encounters Tab for problem based charting.  Patient discussed with Dr. Evette Doffing

## 2017-11-09 NOTE — Assessment & Plan Note (Signed)
Pt experiencing hip pain which on exam is more lateral which is likely more consistent with greater trochanteric pain syndrome rather than developing osteoarthritis of the hip which would more likely present as an inguinal pain. She was advised to try Tylenol for pain and anti-inflammatory management. She is on Xarelto for DVT/PE and we will avoid NSAIDs. If her sx do not improve with conservative management, corticosteroid injection into the tissues surrounding the greater trochanter may provide benefit.   --Conservative management with Tylenol --Re-assess at follow up

## 2017-11-09 NOTE — Assessment & Plan Note (Signed)
A1c today has remained at goal at 6.5 compared to 6.7 at last visit. She has successfully implemented several dietary changes compared to her pre-diagnosis behavior, though her weight today has remained stable after an initial decrease. She was counseled on attempting to cut back on fried foods and drinks with a high sugar content such as sweet tea. She will explore healthier drink options and hopefully can continue to lose weight and control her diabetes through diet. An ophtho referral has been made but the pt states she needs to re-schedule her appt that was made for her eye exam. If she continues to remain at goal, visits may be able to be spaced to 6 months.   --Continue dietary modifications --F/u 3 months

## 2017-11-09 NOTE — Assessment & Plan Note (Signed)
-

## 2017-11-09 NOTE — Assessment & Plan Note (Signed)
BP today 131/62 on HCTZ 25 mg. An ACE/ARB would again be an option that may provide greater clinical benefit long term, however the pt is hesitant to change her regimen as she feels she is tolerating it well. She did not have microalbuminuria on last labs and will continue to counsel on these benefits in the future  --Cont current regimen

## 2017-11-09 NOTE — Progress Notes (Signed)
Internal Medicine Clinic Attending  Case discussed with Dr. Harden at the time of the visit.  We reviewed the resident's history and exam and pertinent patient test results.  I agree with the assessment, diagnosis, and plan of care documented in the resident's note.  

## 2017-11-09 NOTE — Addendum Note (Signed)
Addended by: Lalla Brothers T on: 11/09/2017 04:57 PM   Modules accepted: Level of Service

## 2017-11-10 ENCOUNTER — Other Ambulatory Visit: Payer: Self-pay | Admitting: Internal Medicine

## 2017-11-10 DIAGNOSIS — Z1231 Encounter for screening mammogram for malignant neoplasm of breast: Secondary | ICD-10-CM

## 2017-11-29 ENCOUNTER — Ambulatory Visit
Admission: RE | Admit: 2017-11-29 | Discharge: 2017-11-29 | Disposition: A | Discharge status: Home / Self Care | Payer: 59 | Source: Ambulatory Visit | Attending: *Deleted | Admitting: *Deleted

## 2017-11-29 DIAGNOSIS — Z1231 Encounter for screening mammogram for malignant neoplasm of breast: Secondary | ICD-10-CM

## 2017-12-08 ENCOUNTER — Emergency Department (HOSPITAL_COMMUNITY)
Admission: EM | Admit: 2017-12-08 | Discharge: 2017-12-08 | Disposition: A | Discharge status: Home / Self Care | Payer: 59 | Attending: Emergency Medicine | Admitting: Emergency Medicine

## 2017-12-08 ENCOUNTER — Emergency Department (HOSPITAL_COMMUNITY): Payer: 59

## 2017-12-08 ENCOUNTER — Emergency Department (HOSPITAL_COMMUNITY): Admission: EM | Admit: 2017-12-08 | Discharge: 2017-12-08 | Payer: 59 | Source: Home / Self Care

## 2017-12-08 ENCOUNTER — Encounter (HOSPITAL_COMMUNITY): Payer: Self-pay | Admitting: Emergency Medicine

## 2017-12-08 ENCOUNTER — Other Ambulatory Visit: Payer: Self-pay

## 2017-12-08 DIAGNOSIS — R040 Epistaxis: Secondary | ICD-10-CM | POA: Diagnosis not present

## 2017-12-08 DIAGNOSIS — Z85048 Personal history of other malignant neoplasm of rectum, rectosigmoid junction, and anus: Secondary | ICD-10-CM | POA: Insufficient documentation

## 2017-12-08 DIAGNOSIS — E119 Type 2 diabetes mellitus without complications: Secondary | ICD-10-CM | POA: Diagnosis not present

## 2017-12-08 DIAGNOSIS — F17210 Nicotine dependence, cigarettes, uncomplicated: Secondary | ICD-10-CM | POA: Insufficient documentation

## 2017-12-08 DIAGNOSIS — I10 Essential (primary) hypertension: Secondary | ICD-10-CM | POA: Diagnosis not present

## 2017-12-08 DIAGNOSIS — Z7901 Long term (current) use of anticoagulants: Secondary | ICD-10-CM | POA: Diagnosis not present

## 2017-12-08 DIAGNOSIS — Z79899 Other long term (current) drug therapy: Secondary | ICD-10-CM | POA: Diagnosis not present

## 2017-12-08 DIAGNOSIS — R42 Dizziness and giddiness: Secondary | ICD-10-CM | POA: Diagnosis present

## 2017-12-08 LAB — I-STAT TROPONIN, ED: TROPONIN I, POC: 0 ng/mL (ref 0.00–0.08)

## 2017-12-08 LAB — PROTIME-INR
INR: 1.31
PROTHROMBIN TIME: 16.2 s — AB (ref 11.4–15.2)

## 2017-12-08 LAB — COMPREHENSIVE METABOLIC PANEL
ALT: 30 U/L (ref 14–54)
AST: 19 U/L (ref 15–41)
Albumin: 3.8 g/dL (ref 3.5–5.0)
Alkaline Phosphatase: 95 U/L (ref 38–126)
Anion gap: 12 (ref 5–15)
BUN: 12 mg/dL (ref 6–20)
CHLORIDE: 98 mmol/L — AB (ref 101–111)
CO2: 27 mmol/L (ref 22–32)
CREATININE: 0.68 mg/dL (ref 0.44–1.00)
Calcium: 9.5 mg/dL (ref 8.9–10.3)
GFR calc Af Amer: >60 mL/min (ref >60)
Glucose, Bld: 119 mg/dL — ABNORMAL HIGH (ref 65–99)
POTASSIUM: 3.7 mmol/L (ref 3.5–5.1)
SODIUM: 137 mmol/L (ref 135–145)
Total Bilirubin: 0.5 mg/dL (ref 0.3–1.2)
Total Protein: 7.2 g/dL (ref 6.5–8.1)

## 2017-12-08 LAB — DIFFERENTIAL
BASOS ABS: 0 10*3/uL (ref 0.0–0.1)
Basophils Relative: 0 %
EOS ABS: 0.1 10*3/uL (ref 0.0–0.7)
Eosinophils Relative: 2 %
LYMPHS PCT: 35 %
Lymphs Abs: 2.6 10*3/uL (ref 0.7–4.0)
MONO ABS: 0.3 10*3/uL (ref 0.1–1.0)
Monocytes Relative: 4 %
NEUTROS PCT: 59 %
Neutro Abs: 4.3 10*3/uL (ref 1.7–7.7)

## 2017-12-08 LAB — CBC
HCT: 37.4 % (ref 36.0–46.0)
Hemoglobin: 12 g/dL (ref 12.0–15.0)
MCH: 28.2 pg (ref 26.0–34.0)
MCHC: 32.1 g/dL (ref 30.0–36.0)
MCV: 88 fL (ref 78.0–100.0)
PLATELETS: 343 10*3/uL (ref 150–400)
RBC: 4.25 MIL/uL (ref 3.87–5.11)
RDW: 15.1 % (ref 11.5–15.5)
WBC: 7.3 10*3/uL (ref 4.0–10.5)

## 2017-12-08 LAB — APTT: APTT: 28 s (ref 24–36)

## 2017-12-08 MED ORDER — MECLIZINE HCL 25 MG PO TABS: 25.0000 mg | ORAL_TABLET | Freq: Three times a day (TID) | ORAL | 0 refills | Status: AC | PRN

## 2017-12-08 MED ORDER — MECLIZINE HCL 25 MG PO TABS
25.0000 mg | ORAL_TABLET | Freq: Once | ORAL | Status: AC
  Administered 2017-12-08: 25 mg via ORAL
  Filled 2017-12-08: qty 1

## 2017-12-08 NOTE — ED Notes (Addendum)
Pt ambulated to restroom. Gait steady.  

## 2017-12-08 NOTE — ED Provider Notes (Signed)
[place] [facility] EMERGENCY DEPARTMENT Provider Note   CSN: [id number] Arrival date & time: 12/08/17  8850     History   Chief Complaint Chief Complaint  Patient presents with  . Dizziness    HPI Alicia Martinez is a 62 y.o. female.  Patient with the onset of dizziness and room spinning last evening.  Patient states she is never had anything like this before.  She states it feels like there is constantly spinning.  Not associated with any nausea or vomiting.  No headache.  No weakness to the arms or legs no speech problems.  Patient is on the blood thinner Xarelto for past history of pulmonary embolus and deep vein thrombosis.  Patient without a history of arrhythmias.  Patient does give a history of some intermittent nosebleeds but no active bleeding now.      Past Medical History:  Diagnosis Date  . Allergy   . Anemia   . Arthritis    DJD, ARTHRITIS  . Cancer ([place])    rectal Ca  . Carotid artery plaque 08-30-13   PER CAROTID DOPPLER STUDY  . Deep vein thrombosis (HCC)    left lower extremity  . Diverticulitis large intestine   . Heart palpitations    HX OF PALPITATIONS AND SOME DIZZINESS - EVALUATED BY CARDIOLOGIST DR. ROSS - OFFICE NOTES IS EPIC 08/24/13 - "EVENT MONITOR SHOWED NO SIGNIFICANT ARRHYTHMIA"  . History of rectal cancer    T1N0 resected 08/2009  . Hypertension   . Infectious colitis   . Obesity   . Pain    LOWER BACK AND RT KNEE AND BOTH SHOULDERS - LEFT SHOULDER PAIN WORSE--PT TOLD ARTHRITIS  . Pulmonary embolism ([facility])    LAST ONE WAS 1995- ON CHRONIC COUMADIN  . Sleep apnea 08/09/2012   wears CPAP    Patient Active Problem List   Diagnosis Date Noted  . Diabetes mellitus type 2, diet-controlled ([place]) 08/09/2017  . Onychomycosis of toenail 12/30/2015  . Left hip pain 11/03/2015  . Multiple thyroid nodules 11/03/2015  . Diverticulosis 10/30/2015  . 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 Osteoarthritis of multiple joints 12/31/2013  . Health care maintenance 10/31/2012  . Obstructive sleep apnea on CPAP 08/09/2012  . Adnexal mass 01/18/2011  . Long term current use of anticoagulants due to h/o PE and recurrent DVT with INR goal of 2.0-3.0 10/17/2010  . Hematuria 12/01/2009  . ADENOCARCINOMA, RECTUM 08/06/2009  . PERS HX MAL NEOPLSM RECT RECTOSIGMOID JUNC&ANUS 08/06/2009  . Obesity, Class [name], BMI 40-49.9 (morbid obesity) ([place]) 02/27/2009  . History of tobacco use disorder 02/27/2009  . Essential hypertension 02/27/2009    Past Surgical History:  Procedure Laterality Date  . ABDOMINAL HYSTERECTOMY    . APPENDECTOMY    . CECOSTOMY N/A 03/15/2014   Procedure: partial CECECTOMY;  Surgeon: Adin Hector, MD;  Location: [facility];  Service: General;  Laterality: N/A;  . COLONOSCOPY    . LAPAROSCOPIC APPENDECTOMY N/A 03/15/2014   Procedure: APPENDECTOMY LAPAROSCOPIC;  Surgeon: Adin Hector, MD;  Location: [facility];  Service: General;  Laterality: N/A;  . LAPAROSCOPIC LYSIS OF ADHESIONS N/A 03/15/2014   Procedure: LAPAROSCOPIC LYSIS OF ADHESIONS;  Surgeon: Adin Hector, MD;  Location: [facility];  Service: General;  Laterality: N/A;  . TRANSANAL RECTAL RESECTION  08/2009   T1N0 rectal cancer Dr. Johney Maine  . TUBAL LIGATION      OB History    No data available  Home Medications    Prior to Admission medications   Medication Sig Start Date End Date Taking? Authorizing Provider  capsicum (MUSCLE RELIEF) 0.075 % topical cream Apply 1 application topically 2 (two) times daily as needed (muscle pain).    [provider]  diphenhydrAMINE (SOMINEX) 25 MG tablet Take 25 mg by mouth at bedtime as needed for sleep.    [provider]  hydrochlorothiazide (HYDRODIURIL) 25 MG tablet Take 1 tablet (25 mg total) by mouth daily. 04/18/17   Tawny Asal, MD  meclizine (ANTIVERT) 25 MG tablet Take 1 tablet (25 mg total) by mouth 3 (three) times daily as needed for  dizziness. 12/08/17   Fredia Sorrow, MD  oxyCODONE (OXY IR/ROXICODONE) 5 MG immediate release tablet Take 1 tablet by mouth 3 (three) times daily as needed. 04/05/17   [provider]  Psyllium (METAMUCIL PO) Take 1 packet by mouth daily.     [provider]  rivaroxaban (XARELTO) 20 MG TABS tablet TAKE 1 TABLET(20 MG) BY MOUTH DAILY WITH DINNER 05/31/17   Tawny Asal, MD  tetrahydrozoline 0.05 % ophthalmic solution Place 1 drop into both eyes as needed (allergies/red eyes). [place]    [provider]    Family History Family History  Problem Relation Age of Onset  . Cancer Mother        unknown type but patient thinks it was bone marrow cancer  . Heart disease Sister   . Heart disease Brother   . Clotting disorder Brother   . Colon cancer Neg Hx   . Stomach cancer Neg Hx   . Rectal cancer Neg Hx   . Esophageal cancer Neg Hx     Social History Social History   Tobacco Use  . Smoking status: Current Some Day Smoker    Packs/day: 0.30    Years: 40.00    Pack years: 12.00    Types: Cigarettes  . Smokeless tobacco: Never Used  Substance Use Topics  . Alcohol use: No    Alcohol/week: 0.0 oz  . Drug use: No     Allergies   Other   Review of Systems Review of Systems  Constitutional: Negative for fever.  HENT: Positive for nosebleeds. Negative for congestion, ear pain, hearing loss and tinnitus.   Eyes: Negative for visual disturbance.  Respiratory: Negative for shortness of breath.   Cardiovascular: Negative for chest pain.  Gastrointestinal: Negative for abdominal pain, nausea and vomiting.  Genitourinary: Negative for hematuria.  Musculoskeletal: Negative for back pain.  Neurological: Positive for dizziness. Negative for speech difficulty, weakness, numbness and headaches.  Hematological: Bruises/bleeds easily.  Psychiatric/Behavioral: Negative for confusion.     Physical Exam Updated Vital Signs BP (!) 120/58   Pulse 79   Temp 98.8  F (37.1 C)   Resp 13   Ht 1.676 m (5\' 6" )   Wt 117.9 kg (260 lb)   SpO2 98%   BMI 41.97 kg/m   Physical Exam  Constitutional: She is oriented to person, place, and time. She appears well-developed and well-nourished. No distress.  HENT:  Head: Normocephalic and atraumatic.  Mouth/Throat: Oropharynx is clear and moist.  Eyes: Conjunctivae and EOM are normal. Pupils are equal, round, and reactive to light.  Neck: Neck supple.  Cardiovascular: Normal rate, regular rhythm and normal heart sounds.  Pulmonary/Chest: Effort normal and breath sounds normal.  Abdominal: Soft. Bowel sounds are normal.  Musculoskeletal: Normal range of motion.  Neurological: She is alert and oriented to person, place, and time. No  cranial nerve deficit or sensory deficit. She exhibits normal muscle tone. Coordination normal.  Could reproduce room spinning with movement of head.  Skin: Skin is warm.  Nursing note and vitals reviewed.    ED Treatments / Results  Labs (all labs ordered are listed, but only abnormal results are displayed) Labs Reviewed  PROTIME-INR - Abnormal; Notable for the following components:      Result Value   Prothrombin Time 16.2 (*)    All other components within normal limits  COMPREHENSIVE METABOLIC PANEL - Abnormal; Notable for the following components:   Chloride 98 (*)    Glucose, Bld 119 (*)    All other components within normal limits  APTT  CBC  DIFFERENTIAL  I-STAT TROPONIN, ED    EKG  EKG Interpretation  Date/Time:  Thursday December 08 2017 08:42:54 EDT Ventricular Rate:  76 PR Interval:  178 QRS Duration: 98 QT Interval:  376 QTC Calculation: 423 R Axis:   20 Text Interpretation:  Normal sinus rhythm Cannot rule out Anterior infarct , age undetermined Abnormal ECG Confirmed by Fredia Sorrow [phone]) on 12/08/2017 1:06:32 PM       Radiology Ct Head Wo Contrast  Result Date: 12/08/2017 CLINICAL DATA:  Dizziness and lethargy for 3 days.  No injury  EXAM: CT HEAD WITHOUT CONTRAST TECHNIQUE: Contiguous axial images were obtained from the base of the skull through the vertex without intravenous contrast. COMPARISON:  None. FINDINGS: Brain: No evidence of acute infarction, hemorrhage, hydrocephalus, extra-axial collection or mass lesion/mass effect. Vascular: No hyperdense vessel or unexpected calcification. Skull: Normal. Negative for fracture or focal lesion. Sinuses/Orbits: Normal globes and orbits. Mild mucosal thickening lines the ethmoid air cells, left greater than right and extends into the left frontal sinus. Remaining visualized sinuses are clear. Clear mastoid air cells. Other: None. IMPRESSION: 1. No intracranial abnormality. 2. Mild sinus mucosal thickening as described. Electronically Signed   By: Lajean Manes M.D.   On: 12/08/2017 09:35   Mr Brain Wo Contrast (neuro Protocol)  Result Date: 12/08/2017 CLINICAL DATA:  62 y/o  F; constant dizziness.  Daily nose bleeds. EXAM: MRI HEAD WITHOUT CONTRAST TECHNIQUE: Multiplanar, multiecho pulse sequences of the brain and surrounding structures were obtained without intravenous contrast. COMPARISON:  12/08/2017 CT head. FINDINGS: Brain: No acute infarction, hemorrhage, hydrocephalus, extra-axial collection or mass lesion. Empty sella turcica and optic nerve sheath enlargement. Vascular: Normal flow voids. Skull and upper cervical spine: Normal marrow signal. Sinuses/Orbits: Moderate diffuse paranasal sinus disease greatest in left frontal and ethmoid air cells. No abnormal signal of mastoid air cells. Orbits are unremarkable. Other: None. IMPRESSION: 1. No acute intracranial abnormality identified. 2. Partially empty sella turcica and optic nerve sheath enlargement, findings which can be seen with idiopathic intracranial hypertension in the appropriate clinical setting. 3. Moderate paranasal sinus disease. Electronically Signed   By: Kristine Garbe M.D.   On: 12/08/2017 17:00     Procedures Procedures (including critical care time)  Medications Ordered in ED Medications  meclizine (ANTIVERT) tablet 25 mg (25 mg Oral Given 12/08/17 1414)     Initial Impression / Assessment and Plan / ED Course  I have reviewed the triage vital signs and the nursing notes.  Pertinent labs & imaging results that were available during my care of the patient were reviewed by me and considered in my medical decision making (see chart for details).    Patient workup was more for vertigo with concern for neoplastic process or even stroke.  MRI was negative.  Patient treated with Antivert.  Said it did not help any but patient was able to ambulate stand and walk but seem to be much improved from when she first arrived.  The MRI did show some changes around the pituitary gland patient was referred to our neurology for further follow-up of the vertigo.  She will be given a prescription of Antivert to take at home.  Patient also will follow up with her regular doctor.   Final Clinical Impressions(s) / ED Diagnoses   Final diagnoses:  Vertigo    ED Discharge Orders        Ordered    meclizine (ANTIVERT) 25 MG tablet  3 times daily PRN     12/08/17 1753       Fredia Sorrow, MD 12/08/17 1820

## 2017-12-08 NOTE — Discharge Instructions (Signed)
Continue take the Antivert medication.  Make an appointment to follow-up with neurology.  Also make an appointment follow-up with your regular doctor.  Today's workup to include MRI without any acute evidence of stroke or tumor.  There was some abnormalities around the pituitary probably not related to the dizziness but important for neurology to follow that up.  Return for any new or worse symptoms.

## 2017-12-08 NOTE — ED Notes (Signed)
Pt refused discharge vitals. Pt verbalized understanding of discharge instructions.

## 2017-12-08 NOTE — ED Triage Notes (Signed)
Patient complains of constant dizziness that started yesterday evening, states "it feels like the room is constantly spinning". Also complains of daily nosebleeds as many as three everyday for the last two weeks, patient is anticoagulated. No other neuro deficit, patient in no distress at this time.

## 2017-12-08 NOTE — ED Notes (Signed)
Patient in room now.

## 2017-12-08 NOTE — ED Notes (Signed)
Patient transported to MRI 

## 2017-12-09 ENCOUNTER — Encounter: Payer: Self-pay | Admitting: Neurology

## 2018-02-10 IMAGING — US US SOFT TISSUE HEAD/NECK
1 series · 13 of 25 positions shown · non-contrast
Comparison: 11/20/2014

CLINICAL DATA: 59-year-old female with a history of multiple
thyroid nodules

EXAM:
THYROID ULTRASOUND
TECHNIQUE: Ultrasound examination of the thyroid gland and adjacent soft
tissues was performed.

[Series 1: us soft tissue head/neck · 0.07mm/px · 13 of 55 slices shown]
[im 1/55]
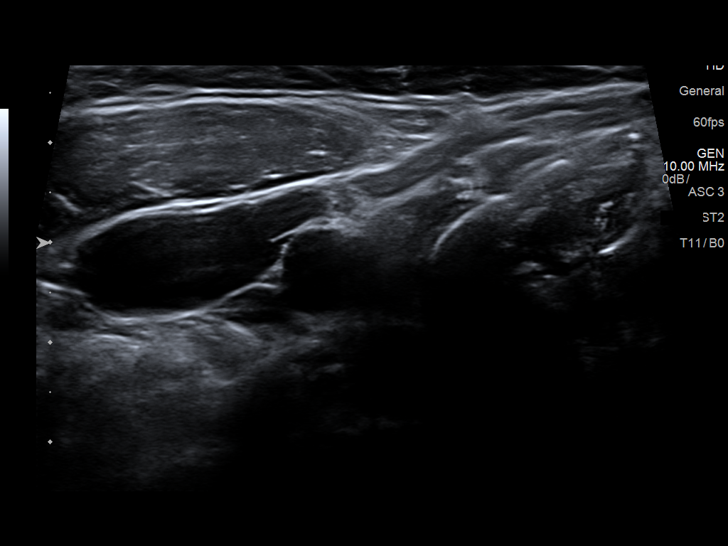
[im 5/55]
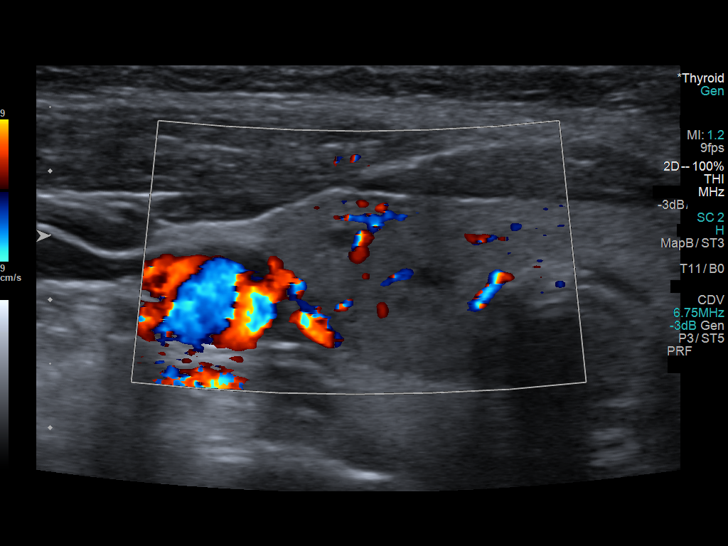
[im 10/55]
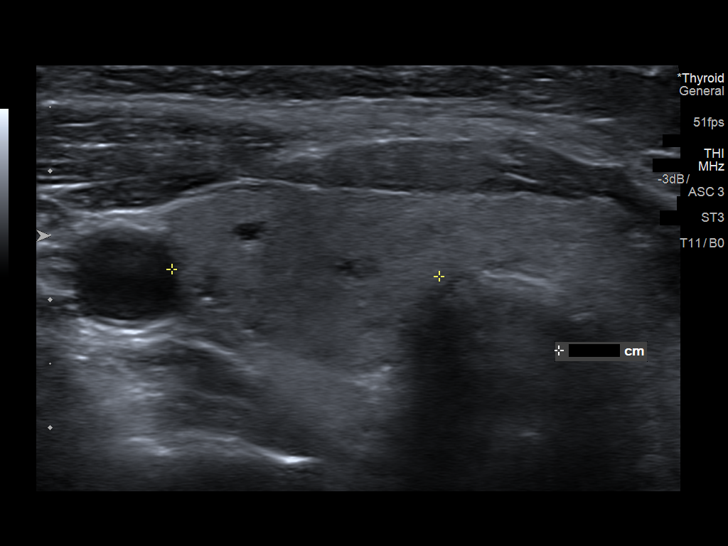
[im 14/55]
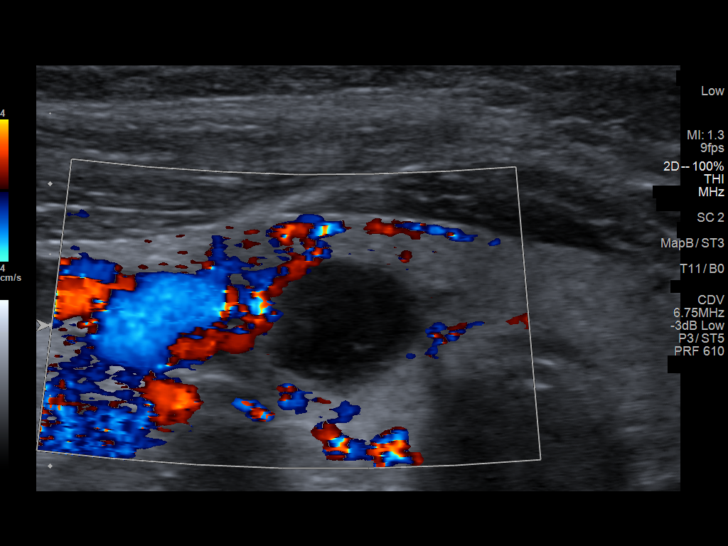
[im 19/55]
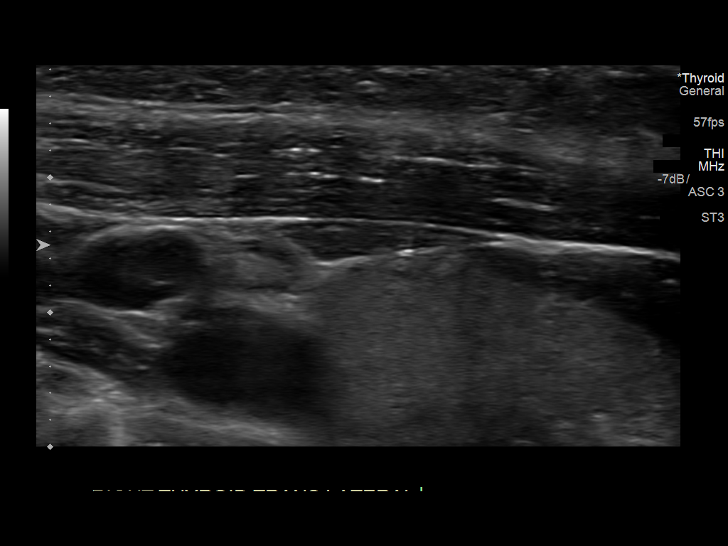
[im 23/55]
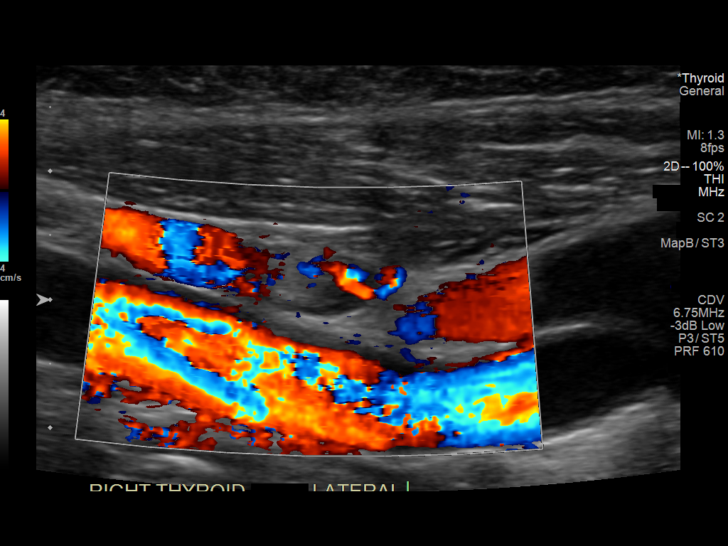
[im 28/55]
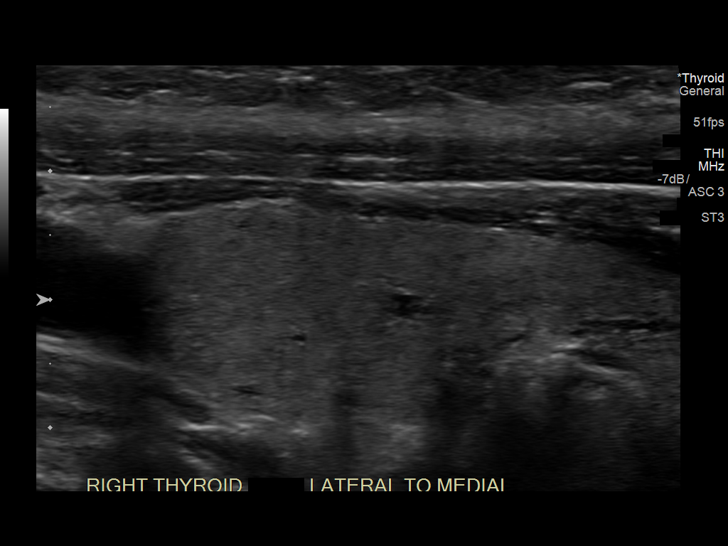
[im 32/55]
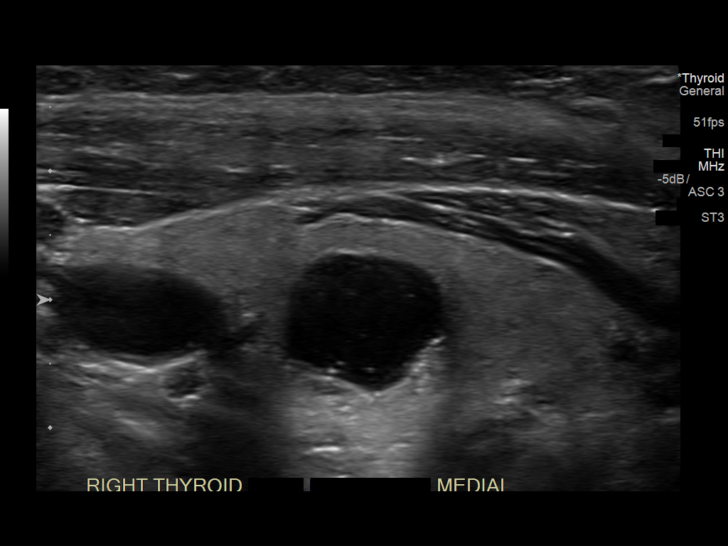
[im 37/55]
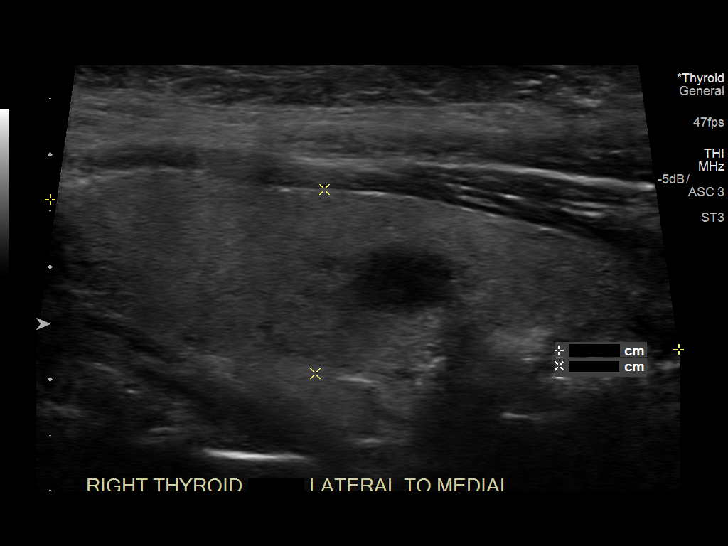
[im 41/55]
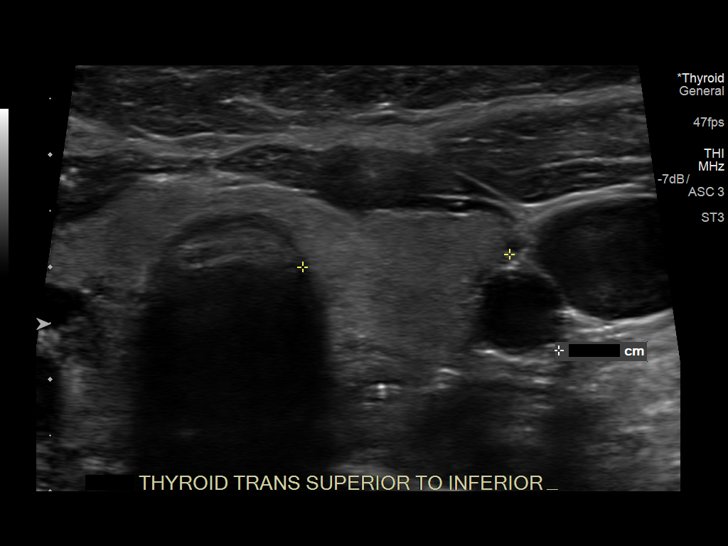
[im 46/55]
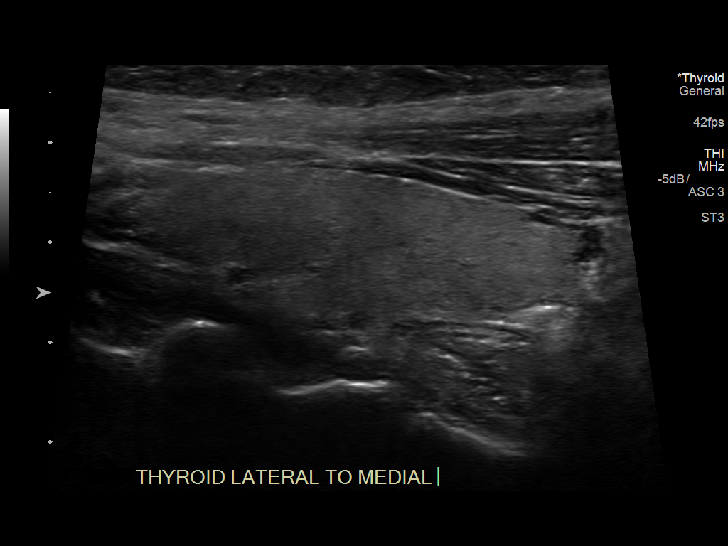
[im 50/55]
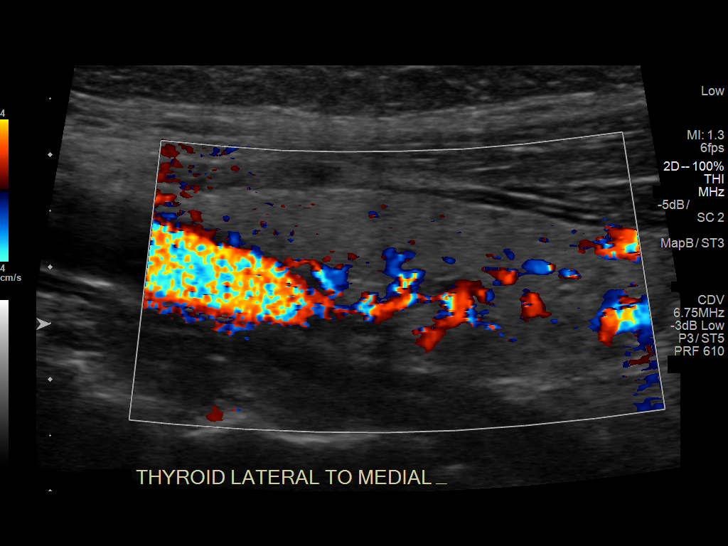
[im 55/55]
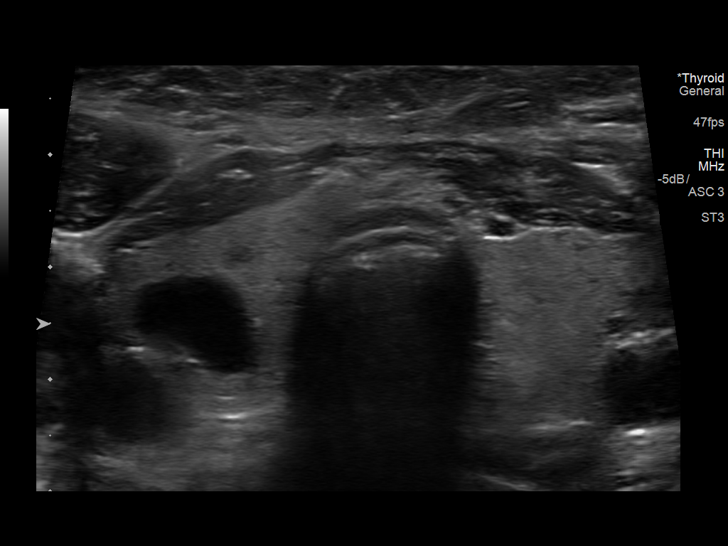

[13 of 25 positions shown; findings below may reference images not displayed]

FINDINGS: Right thyroid lobe

Measurements: 5.8 cm x 1.6 cm x 2.1 cm. Nodules at the superior
right thyroid measure no more than 3 mm.

Dominant nodule on the right measures 1.4 cm x 1.1 cm x 1.2 cm,
inferior right thyroid. Posterior acoustic enhancement, with
internal colloid.

Left thyroid lobe

Measurements: 4.8 cm x 1.6 cm x 1.9 cm.  No nodules visualized.

Isthmus

Thickness: 6 mm.  No nodules visualized.

Lymphadenopathy

None visualized.
IMPRESSION: Multinodular thyroid, with unchanged appearance of the dominant
right inferior thyroid nodule.

Findings do not meet current SRU consensus criteria for biopsy.
Follow-up by clinical exam is recommended. If patient has known risk
factors for thyroid carcinoma, consider follow-up ultrasound in 12
months. If patient is clinically hyperthyroid, consider nuclear
medicine thyroid uptake and scan.

Reference: Management of Thyroid Nodules Detected at US: Society of
Radiologists in Ultrasound Consensus Conference Statement. Radiology
5117; [DATE].

## 2018-02-17 ENCOUNTER — Ambulatory Visit: Payer: 59 | Admitting: Neurology

## 2018-03-09 ENCOUNTER — Other Ambulatory Visit: Payer: Self-pay | Admitting: Internal Medicine

## 2018-03-13 ENCOUNTER — Emergency Department (HOSPITAL_COMMUNITY)
Admission: EM | Admit: 2018-03-13 | Discharge: 2018-03-13 | Disposition: A | Discharge status: Home / Self Care | Payer: 59 | Attending: Emergency Medicine | Admitting: Emergency Medicine

## 2018-03-13 ENCOUNTER — Emergency Department (HOSPITAL_COMMUNITY): Payer: 59

## 2018-03-13 ENCOUNTER — Encounter (HOSPITAL_COMMUNITY): Payer: Self-pay | Admitting: Emergency Medicine

## 2018-03-13 DIAGNOSIS — Z79899 Other long term (current) drug therapy: Secondary | ICD-10-CM | POA: Insufficient documentation

## 2018-03-13 DIAGNOSIS — F17210 Nicotine dependence, cigarettes, uncomplicated: Secondary | ICD-10-CM | POA: Diagnosis not present

## 2018-03-13 DIAGNOSIS — I10 Essential (primary) hypertension: Secondary | ICD-10-CM | POA: Insufficient documentation

## 2018-03-13 DIAGNOSIS — E876 Hypokalemia: Secondary | ICD-10-CM

## 2018-03-13 DIAGNOSIS — R1010 Upper abdominal pain, unspecified: Secondary | ICD-10-CM | POA: Diagnosis present

## 2018-03-13 DIAGNOSIS — E119 Type 2 diabetes mellitus without complications: Secondary | ICD-10-CM | POA: Insufficient documentation

## 2018-03-13 DIAGNOSIS — R109 Unspecified abdominal pain: Secondary | ICD-10-CM

## 2018-03-13 DIAGNOSIS — R6883 Chills (without fever): Secondary | ICD-10-CM

## 2018-03-13 LAB — URINALYSIS, ROUTINE W REFLEX MICROSCOPIC
Bilirubin Urine: NEGATIVE
GLUCOSE, UA: NEGATIVE mg/dL
KETONES UR: NEGATIVE mg/dL
Leukocytes, UA: NEGATIVE
NITRITE: NEGATIVE
PROTEIN: NEGATIVE mg/dL
Specific Gravity, Urine: 1.011 (ref 1.005–1.030)
pH: 5 (ref 5.0–8.0)

## 2018-03-13 LAB — CBC
HCT: 38.5 % (ref 36.0–46.0)
HEMOGLOBIN: 12.6 g/dL (ref 12.0–15.0)
MCH: 28.9 pg (ref 26.0–34.0)
MCHC: 32.7 g/dL (ref 30.0–36.0)
MCV: 88.3 fL (ref 78.0–100.0)
Platelets: 285 10*3/uL (ref 150–400)
RBC: 4.36 MIL/uL (ref 3.87–5.11)
RDW: 14.1 % (ref 11.5–15.5)
WBC: 9.3 10*3/uL (ref 4.0–10.5)

## 2018-03-13 LAB — COMPREHENSIVE METABOLIC PANEL
ALK PHOS: 121 U/L (ref 38–126)
ALT: 21 U/L (ref 14–54)
ANION GAP: 11 (ref 5–15)
AST: 24 U/L (ref 15–41)
Albumin: 4 g/dL (ref 3.5–5.0)
BUN: 19 mg/dL (ref 6–20)
CALCIUM: 9.6 mg/dL (ref 8.9–10.3)
CO2: 33 mmol/L — AB (ref 22–32)
Chloride: 96 mmol/L — ABNORMAL LOW (ref 101–111)
Creatinine, Ser: 0.75 mg/dL (ref 0.44–1.00)
GFR calc Af Amer: >60 mL/min (ref >60)
GFR calc non Af Amer: >60 mL/min (ref >60)
Glucose, Bld: 158 mg/dL — ABNORMAL HIGH (ref 65–99)
POTASSIUM: 2.8 mmol/L — AB (ref 3.5–5.1)
SODIUM: 140 mmol/L (ref 135–145)
Total Bilirubin: 0.8 mg/dL (ref 0.3–1.2)
Total Protein: 7.7 g/dL (ref 6.5–8.1)

## 2018-03-13 LAB — LIPASE, BLOOD: Lipase: 17 U/L (ref 11–51)

## 2018-03-13 MED ORDER — POTASSIUM CHLORIDE ER 10 MEQ PO TBCR: 10.0000 meq | EXTENDED_RELEASE_TABLET | Freq: Two times a day (BID) | ORAL | 0 refills | Status: AC

## 2018-03-13 MED ORDER — SODIUM CHLORIDE 0.9 % IV BOLUS
1000.0000 mL | Freq: Once | INTRAVENOUS | Status: AC
Start: 2018-03-13 — End: 2018-03-13
  Administered 2018-03-13: 1000 mL via INTRAVENOUS

## 2018-03-13 MED ORDER — IOPAMIDOL (ISOVUE-300) INJECTION 61%
INTRAVENOUS | Status: AC
  Filled 2018-03-13: qty 100

## 2018-03-13 MED ORDER — MORPHINE SULFATE (PF) 4 MG/ML IV SOLN
4.0000 mg | Freq: Once | INTRAVENOUS | Status: AC
  Administered 2018-03-13: 4 mg via INTRAVENOUS
  Filled 2018-03-13: qty 1

## 2018-03-13 MED ORDER — IOPAMIDOL (ISOVUE-300) INJECTION 61%
100.0000 mL | Freq: Once | INTRAVENOUS | Status: AC | PRN
  Administered 2018-03-13: 100 mL via INTRAVENOUS

## 2018-03-13 NOTE — ED Provider Notes (Signed)
[facility] DEPT Provider Note   CSN: [id number] Arrival date & time: 03/13/18  1126     History   Chief Complaint Chief Complaint  Patient presents with  . Abdominal Pain  . Shaking  . Chills    HPI ANIA LEVAY is a 62 y.o. female.  HPI Patient is a 62 year old female presents the emergency department with generalized abdominal discomfort and chills today.  She states she felt like she was shaking all over.  She denies nausea and vomiting diarrhea.  No dysuria or urinary frequency.  No lower abdominal pain.  No chest pain.  Reported transient shortness of breath but none now.  She has a history of pulmonary embolism but is currently on anticoagulation.  She is starting to feel better at this time already.  No documented fevers.  No new rash.  No neck pain or neck stiffness.  No headache.   Past Medical History:  Diagnosis Date  . Allergy   . Anemia   . Arthritis    DJD, ARTHRITIS  . Cancer ([place])    rectal Ca  . Carotid artery plaque 08-30-13   PER CAROTID DOPPLER STUDY  . Deep vein thrombosis (HCC)    left lower extremity  . Diverticulitis large intestine   . Heart palpitations    HX OF PALPITATIONS AND SOME DIZZINESS - EVALUATED BY CARDIOLOGIST DR. ROSS - OFFICE NOTES IS EPIC 08/24/13 - "EVENT MONITOR SHOWED NO SIGNIFICANT ARRHYTHMIA"  . History of rectal cancer    T1N0 resected 08/2009  . Hypertension   . Infectious colitis   . Obesity   . Pain    LOWER BACK AND RT KNEE AND BOTH SHOULDERS - LEFT SHOULDER PAIN WORSE--PT TOLD ARTHRITIS  . Pulmonary embolism (HCC)    LAST ONE WAS 1995- ON CHRONIC COUMADIN  . Sleep apnea 08/09/2012   wears CPAP    Patient Active Problem List   Diagnosis Date Noted  . Diabetes mellitus type 2, diet-controlled ([place]) 08/09/2017  . Onychomycosis of toenail 12/30/2015  . Left hip pain 11/03/2015  . Multiple thyroid nodules 11/03/2015  . Diverticulosis 10/30/2015  . 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 Osteoarthritis of multiple joints 12/31/2013  . Health care maintenance 10/31/2012  . Obstructive sleep apnea on CPAP 08/09/2012  . Adnexal mass 01/18/2011  . Long term current use of anticoagulants due to h/o PE and recurrent DVT with INR goal of 2.0-3.0 10/17/2010  . Hematuria 12/01/2009  . ADENOCARCINOMA, RECTUM 08/06/2009  . PERS HX MAL NEOPLSM RECT RECTOSIGMOID JUNC&ANUS 08/06/2009  . Obesity, Class [name], BMI 40-49.9 (morbid obesity) ([place]) 02/27/2009  . History of tobacco use disorder 02/27/2009  . Essential hypertension 02/27/2009    Past Surgical History:  Procedure Laterality Date  . ABDOMINAL HYSTERECTOMY    . APPENDECTOMY    . CECOSTOMY N/A 03/15/2014   Procedure: partial CECECTOMY;  Surgeon: Adin Hector, MD;  Location: [facility];  Service: General;  Laterality: N/A;  . COLONOSCOPY    . LAPAROSCOPIC APPENDECTOMY N/A 03/15/2014   Procedure: APPENDECTOMY LAPAROSCOPIC;  Surgeon: Adin Hector, MD;  Location: [facility];  Service: General;  Laterality: N/A;  . LAPAROSCOPIC LYSIS OF ADHESIONS N/A 03/15/2014   Procedure: LAPAROSCOPIC LYSIS OF ADHESIONS;  Surgeon: Adin Hector, MD;  Location: [facility];  Service: General;  Laterality: N/A;  . TRANSANAL RECTAL RESECTION  08/2009   T1N0 rectal cancer Dr. Johney Maine  . TUBAL LIGATION  OB History   None      Home Medications    Prior to Admission medications   Medication Sig Start Date End Date Taking? Authorizing Provider  capsicum (MUSCLE RELIEF) 0.075 % topical cream Apply 1 application topically 2 (two) times daily as needed (muscle pain).   Yes [provider]  diphenhydrAMINE (SOMINEX) 25 MG tablet Take 25 mg by mouth at bedtime as needed for sleep.   Yes [provider]  furosemide (LASIX) 20 MG tablet Take 20 mg by mouth daily. 02/17/18  Yes [provider]  hydrochlorothiazide (HYDRODIURIL) 25 MG tablet Take 1 tablet (25 mg total) by  mouth daily. 04/18/17  Yes Tawny Asal, MD  meclizine (ANTIVERT) 25 MG tablet Take 1 tablet (25 mg total) by mouth 3 (three) times daily as needed for dizziness. 12/08/17  Yes Fredia Sorrow, MD  Oxycodone HCl 10 MG TABS Take 10 mg by mouth every 8 (eight) hours as needed for pain. 03/09/18  Yes [provider]  Psyllium (METAMUCIL PO) Take 1 packet by mouth daily.    Yes [provider]  tetrahydrozoline 0.05 % ophthalmic solution Place 1 drop into both eyes as needed (allergies/red eyes). [facility]   Yes [provider]  XARELTO 20 MG TABS tablet TAKE 1 TABLET(20 MG) BY MOUTH DAILY WITH DINNER 03/11/18  Yes Tawny Asal, MD    Family History Family History  Problem Relation Age of Onset  . Cancer Mother        unknown type but patient thinks it was bone marrow cancer  . Heart disease Sister   . Heart disease Brother   . Clotting disorder Brother   . Colon cancer Neg Hx   . Stomach cancer Neg Hx   . Rectal cancer Neg Hx   . Esophageal cancer Neg Hx     Social History Social History   Tobacco Use  . Smoking status: Current Some Day Smoker    Packs/day: 0.30    Years: 40.00    Pack years: 12.00    Types: Cigarettes  . Smokeless tobacco: Never Used  Substance Use Topics  . Alcohol use: No    Alcohol/week: 0.0 oz  . Drug use: No     Allergies   Other   Review of Systems Review of Systems  All other systems reviewed and are negative.    Physical Exam Updated Vital Signs BP 126/67 (BP Location: Left Arm)   Pulse (!) 109   Temp 98.9 F (37.2 C) (Oral)   Resp (!) 22   Ht 5\' 6"  (1.676 m)   Wt 117.9 kg (260 lb)   SpO2 98%   BMI 41.97 kg/m   Physical Exam  Constitutional: She is oriented to person, place, and time. She appears well-developed and well-nourished. No distress.  HENT:  Head: Normocephalic and atraumatic.  Eyes: EOM are normal.  Neck: Normal range of motion.  Cardiovascular: Normal rate, regular rhythm and normal heart  sounds.  Pulmonary/Chest: Effort normal and breath sounds normal.  Abdominal: Soft. She exhibits no distension. There is no tenderness.  Musculoskeletal: Normal range of motion.  Neurological: She is alert and oriented to person, place, and time.  Skin: Skin is warm and dry.  Psychiatric: She has a normal mood and affect. Judgment normal.  Nursing note and vitals reviewed.    ED Treatments / Results  Labs (all labs ordered are listed, but only abnormal results are displayed) Labs Reviewed  COMPREHENSIVE METABOLIC PANEL - Abnormal; Notable for the  following components:      Result Value   Potassium 2.8 (*)    Chloride 96 (*)    CO2 33 (*)    Glucose, Bld 158 (*)    All other components within normal limits  URINALYSIS, ROUTINE W REFLEX MICROSCOPIC - Abnormal; Notable for the following components:   Hgb urine dipstick SMALL (*)    Bacteria, UA MANY (*)    All other components within normal limits  LIPASE, BLOOD  CBC    EKG None  Radiology Ct Abdomen Pelvis W Contrast  Result Date: 03/13/2018 CLINICAL DATA:  Upper abdominal pain with fever. History of colon cancer. EXAM: CT ABDOMEN AND PELVIS WITH CONTRAST TECHNIQUE: Multidetector CT imaging of the abdomen and pelvis was performed using the standard protocol following bolus administration of intravenous contrast. CONTRAST:  175mL ISOVUE-300 IOPAMIDOL (ISOVUE-300) INJECTION 61% COMPARISON:  PET-CT 01/26/2011.  CT abdomen and pelvis 01/16/2011. FINDINGS: Lower chest: 4 mm subpleural nodule in the right middle lobe, not included on the prior abdominal CT though likely unchanged from the PET-CT allowing for motion artifact (series 6, image 1). 2 mm and 3 mm right middle lobe nodules were not well demonstrated on the prior abdominal CT though appear to have been present on an older abdominal CT from 10/16/2009 and are most likely benign. Minimal scarring is noted in both lung bases. There is no pleural effusion. Hepatobiliary: Hepatic  steatosis. Mild enlargement of an inferior right hepatic lobe cyst, now 4.7 cm. Partially collapsed gallbladder. No biliary dilatation. Pancreas: Fatty infiltration of the pancreas most notable in the pancreatic head. No ductal dilatation or peripancreatic inflammatory changes. Spleen: Unremarkable. Adrenals/Urinary Tract: Unremarkable adrenal glands. Slight fullness of the renal collecting systems and ureters, left greater than right. No urolithiasis. No renal mass. Unremarkable bladder. Stomach/Bowel: Possible Delice Lesch E sliding hiatal hernia. No bowel obstruction. Prior appendectomy. Soft tissue thickening surrounding the rectosigmoid anastomosis on the prior CT has decreased. Vascular/Lymphatic: Mild abdominal aortic atherosclerosis without aneurysm. Retroaortic left renal vein. No enlarged lymph nodes. Reproductive: Status post hysterectomy. Resolution of the bilateral adnexal masses on the prior CT which were further evaluated by ultrasound in the interim. 1 or 2 small right adnexal cysts are currently present. Other: No intraperitoneal free fluid.  No abdominal wall hernia. Musculoskeletal: Severe lower lumbar facet arthrosis with grade 1 anterolisthesis of L4 on L5 and bilateral neural foraminal stenosis. IMPRESSION: 1. No acute abnormality identified in the abdomen or pelvis. 2. Hepatic steatosis. Mild interval enlargement of a right hepatic cyst. 3.  Aortic Atherosclerosis (ICD10-I70.0). Electronically Signed   By: Logan Bores M.D.   On: 03/13/2018 15:14    Procedures Procedures (including critical care time)  Medications Ordered in ED Medications  sodium chloride 0.9 % bolus 1,000 mL (0 mLs Intravenous Stopped 03/13/18 1617)  morphine 4 MG/ML injection 4 mg (4 mg Intravenous Given 03/13/18 1311)  iopamidol (ISOVUE-300) 61 % injection 100 mL (100 mLs Intravenous Contrast Given 03/13/18 1443)     Initial Impression / Assessment and Plan / ED Course  I have reviewed the triage vital signs and the  nursing notes.  Pertinent labs & imaging results that were available during my care of the patient were reviewed by me and considered in my medical decision making (see chart for details).     Patient feels much better at this time.  CT imaging of her abdomen and pelvis is without acute pathology.  Labs are unremarkable except for mild hypokalemia which will be replaced with a  short course of potassium supplementation.  Primary care follow-up.  Patient understands return to the emergency department for new or worsening symptoms  Final Clinical Impressions(s) / ED Diagnoses   Final diagnoses:  Acute abdominal pain  Chills    ED Discharge Orders    None       Jola Schmidt, MD 03/13/18 1656

## 2018-03-13 NOTE — ED Notes (Signed)
Patient transported to CT 

## 2018-03-13 NOTE — ED Triage Notes (Signed)
Pt reports that today while at work she got upper abd pains with chills, shaking, and SOB.  Pt has Hx blood clots and on Xeralto. Denies n/v/d.

## 2018-03-22 ENCOUNTER — Ambulatory Visit: Payer: 59 | Admitting: Neurology

## 2018-04-10 ENCOUNTER — Encounter (HOSPITAL_COMMUNITY): Payer: Self-pay | Admitting: Emergency Medicine

## 2018-04-10 ENCOUNTER — Other Ambulatory Visit: Payer: Self-pay

## 2018-04-10 ENCOUNTER — Emergency Department (HOSPITAL_COMMUNITY)
Admission: EM | Admit: 2018-04-10 | Discharge: 2018-04-11 | Disposition: A | Discharge status: Home / Self Care | Payer: 59 | Attending: Emergency Medicine | Admitting: Emergency Medicine

## 2018-04-10 DIAGNOSIS — I10 Essential (primary) hypertension: Secondary | ICD-10-CM | POA: Insufficient documentation

## 2018-04-10 DIAGNOSIS — K529 Noninfective gastroenteritis and colitis, unspecified: Secondary | ICD-10-CM | POA: Diagnosis not present

## 2018-04-10 DIAGNOSIS — F17210 Nicotine dependence, cigarettes, uncomplicated: Secondary | ICD-10-CM | POA: Diagnosis not present

## 2018-04-10 DIAGNOSIS — Z7901 Long term (current) use of anticoagulants: Secondary | ICD-10-CM | POA: Diagnosis not present

## 2018-04-10 DIAGNOSIS — R1084 Generalized abdominal pain: Secondary | ICD-10-CM | POA: Diagnosis present

## 2018-04-10 DIAGNOSIS — E119 Type 2 diabetes mellitus without complications: Secondary | ICD-10-CM | POA: Diagnosis not present

## 2018-04-10 DIAGNOSIS — Z79899 Other long term (current) drug therapy: Secondary | ICD-10-CM | POA: Diagnosis not present

## 2018-04-10 LAB — URINALYSIS, ROUTINE W REFLEX MICROSCOPIC
Bilirubin Urine: NEGATIVE
Glucose, UA: NEGATIVE mg/dL
Ketones, ur: NEGATIVE mg/dL
Nitrite: POSITIVE — AB
PROTEIN: NEGATIVE mg/dL
SPECIFIC GRAVITY, URINE: 1.015 (ref 1.005–1.030)
pH: 5 (ref 5.0–8.0)

## 2018-04-10 LAB — COMPREHENSIVE METABOLIC PANEL
ALBUMIN: 3.5 g/dL (ref 3.5–5.0)
ALT: 16 U/L (ref 0–44)
AST: 13 U/L — AB (ref 15–41)
Alkaline Phosphatase: 85 U/L (ref 38–126)
Anion gap: 10 (ref 5–15)
BILIRUBIN TOTAL: 0.6 mg/dL (ref 0.3–1.2)
BUN: 8 mg/dL (ref 6–20)
CHLORIDE: 101 mmol/L (ref 98–111)
CO2: 30 mmol/L (ref 22–32)
CREATININE: 0.61 mg/dL (ref 0.44–1.00)
Calcium: 9 mg/dL (ref 8.9–10.3)
GFR calc Af Amer: >60 mL/min (ref >60)
GLUCOSE: 125 mg/dL — AB (ref 70–99)
POTASSIUM: 3.5 mmol/L (ref 3.5–5.1)
Sodium: 141 mmol/L (ref 135–145)
Total Protein: 7.3 g/dL (ref 6.5–8.1)

## 2018-04-10 LAB — CBC
HEMATOCRIT: 38.5 % (ref 36.0–46.0)
Hemoglobin: 12.4 g/dL (ref 12.0–15.0)
MCH: 28.6 pg (ref 26.0–34.0)
MCHC: 32.2 g/dL (ref 30.0–36.0)
MCV: 88.7 fL (ref 78.0–100.0)
PLATELETS: 256 10*3/uL (ref 150–400)
RBC: 4.34 MIL/uL (ref 3.87–5.11)
RDW: 14.6 % (ref 11.5–15.5)
WBC: 5.5 10*3/uL (ref 4.0–10.5)

## 2018-04-10 LAB — POC URINE PREG, ED: PREG TEST UR: NEGATIVE

## 2018-04-10 LAB — I-STAT BETA HCG BLOOD, ED (MC, WL, AP ONLY): HCG, QUANTITATIVE: 6.3 m[IU]/mL — AB (ref <5)

## 2018-04-10 LAB — LIPASE, BLOOD: Lipase: 18 U/L (ref 11–51)

## 2018-04-10 MED ORDER — SODIUM CHLORIDE 0.9 % IV BOLUS
1000.0000 mL | Freq: Once | INTRAVENOUS | Status: AC
  Administered 2018-04-11: 1000 mL via INTRAVENOUS

## 2018-04-10 MED ORDER — ONDANSETRON HCL 4 MG/2ML IJ SOLN
4.0000 mg | Freq: Once | INTRAMUSCULAR | Status: AC
  Administered 2018-04-11: 4 mg via INTRAVENOUS
  Filled 2018-04-10: qty 2

## 2018-04-10 MED ORDER — KETOROLAC TROMETHAMINE 30 MG/ML IJ SOLN
30.0000 mg | Freq: Once | INTRAMUSCULAR | Status: AC
  Administered 2018-04-11: 30 mg via INTRAVENOUS
  Filled 2018-04-10: qty 1

## 2018-04-10 NOTE — ED Triage Notes (Signed)
Patient c/o diarrhea and generalized abdominal pain x 2 days. Denies N/V. Ambulatory.

## 2018-04-10 NOTE — ED Provider Notes (Signed)
[facility] DEPT Provider Note   CSN: [id number] Arrival date & time: 04/10/18  1811     History   Chief Complaint Chief Complaint  Patient presents with  . Abdominal Pain    HPI Alicia Martinez is a 62 y.o. female.  Patient is a 62 year old female with history of hypertension, colitis, prior DVT.  She presents today for evaluation of abdominal cramping, diarrhea for the past 2 days.  She denies any fevers or chills.  She denies any bloody stool or vomit.  She denies any ill contacts and having consumed any bilateral suspicious foods.  The history is provided by the patient.  Abdominal Pain   This is a new problem. The current episode started 2 days ago. The problem occurs constantly. The problem has been gradually worsening. The pain is associated with eating. The pain is located in the generalized abdominal region. The quality of the pain is cramping. The pain is moderate. Associated symptoms include nausea. Pertinent negatives include fever, hematochezia and vomiting. Nothing aggravates the symptoms. Nothing relieves the symptoms.    Past Medical History:  Diagnosis Date  . Allergy   . Anemia   . Arthritis    DJD, ARTHRITIS  . Cancer ([place])    rectal Ca  . Carotid artery plaque 08-30-13   PER CAROTID DOPPLER STUDY  . Deep vein thrombosis (HCC)    left lower extremity  . Diverticulitis large intestine   . Heart palpitations    HX OF PALPITATIONS AND SOME DIZZINESS - EVALUATED BY CARDIOLOGIST DR. ROSS - OFFICE NOTES IS EPIC 08/24/13 - "EVENT MONITOR SHOWED NO SIGNIFICANT ARRHYTHMIA"  . History of rectal cancer    T1N0 resected 08/2009  . Hypertension   . Infectious colitis   . Obesity   . Pain    LOWER BACK AND RT KNEE AND BOTH SHOULDERS - LEFT SHOULDER PAIN WORSE--PT TOLD ARTHRITIS  . Pulmonary embolism (HCC)    LAST ONE WAS 1995- ON CHRONIC COUMADIN  . Sleep apnea 08/09/2012   wears CPAP    Patient Active Problem List   Diagnosis Date Noted  . Diabetes mellitus type 2, diet-controlled ([place]) 08/09/2017  . Onychomycosis of toenail 12/30/2015  . Left hip pain 11/03/2015  . Multiple thyroid nodules 11/03/2015  . Diverticulosis 10/30/2015  . 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 Osteoarthritis of multiple joints 12/31/2013  . Health care maintenance 10/31/2012  . Obstructive sleep apnea on CPAP 08/09/2012  . Adnexal mass 01/18/2011  . Long term current use of anticoagulants due to h/o PE and recurrent DVT with INR goal of 2.0-3.0 10/17/2010  . Hematuria 12/01/2009  . ADENOCARCINOMA, RECTUM 08/06/2009  . PERS HX MAL NEOPLSM RECT RECTOSIGMOID JUNC&ANUS 08/06/2009  . Obesity, Class [name], BMI 40-49.9 (morbid obesity) ([place]) 02/27/2009  . History of tobacco use disorder 02/27/2009  . Essential hypertension 02/27/2009    Past Surgical History:  Procedure Laterality Date  . ABDOMINAL HYSTERECTOMY    . APPENDECTOMY    . CECOSTOMY N/A 03/15/2014   Procedure: partial CECECTOMY;  Surgeon: Adin Hector, MD;  Location: [facility];  Service: General;  Laterality: N/A;  . COLONOSCOPY    . LAPAROSCOPIC APPENDECTOMY N/A 03/15/2014   Procedure: APPENDECTOMY LAPAROSCOPIC;  Surgeon: Adin Hector, MD;  Location: [facility];  Service: General;  Laterality: N/A;  . LAPAROSCOPIC LYSIS OF ADHESIONS N/A 03/15/2014   Procedure: LAPAROSCOPIC LYSIS OF ADHESIONS;  Surgeon: Adin Hector, MD;  Location: [facility];  Service: General;  Laterality: N/A;  . TRANSANAL RECTAL RESECTION  08/2009   T1N0 rectal cancer Dr. Johney Maine  . TUBAL LIGATION       OB History   None      Home Medications    Prior to Admission medications   Medication Sig Start Date End Date Taking? Authorizing Provider  capsicum (MUSCLE RELIEF) 0.075 % topical cream Apply 1 application topically 2 (two) times daily as needed (muscle pain).    [provider]  diphenhydrAMINE (SOMINEX) 25 MG tablet Take 25 mg by mouth  at bedtime as needed for sleep.    [provider]  furosemide (LASIX) 20 MG tablet Take 20 mg by mouth daily. 02/17/18   [provider]  hydrochlorothiazide (HYDRODIURIL) 25 MG tablet Take 1 tablet (25 mg total) by mouth daily. 04/18/17   Tawny Asal, MD  meclizine (ANTIVERT) 25 MG tablet Take 1 tablet (25 mg total) by mouth 3 (three) times daily as needed for dizziness. 12/08/17   Fredia Sorrow, MD  Oxycodone HCl 10 MG TABS Take 10 mg by mouth every 8 (eight) hours as needed for pain. 03/09/18   [provider]  potassium chloride (K-DUR) 10 MEQ tablet Take 1 tablet (10 mEq total) by mouth 2 (two) times daily. 03/13/18   Jola Schmidt, MD  Psyllium (METAMUCIL PO) Take 1 packet by mouth daily.     [provider]  tetrahydrozoline 0.05 % ophthalmic solution Place 1 drop into both eyes as needed (allergies/red eyes). [place]    [provider]  XARELTO 20 MG TABS tablet TAKE 1 TABLET(20 MG) BY MOUTH DAILY WITH DINNER 03/11/18   Tawny Asal, MD    Family History Family History  Problem Relation Age of Onset  . Cancer Mother        unknown type but patient thinks it was bone marrow cancer  . Heart disease Sister   . Heart disease Brother   . Clotting disorder Brother   . Colon cancer Neg Hx   . Stomach cancer Neg Hx   . Rectal cancer Neg Hx   . Esophageal cancer Neg Hx     Social History Social History   Tobacco Use  . Smoking status: Current Some Day Smoker    Packs/day: 0.30    Years: 40.00    Pack years: 12.00    Types: Cigarettes  . Smokeless tobacco: Never Used  Substance Use Topics  . Alcohol use: No    Alcohol/week: 0.0 oz  . Drug use: No     Allergies   Other   Review of Systems Review of Systems  Constitutional: Negative for fever.  Gastrointestinal: Positive for abdominal pain and nausea. Negative for hematochezia and vomiting.  All other systems reviewed and are negative.    Physical Exam Updated Vital  Signs BP (!) 142/78 (BP Location: Left Arm)   Pulse 87   Temp 99.5 F (37.5 C) (Oral)   Resp 14   SpO2 99%   Physical Exam  Constitutional: She is oriented to person, place, and time. She appears well-developed and well-nourished. No distress.  HENT:  Head: Normocephalic and atraumatic.  Neck: Normal range of motion. Neck supple.  Cardiovascular: Normal rate and regular rhythm. Exam reveals no gallop and no friction rub.  No murmur heard. Pulmonary/Chest: Effort normal and breath sounds normal. No respiratory distress. She has no wheezes.  Abdominal: Soft. Bowel sounds are normal. She exhibits no distension. There is tenderness. There is no rigidity, no rebound  and no guarding.  There is mild, generalized abdominal tenderness.  Musculoskeletal: Normal range of motion.  Neurological: She is alert and oriented to person, place, and time.  Skin: Skin is warm and dry. She is not diaphoretic.  Nursing note and vitals reviewed.    ED Treatments / Results  Labs (all labs ordered are listed, but only abnormal results are displayed) Labs Reviewed  COMPREHENSIVE METABOLIC PANEL - Abnormal; Notable for the following components:      Result Value   Glucose, Bld 125 (*)    AST 13 (*)    All other components within normal limits  URINALYSIS, ROUTINE W REFLEX MICROSCOPIC - Abnormal; Notable for the following components:   APPearance HAZY (*)    Hgb urine dipstick MODERATE (*)    Nitrite POSITIVE (*)    Leukocytes, UA TRACE (*)    Bacteria, UA MANY (*)    All other components within normal limits  I-STAT BETA HCG BLOOD, ED ([facility], [facility], [facility] ONLY) - Abnormal; Notable for the following components:   I-stat hCG, quantitative 6.3 (*)    All other components within normal limits  LIPASE, BLOOD  CBC  POC URINE PREG, ED    EKG None  Radiology No results found.  Procedures Procedures (including critical care time)  Medications Ordered in ED Medications  sodium chloride 0.9 % bolus 1,000  mL (has no administration in time range)  ondansetron (ZOFRAN) injection 4 mg (has no administration in time range)  ketorolac (TORADOL) 30 MG/ML injection 30 mg (has no administration in time range)     Initial Impression / Assessment and Plan / ED Course  I have reviewed the triage vital signs and the nursing notes.  Pertinent labs & imaging results that were available during my care of the patient were reviewed by me and considered in my medical decision making (see chart for details).  Patient presents with diarrhea for the past 2 days.  This sounds infectious in nature, most likely viral.  Her stool has been nonbloody and abdominal exam is benign.  She will discharged with fluids, return prn.  Final Clinical Impressions(s) / ED Diagnoses   Final diagnoses:  None    ED Discharge Orders    None       Veryl Speak, MD 04/11/18 [phone]

## 2018-04-11 NOTE — Discharge Instructions (Addendum)
Continue to push fluids for the next 2 to 3 days.  Return to the emergency department for severe abdominal pain, bloody stools, high fevers, or other new and concerning symptoms.

## 2018-05-02 ENCOUNTER — Encounter: Payer: Self-pay | Admitting: Neurology

## 2018-05-02 ENCOUNTER — Ambulatory Visit: Payer: 59 | Admitting: Neurology

## 2018-05-02 VITALS — BP 136/68 | HR 106 | Ht 66.0 in | Wt 256.0 lb

## 2018-05-02 DIAGNOSIS — R42 Dizziness and giddiness: Secondary | ICD-10-CM | POA: Diagnosis not present

## 2018-05-02 DIAGNOSIS — E236 Other disorders of pituitary gland: Secondary | ICD-10-CM | POA: Diagnosis not present

## 2018-05-02 NOTE — Patient Instructions (Signed)
1.  I want to send you to an eye doctor to evaluate for any evidence of increased spinal fluid in the head.  If there is evidence of this, then next step would be a spinal tap.  If eye exam is okay, then no further testing is needed.

## 2018-05-02 NOTE — Progress Notes (Signed)
NEUROLOGY CONSULTATION NOTE  Alicia Martinez MRN: [id number] DOB: 02-Nov-1955  Referring provider: ED referral Primary care provider: York Ram, MD  Reason for consult:  vertigo  HISTORY OF PRESENT ILLNESS: Alicia Martinez is a 62 year old female with hypertension, OSA, arthritis and history of DVT/PE and rectal cancer who presents for vertigo.  History supplemented by ED note.  On the evening of 12/07/17, she developed onset of dizziness described as room spinning.  It was constant.  It was worse when laying down.  Nothing relieved it.  There was no associated nausea, double vision, headache or unilateral numbness and weakness..  She presented to the ED the following day for evaluation.  MRI of brain without contrast was performed, which was personally reviewed, and showed incidental partially empty sella and optic nerve sheath enlargement but no acute abnormality to explain the vertigo.  EKG and labs were also unrevealing.  She was discharged on Antivert with referral for neurology.  Vertigo lasted about a week and resolved.  She denies headaches.  She reports occasional blurred vision but not visual obscurations.  She reports pulsatile tinnitus.  PAST MEDICAL HISTORY: Past Medical History:  Diagnosis Date  . Allergy   . Anemia   . Arthritis    DJD, ARTHRITIS  . Cancer ([place])    rectal Ca  . Carotid artery plaque 08-30-13   PER CAROTID DOPPLER STUDY  . Deep vein thrombosis ([facility])    left lower extremity  . Diverticulitis large intestine   . Heart palpitations    HX OF PALPITATIONS AND SOME DIZZINESS - EVALUATED BY CARDIOLOGIST DR. ROSS - OFFICE NOTES IS EPIC 08/24/13 - "EVENT MONITOR SHOWED NO SIGNIFICANT ARRHYTHMIA"  . History of rectal cancer    T1N0 resected 08/2009  . Hypertension   . Infectious colitis   . Obesity   . Pain    LOWER BACK AND RT KNEE AND BOTH SHOULDERS - LEFT SHOULDER PAIN WORSE--PT TOLD ARTHRITIS  . Pulmonary embolism ([facility])    LAST ONE WAS  1995- ON CHRONIC COUMADIN  . Sleep apnea 08/09/2012   wears CPAP    PAST SURGICAL HISTORY: Past Surgical History:  Procedure Laterality Date  . ABDOMINAL HYSTERECTOMY    . APPENDECTOMY    . CECOSTOMY N/A 03/15/2014   Procedure: partial CECECTOMY;  Surgeon: Adin Hector, MD;  Location: [facility];  Service: General;  Laterality: N/A;  . COLONOSCOPY    . LAPAROSCOPIC APPENDECTOMY N/A 03/15/2014   Procedure: APPENDECTOMY LAPAROSCOPIC;  Surgeon: Adin Hector, MD;  Location: [facility];  Service: General;  Laterality: N/A;  . LAPAROSCOPIC LYSIS OF ADHESIONS N/A 03/15/2014   Procedure: LAPAROSCOPIC LYSIS OF ADHESIONS;  Surgeon: Adin Hector, MD;  Location: [facility];  Service: General;  Laterality: N/A;  . TRANSANAL RECTAL RESECTION  08/2009   T1N0 rectal cancer Dr. Johney Maine  . TUBAL LIGATION      MEDICATIONS: Current Outpatient Medications on File Prior to Visit  Medication Sig Dispense Refill  . capsicum (MUSCLE RELIEF) 0.075 % topical cream Apply 1 application topically 2 (two) times daily as needed (muscle pain).    Marland Kitchen diphenhydrAMINE (SOMINEX) 25 MG tablet Take 25 mg by mouth at bedtime as needed for sleep.    . furosemide (LASIX) 20 MG tablet Take 20 mg by mouth daily.  5  . hydrochlorothiazide (HYDRODIURIL) 25 MG tablet Take 1 tablet (25 mg total) by mouth daily. 90 tablet 3  . meclizine (ANTIVERT) 25 MG tablet Take 1 tablet (25  mg total) by mouth 3 (three) times daily as needed for dizziness. 30 tablet 0  . Oxycodone HCl 10 MG TABS Take 10 mg by mouth every 8 (eight) hours as needed for pain.  0  . potassium chloride (K-DUR) 10 MEQ tablet Take 1 tablet (10 mEq total) by mouth 2 (two) times daily. 8 tablet 0  . Psyllium (METAMUCIL PO) Take 1 packet by mouth daily.     Marland Kitchen tetrahydrozoline 0.05 % ophthalmic solution Place 1 drop into both eyes as needed (allergies/red eyes). VISINE    . XARELTO 20 MG TABS tablet TAKE 1 TABLET(20 MG) BY MOUTH DAILY WITH DINNER 90 tablet 3   No current  facility-administered medications on file prior to visit.     ALLERGIES: Allergies  Allergen Reactions  . Other     SOME ADHESIVES CAUSE SKIN IRRITATION  - STATES PAPER TAPE OK.  STATES SOME EKG ELECTRODES AND SOME SKIN PATCHES ALSO CAUSE IRRITATION    FAMILY HISTORY: Family History  Problem Relation Age of Onset  . Cancer Mother        unknown type but patient thinks it was bone marrow cancer  . Other Father        complications from severe burns  . Heart disease Sister   . Heart disease Brother   . Clotting disorder Brother   . Colon cancer Neg Hx   . Stomach cancer Neg Hx   . Rectal cancer Neg Hx   . Esophageal cancer Neg Hx     SOCIAL HISTORY: Social History   Socioeconomic History  . Marital status: Single    Spouse name: Not on file  . Number of children: 3  . Years of education: Not on file  . Highest education level: GED or equivalent  Occupational History    Employer: [employer]  . Occupation: MACHINE OPERATOR    Employer: [employer]  . Financial resource strain: Not on file  . Food insecurity:    Worry: Not on file    Inability: Not on file  . Transportation needs:    Medical: Not on file    Non-medical: Not on file  Tobacco Use  . Smoking status: Current Some Day Smoker    Packs/day: 0.30    Years: 40.00    Pack years: 12.00    Types: Cigarettes  . Smokeless tobacco: Never Used  Substance and Sexual Activity  . Alcohol use: No    Alcohol/week: 0.0 oz  . Drug use: No  . Sexual activity: Not on file  Lifestyle  . Physical activity:    Days per week: Not on file    Minutes per session: Not on file  . Stress: Not on file  Relationships  . Social connections:    Talks on phone: Not on file    Gets together: Not on file    Attends religious service: Not on file    Active member of club or organization: Not on file    Attends meetings of clubs or organizations: Not on file    Relationship status: Not on file  .  Intimate partner violence:    Fear of current or ex partner: Not on file    Emotionally abused: Not on file    Physically abused: Not on file    Forced sexual activity: Not on file  Other Topics Concern  . Not on file  Social History Narrative   Patient does not regular exercise. He has three boys. Daily  caffeine use 2/day.      Patient is right handed    REVIEW OF SYSTEMS: Constitutional: No fevers, chills, or sweats, no generalized fatigue, change in appetite Eyes: No visual changes, double vision, eye pain Ear, nose and throat: No hearing loss, ear pain, nasal congestion, sore throat Cardiovascular: No chest pain, palpitations Respiratory:  No shortness of breath at rest or with exertion, wheezes GastrointestinaI: No nausea, vomiting, diarrhea, abdominal pain, fecal incontinence Genitourinary:  No dysuria, urinary retention or frequency Musculoskeletal:  No neck pain, back pain Integumentary: No rash, pruritus, skin lesions Neurological: as above Psychiatric: No depression, insomnia, anxiety Endocrine: No palpitations, fatigue, diaphoresis, mood swings, change in appetite, change in weight, increased thirst Hematologic/Lymphatic:  No purpura, petechiae. Allergic/Immunologic: no itchy/runny eyes, nasal congestion, recent allergic reactions, rashes  PHYSICAL EXAM: Vitals:   05/02/18 1446  BP: 136/68  Pulse: (!) 106  SpO2: 96%   General: No acute distress.  Patient appears well-groomed.  Head:  Normocephalic/atraumatic Eyes:  fundi examined but not visualized Neck: supple, no paraspinal tenderness, full range of motion Back: No paraspinal tenderness Heart: regular rate and rhythm Lungs: Clear to auscultation bilaterally. Vascular: No carotid bruits. Neurological Exam: Mental status: alert and oriented to person, place, and time, recent and remote memory intact, fund of knowledge intact, attention and concentration intact, speech fluent and not dysarthric, language  intact. Cranial nerves: CN I: not tested CN II: pupils equal, round and reactive to light, visual fields intact CN [name], IV, VI:  full range of motion, no nystagmus, no ptosis CN V: facial sensation intact CN VII: upper and lower face symmetric CN VIII: hearing intact CN IX, X: gag intact, uvula midline CN XI: sternocleidomastoid and trapezius muscles intact CN XII: tongue midline Bulk & Tone: normal, no fasciculations. Motor:  5/5 throughout  Sensation: temperature and vibration sensation intact. Deep Tendon Reflexes:  2+ throughout, toes downgoing.  Finger to nose testing:  Without dysmetria.  Heel to shin:  Without dysmetria.  Gait:  Normal station and stride.  Romberg negative.  IMPRESSION: 1.  Vertigo, likely BPPV, resolved 2.  MRI findings consistent with idiopathic intracranial hypertension.  May be of no clinical significance.  Other than some pulsatile tinnitus at times, she denies associated clinical symptoms.  PLAN: Refer to ophthalmology to evaluate for papilledema.  If positive, then proceed with lumbar puncture.  If negative, no further workup warranted.  Thank you for allowing me to take part in the care of this patient.  Metta Clines, DO  CC:  York Ram, MD

## 2018-06-16 ENCOUNTER — Emergency Department (HOSPITAL_COMMUNITY): Payer: 59

## 2018-06-16 ENCOUNTER — Encounter (HOSPITAL_COMMUNITY): Payer: Self-pay

## 2018-06-16 ENCOUNTER — Emergency Department (HOSPITAL_COMMUNITY)
Admission: EM | Admit: 2018-06-16 | Discharge: 2018-06-16 | Disposition: A | Discharge status: Home / Self Care | Payer: 59 | Attending: Emergency Medicine | Admitting: Emergency Medicine

## 2018-06-16 ENCOUNTER — Other Ambulatory Visit: Payer: Self-pay

## 2018-06-16 DIAGNOSIS — N390 Urinary tract infection, site not specified: Secondary | ICD-10-CM | POA: Diagnosis not present

## 2018-06-16 DIAGNOSIS — R739 Hyperglycemia, unspecified: Secondary | ICD-10-CM

## 2018-06-16 DIAGNOSIS — Z79899 Other long term (current) drug therapy: Secondary | ICD-10-CM | POA: Insufficient documentation

## 2018-06-16 DIAGNOSIS — E1165 Type 2 diabetes mellitus with hyperglycemia: Secondary | ICD-10-CM | POA: Diagnosis not present

## 2018-06-16 DIAGNOSIS — M6281 Muscle weakness (generalized): Secondary | ICD-10-CM | POA: Insufficient documentation

## 2018-06-16 DIAGNOSIS — R631 Polydipsia: Secondary | ICD-10-CM | POA: Diagnosis present

## 2018-06-16 DIAGNOSIS — F17210 Nicotine dependence, cigarettes, uncomplicated: Secondary | ICD-10-CM | POA: Insufficient documentation

## 2018-06-16 DIAGNOSIS — Z86718 Personal history of other venous thrombosis and embolism: Secondary | ICD-10-CM | POA: Insufficient documentation

## 2018-06-16 DIAGNOSIS — R05 Cough: Secondary | ICD-10-CM | POA: Insufficient documentation

## 2018-06-16 DIAGNOSIS — Z86711 Personal history of pulmonary embolism: Secondary | ICD-10-CM | POA: Diagnosis not present

## 2018-06-16 DIAGNOSIS — Z7901 Long term (current) use of anticoagulants: Secondary | ICD-10-CM | POA: Insufficient documentation

## 2018-06-16 DIAGNOSIS — I10 Essential (primary) hypertension: Secondary | ICD-10-CM | POA: Insufficient documentation

## 2018-06-16 DIAGNOSIS — Z85040 Personal history of malignant carcinoid tumor of rectum: Secondary | ICD-10-CM | POA: Diagnosis not present

## 2018-06-16 LAB — COMPREHENSIVE METABOLIC PANEL
ALBUMIN: 4.1 g/dL (ref 3.5–5.0)
ALK PHOS: 127 U/L — AB (ref 38–126)
ALT: 19 U/L (ref 0–44)
AST: 18 U/L (ref 15–41)
Anion gap: 14 (ref 5–15)
BILIRUBIN TOTAL: 1 mg/dL (ref 0.3–1.2)
BUN: 12 mg/dL (ref 6–20)
CALCIUM: 9.8 mg/dL (ref 8.9–10.3)
CO2: 30 mmol/L (ref 22–32)
CREATININE: 0.84 mg/dL (ref 0.44–1.00)
Chloride: 88 mmol/L — ABNORMAL LOW (ref 98–111)
GFR calc non Af Amer: >60 mL/min (ref >60)
GLUCOSE: 662 mg/dL — AB (ref 70–99)
Potassium: 3.6 mmol/L (ref 3.5–5.1)
SODIUM: 132 mmol/L — AB (ref 135–145)
Total Protein: 8.6 g/dL — ABNORMAL HIGH (ref 6.5–8.1)

## 2018-06-16 LAB — BLOOD GAS, VENOUS
Acid-Base Excess: 6.2 mmol/L — ABNORMAL HIGH (ref 0.0–2.0)
Bicarbonate: 30.4 mmol/L — ABNORMAL HIGH (ref 20.0–28.0)
FIO2: 21
O2 Saturation: 87.7 %
PCO2 VEN: 43.8 mmHg — AB (ref 44.0–60.0)
PH VEN: 7.456 — AB (ref 7.250–7.430)
PO2 VEN: 55 mmHg — AB (ref 32.0–45.0)
Patient temperature: 98.6

## 2018-06-16 LAB — URINALYSIS, ROUTINE W REFLEX MICROSCOPIC
Bilirubin Urine: NEGATIVE
KETONES UR: NEGATIVE mg/dL
Nitrite: NEGATIVE
PROTEIN: NEGATIVE mg/dL
Specific Gravity, Urine: 1.026 (ref 1.005–1.030)
pH: 6 (ref 5.0–8.0)

## 2018-06-16 LAB — I-STAT CG4 LACTIC ACID, ED
LACTIC ACID, VENOUS: 1.97 mmol/L — AB (ref 0.5–1.9)
Lactic Acid, Venous: 2.27 mmol/L (ref 0.5–1.9)

## 2018-06-16 LAB — CBC WITH DIFFERENTIAL/PLATELET
BASOS ABS: 0 10*3/uL (ref 0.0–0.1)
BASOS PCT: 0 %
Eosinophils Absolute: 0.1 10*3/uL (ref 0.0–0.7)
Eosinophils Relative: 1 %
HEMATOCRIT: 39.3 % (ref 36.0–46.0)
HEMOGLOBIN: 13.5 g/dL (ref 12.0–15.0)
Lymphocytes Relative: 29 %
Lymphs Abs: 1.5 10*3/uL (ref 0.7–4.0)
MCH: 29 pg (ref 26.0–34.0)
MCHC: 34.4 g/dL (ref 30.0–36.0)
MCV: 84.5 fL (ref 78.0–100.0)
MONOS PCT: 6 %
Monocytes Absolute: 0.3 10*3/uL (ref 0.1–1.0)
NEUTROS PCT: 64 %
Neutro Abs: 3.4 10*3/uL (ref 1.7–7.7)
Platelets: 329 10*3/uL (ref 150–400)
RBC: 4.65 MIL/uL (ref 3.87–5.11)
RDW: 13.5 % (ref 11.5–15.5)
WBC: 5.3 10*3/uL (ref 4.0–10.5)

## 2018-06-16 LAB — TROPONIN I: Troponin I: <0.03 ng/mL (ref <0.03)

## 2018-06-16 LAB — I-STAT CHEM 8, ED
BUN: 11 mg/dL (ref 6–20)
CALCIUM ION: 1.16 mmol/L (ref 1.15–1.40)
CREATININE: 0.7 mg/dL (ref 0.44–1.00)
Chloride: 88 mmol/L — ABNORMAL LOW (ref 98–111)
Glucose, Bld: 646 mg/dL (ref 70–99)
HCT: 41 % (ref 36.0–46.0)
Hemoglobin: 13.9 g/dL (ref 12.0–15.0)
Potassium: 3.6 mmol/L (ref 3.5–5.1)
Sodium: 131 mmol/L — ABNORMAL LOW (ref 135–145)
TCO2: 32 mmol/L (ref 22–32)

## 2018-06-16 LAB — CBG MONITORING, ED
Glucose-Capillary: 559 mg/dL (ref 70–99)
Glucose-Capillary: 434 mg/dL — ABNORMAL HIGH (ref 70–99)
Glucose-Capillary: 374 mg/dL — ABNORMAL HIGH (ref 70–99)

## 2018-06-16 MED ORDER — INSULIN ASPART 100 UNIT/ML ~~LOC~~ SOLN: 10.0000 [IU] | Freq: Once | SUBCUTANEOUS | Status: DC

## 2018-06-16 MED ORDER — SODIUM CHLORIDE 0.9 % IV BOLUS
1000.0000 mL | Freq: Once | INTRAVENOUS | Status: AC
  Administered 2018-06-16: 1000 mL via INTRAVENOUS

## 2018-06-16 MED ORDER — INSULIN ASPART 100 UNIT/ML ~~LOC~~ SOLN
10.0000 [IU] | Freq: Once | SUBCUTANEOUS | Status: AC
  Administered 2018-06-16: 10 [IU] via INTRAVENOUS
  Filled 2018-06-16: qty 1

## 2018-06-16 MED ORDER — POTASSIUM CHLORIDE CRYS ER 20 MEQ PO TBCR
40.0000 meq | EXTENDED_RELEASE_TABLET | Freq: Once | ORAL | Status: AC
  Administered 2018-06-16: 40 meq via ORAL
  Filled 2018-06-16: qty 2

## 2018-06-16 MED ORDER — INSULIN ASPART 100 UNIT/ML ~~LOC~~ SOLN
10.0000 [IU] | Freq: Once | SUBCUTANEOUS | Status: AC
  Administered 2018-06-16: 10 [IU] via SUBCUTANEOUS
  Filled 2018-06-16: qty 1

## 2018-06-16 MED ORDER — SODIUM CHLORIDE 0.9 % IV SOLN
2.0000 g | Freq: Once | INTRAVENOUS | Status: AC
  Administered 2018-06-16: 2 g via INTRAVENOUS
  Filled 2018-06-16: qty 20

## 2018-06-16 MED ORDER — METFORMIN HCL 500 MG PO TABS: 500.0000 mg | ORAL_TABLET | Freq: Two times a day (BID) | ORAL | 0 refills | Status: DC

## 2018-06-16 MED ORDER — CEPHALEXIN 500 MG PO CAPS: 500.0000 mg | ORAL_CAPSULE | Freq: Three times a day (TID) | ORAL | 0 refills | Status: AC

## 2018-06-16 MED ORDER — ONDANSETRON 4 MG PO TBDP: 4.0000 mg | ORAL_TABLET | Freq: Three times a day (TID) | ORAL | 0 refills | Status: DC | PRN

## 2018-06-16 NOTE — Discharge Instructions (Addendum)
As we discussed, your blood sugar was very elevated today.  This is concerning for worsening diabetes.  I will start you on a low-dose medication to help with this, but is very important follow-up with your doctor in the next week to discuss further treatment and repeat your labs.  Drink at least 8 to 10 glasses of water daily.  Try to avoid sugary or foods high in carbohydrates.  Take the antibiotics as prescribed.

## 2018-06-16 NOTE — ED Notes (Signed)
CRITICAL VALUE STICKER  CRITICAL VALUE: glucose = [phone]  RECEIVER (on-site recipient of call): ABrawner,Rn  DATE & TIME NOTIFIED: 06/16/18 0907  MESSENGER (representative from lab): Colletta [place]  MD NOTIFIED: EDMD Isaacs  TIME OF NOTIFICATION: 0908  RESPONSE: See new orders  This Probation officer notified patient primary RN Ali Lowe of critical value.

## 2018-06-16 NOTE — ED Provider Notes (Addendum)
[place] [facility] DEPT Provider Note   CSN: [id number] Arrival date & time: 06/16/18  0758     History   Chief Complaint Chief Complaint  Patient presents with  . Urinary Frequency  . Fatigue  . Sore Throat  . Hyperglycemia    HPI Alicia Martinez is a 62 y.o. female.  HPI  62 year old female with extensive past medical history as below including diet-controlled diabetes, history of rectal cancer, history of DVT on Xarelto, here with generalized weakness.  The patient states that approximately 1 week ago, she began to develop extreme thirst, polyuria, and generalized weakness.  She states she cannot drink enough water and feels constantly thirsty.  She has an associated dry, scratchy sensation in her throat.  She had associated generalized weakness and fatigue.  Her appetite has been poor.  She denies any preceding illnesses, though she does note that over the last 3 to 4 days, she has had a cough productive of yellow-green sputum.  Denies any known fevers.  No abdominal pain, nausea, vomiting, or diarrhea.  No shortness of breath.  She states her diabetes is diet-controlled but admits to poor diet as well as not checking her blood sugars at home.  She is not currently on insulin.  No other recent medication changes.  She has not recently been on any steroids.  Denies any alleviating factors. Has not tried anything for her throat or fatigue.   Past Medical History:  Diagnosis Date  . Allergy   . Anemia   . Arthritis    DJD, ARTHRITIS  . Cancer ([place])    rectal Ca  . Carotid artery plaque 08-30-13   PER CAROTID DOPPLER STUDY  . Deep vein thrombosis (HCC)    left lower extremity  . Diverticulitis large intestine   . Heart palpitations    HX OF PALPITATIONS AND SOME DIZZINESS - EVALUATED BY CARDIOLOGIST DR. ROSS - OFFICE NOTES IS EPIC 08/24/13 - "EVENT MONITOR SHOWED NO SIGNIFICANT ARRHYTHMIA"  . History of rectal cancer    T1N0 resected 08/2009  .  Hypertension   . Infectious colitis   . Obesity   . Pain    LOWER BACK AND RT KNEE AND BOTH SHOULDERS - LEFT SHOULDER PAIN WORSE--PT TOLD ARTHRITIS  . Pulmonary embolism (HCC)    LAST ONE WAS 1995- ON CHRONIC COUMADIN  . Sleep apnea 08/09/2012   wears CPAP    Patient Active Problem List   Diagnosis Date Noted  . Diabetes mellitus type 2, diet-controlled ([place]) 08/09/2017  . Onychomycosis of toenail 12/30/2015  . Left hip pain 11/03/2015  . Multiple thyroid nodules 11/03/2015  . Diverticulosis 10/30/2015  . 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 Osteoarthritis of multiple joints 12/31/2013  . Health care maintenance 10/31/2012  . Obstructive sleep apnea on CPAP 08/09/2012  . Adnexal mass 01/18/2011  . Long term current use of anticoagulants due to h/o PE and recurrent DVT with INR goal of 2.0-3.0 10/17/2010  . Hematuria 12/01/2009  . ADENOCARCINOMA, RECTUM 08/06/2009  . PERS HX MAL NEOPLSM RECT RECTOSIGMOID JUNC&ANUS 08/06/2009  . Obesity, Class [name], BMI 40-49.9 (morbid obesity) ([place]) 02/27/2009  . History of tobacco use disorder 02/27/2009  . Essential hypertension 02/27/2009    Past Surgical History:  Procedure Laterality Date  . ABDOMINAL HYSTERECTOMY    . APPENDECTOMY    . CECOSTOMY N/A 03/15/2014   Procedure: partial CECECTOMY;  Surgeon: Adin Hector, MD;  Location: [facility];  Service:  General;  Laterality: N/A;  . COLONOSCOPY    . LAPAROSCOPIC APPENDECTOMY N/A 03/15/2014   Procedure: APPENDECTOMY LAPAROSCOPIC;  Surgeon: Adin Hector, MD;  Location: [facility];  Service: General;  Laterality: N/A;  . LAPAROSCOPIC LYSIS OF ADHESIONS N/A 03/15/2014   Procedure: LAPAROSCOPIC LYSIS OF ADHESIONS;  Surgeon: Adin Hector, MD;  Location: [facility];  Service: General;  Laterality: N/A;  . TRANSANAL RECTAL RESECTION  08/2009   T1N0 rectal cancer Dr. Johney Maine  . TUBAL LIGATION       OB History   None      Home Medications    Prior to  Admission medications   Medication Sig Start Date End Date Taking? Authorizing Provider  capsicum (MUSCLE RELIEF) 0.075 % topical cream Apply 1 application topically 2 (two) times daily as needed (muscle pain).   Yes [provider]  diphenhydrAMINE (BENADRYL) 25 MG tablet Take 25 mg by mouth every 4 (four) hours as needed (Patient felt like she was coming down with something and benadryl would help).    Yes [provider]  furosemide (LASIX) 20 MG tablet Take 20 mg by mouth daily. 02/17/18  Yes [provider]  hydrochlorothiazide (HYDRODIURIL) 25 MG tablet Take 1 tablet (25 mg total) by mouth daily. 04/18/17  Yes Tawny Asal, MD  meclizine (ANTIVERT) 25 MG tablet Take 1 tablet (25 mg total) by mouth 3 (three) times daily as needed for dizziness. 12/08/17  Yes Fredia Sorrow, MD  Oxycodone HCl 10 MG TABS Take 10 mg by mouth every 8 (eight) hours as needed for pain. 03/09/18  Yes [provider]  potassium chloride (K-DUR) 10 MEQ tablet Take 1 tablet (10 mEq total) by mouth 2 (two) times daily. Patient taking differently: Take 10 mEq by mouth daily.  03/13/18  Yes Jola Schmidt, MD  Psyllium (METAMUCIL PO) Take 1 packet by mouth daily.    Yes [provider]  tetrahydrozoline 0.05 % ophthalmic solution Place 1 drop into both eyes as needed (allergies/red eyes). VISINE   Yes [provider]  XARELTO 20 MG TABS tablet TAKE 1 TABLET(20 MG) BY MOUTH DAILY WITH DINNER 03/11/18  Yes Tawny Asal, MD  cephALEXin (KEFLEX) 500 MG capsule Take 1 capsule (500 mg total) by mouth 3 (three) times daily for 7 days. 06/16/18 06/23/18  Duffy Bruce, MD  metFORMIN (GLUCOPHAGE) 500 MG tablet Take 1 tablet (500 mg total) by mouth 2 (two) times daily for 7 days. 06/16/18 06/23/18  Duffy Bruce, MD  ondansetron (ZOFRAN ODT) 4 MG disintegrating tablet Take 1 tablet (4 mg total) by mouth every 8 (eight) hours as needed for nausea or vomiting. 06/16/18   Duffy Bruce,  MD    Family History Family History  Problem Relation Age of Onset  . Cancer Mother        unknown type but patient thinks it was bone marrow cancer  . Other Father        complications from severe burns  . Heart disease Sister   . Heart disease Brother   . Clotting disorder Brother   . Colon cancer Neg Hx   . Stomach cancer Neg Hx   . Rectal cancer Neg Hx   . Esophageal cancer Neg Hx     Social History Social History   Tobacco Use  . Smoking status: Current Some Day Smoker    Packs/day: 0.30    Years: 40.00    Pack years: 12.00    Types: Cigarettes  . Smokeless tobacco: Never  Used  Substance Use Topics  . Alcohol use: No    Alcohol/week: 0.0 standard drinks  . Drug use: No     Allergies   Other   Review of Systems Review of Systems  Constitutional: Positive for fatigue. Negative for chills and fever.  HENT: Negative for congestion, rhinorrhea and sore throat.   Eyes: Negative for visual disturbance.  Respiratory: Positive for cough. Negative for shortness of breath and wheezing.   Cardiovascular: Negative for chest pain and leg swelling.  Gastrointestinal: Negative for abdominal pain, diarrhea, nausea and vomiting.  Endocrine: Positive for polydipsia, polyphagia and polyuria.  Genitourinary: Negative for dysuria, flank pain, vaginal bleeding and vaginal discharge.  Musculoskeletal: Negative for neck pain.  Skin: Negative for rash.  Allergic/Immunologic: Negative for immunocompromised state.  Neurological: Positive for weakness. Negative for syncope and headaches.  Hematological: Does not bruise/bleed easily.  All other systems reviewed and are negative.    Physical Exam Updated Vital Signs BP 127/61   Pulse 81   Temp 98.6 F (37 C) (Oral)   Resp 15   Ht 5\' 6"  (1.676 m)   Wt 113.4 kg   SpO2 99%   BMI 40.35 kg/m   Physical Exam  Constitutional: She is oriented to person, place, and time. She appears well-developed and well-nourished. No  distress.  HENT:  Head: Normocephalic and atraumatic.  Dry MM. Dry tongue w/ patches but no palatal or posterior oropharyngeal plaques, lesions, or erythema. No tonsillar edema or asymmetry.  Eyes: Conjunctivae are normal.  Neck: Neck supple.  Cardiovascular: Normal rate, regular rhythm and normal heart sounds. Exam reveals no friction rub.  No murmur heard. Pulmonary/Chest: Effort normal and breath sounds normal. No respiratory distress. She has no wheezes. She has no rales.  Abdominal: Soft. She exhibits no distension. There is no tenderness. There is no rebound and no guarding.  Musculoskeletal: She exhibits no edema.  Neurological: She is alert and oriented to person, place, and time. She exhibits normal muscle tone.  Skin: Skin is warm. Capillary refill takes less than 2 seconds.  Psychiatric: She has a normal mood and affect.  Nursing note and vitals reviewed.    ED Treatments / Results  Labs (all labs ordered are listed, but only abnormal results are displayed) Labs Reviewed  COMPREHENSIVE METABOLIC PANEL - Abnormal; Notable for the following components:      Result Value   Sodium 132 (*)    Chloride 88 (*)    Glucose, Bld 662 (*)    Total Protein 8.6 (*)    Alkaline Phosphatase 127 (*)    All other components within normal limits  BLOOD GAS, VENOUS - Abnormal; Notable for the following components:   pH, Ven 7.456 (*)    pCO2, Ven 43.8 (*)    pO2, Ven 55.0 (*)    Bicarbonate 30.4 (*)    Acid-Base Excess 6.2 (*)    All other components within normal limits  URINALYSIS, ROUTINE W REFLEX MICROSCOPIC - Abnormal; Notable for the following components:   Color, Urine STRAW (*)    Glucose, UA >=500 (*)    Hgb urine dipstick SMALL (*)    Leukocytes, UA TRACE (*)    Bacteria, UA RARE (*)    All other components within normal limits  CBG MONITORING, ED - Abnormal; Notable for the following components:   Glucose-Capillary >600 (*)    All other components within normal limits    I-STAT CHEM 8, ED - Abnormal; Notable for the following components:  Sodium 131 (*)    Chloride 88 (*)    Glucose, Bld 646 (*)    All other components within normal limits  I-STAT CG4 LACTIC ACID, ED - Abnormal; Notable for the following components:   Lactic Acid, Venous 2.27 (*)    All other components within normal limits  CBG MONITORING, ED - Abnormal; Notable for the following components:   Glucose-Capillary 559 (*)    All other components within normal limits  I-STAT CG4 LACTIC ACID, ED - Abnormal; Notable for the following components:   Lactic Acid, Venous 1.97 (*)    All other components within normal limits  CBG MONITORING, ED - Abnormal; Notable for the following components:   Glucose-Capillary 434 (*)    All other components within normal limits  CBG MONITORING, ED - Abnormal; Notable for the following components:   Glucose-Capillary 374 (*)    All other components within normal limits  URINE CULTURE  CBC WITH DIFFERENTIAL/PLATELET  TROPONIN I    EKG EKG Interpretation  Date/Time:  Friday June 16 2018 08:36:06 EDT Ventricular Rate:  78 PR Interval:    QRS Duration: 116 QT Interval:  403 QTC Calculation: 459 R Axis:   35 Text Interpretation:  Sinus rhythm Incomplete left bundle branch block Low voltage, precordial leads No significant change since last tracing Confirmed by Duffy Bruce [phone]) on 06/16/2018 8:42:49 AM Also confirmed by Duffy Bruce [phone]), editor Philomena Doheny [phone])  on 06/16/2018 1:08:14 PM   Radiology Dg Chest 2 View  Result Date: 06/16/2018 CLINICAL DATA:  Cough EXAM: CHEST - 2 VIEW COMPARISON:  03/08/2014 FINDINGS: Heart and mediastinal contours are within normal limits. No focal opacities or effusions. No acute bony abnormality. IMPRESSION: No active cardiopulmonary disease. Electronically Signed   By: Rolm Baptise M.D.   On: 06/16/2018 09:18    Procedures .Critical Care Performed by: Duffy Bruce, MD Authorized by:  Duffy Bruce, MD   Critical care provider statement:    Critical care time (minutes):  35   Critical care time was exclusive of:  Separately billable procedures and treating other patients and teaching time   Critical care was necessary to treat or prevent imminent or life-threatening deterioration of the following conditions:  Circulatory failure and metabolic crisis   Critical care was time spent personally by me on the following activities:  Development of treatment plan with patient or surrogate, discussions with consultants, evaluation of patient's response to treatment, examination of patient, obtaining history from patient or surrogate, ordering and performing treatments and interventions, ordering and review of laboratory studies, ordering and review of radiographic studies, pulse oximetry, re-evaluation of patient's condition and review of old charts   I assumed direction of critical care for this patient from another provider in my specialty: no     (including critical care time)  Medications Ordered in ED Medications  sodium chloride 0.9 % bolus 1,000 mL (0 mLs Intravenous Stopped 06/16/18 0947)  sodium chloride 0.9 % bolus 1,000 mL (0 mLs Intravenous Stopped 06/16/18 1052)  insulin aspart (novoLOG) injection 10 Units (10 Units Intravenous Given 06/16/18 0923)  cefTRIAXone (ROCEPHIN) 2 g in sodium chloride 0.9 % 100 mL IVPB (0 g Intravenous Stopped 06/16/18 1010)  insulin aspart (novoLOG) injection 10 Units (10 Units Intravenous Given 06/16/18 1011)  potassium chloride SA (K-DUR,KLOR-CON) CR tablet 40 mEq (40 mEq Oral Given 06/16/18 1127)  insulin aspart (novoLOG) injection 10 Units (10 Units Subcutaneous Given 06/16/18 1127)     Initial Impression / Assessment and Plan /  ED Course  I have reviewed the triage vital signs and the nursing notes.  Pertinent labs & imaging results that were available during my care of the patient were reviewed by me and considered in my medical decision  making (see chart for details).  Clinical Course as of Jun 17 1535  Fri Jun 16, 2018  8016 Consistent w/ history - will start IVF, check lytes before giving insulin.  CBG monitoring, ED(!!) [CI]  0845 Likely 2/2 dehydration, hyperglycemia but will f/u remainder of labs, imaging. Pt w/o other signs of sepsis currently. 2L IVF given (EF nl on TTE 2014).  I-Stat CG4 Lactic Acid, ED(!!) [CI]  [phone] Lab work reviewed and is consistent with likely dehydration and hyperglycemia without evidence of DKA.  Blood gas shows normal pH.  Patient is alert, oriented, without signs of HHS.  She appears improved with fluids.  UA does show some pyuria and I suspect she may have UTI precipitating hyperglycemia.  Will give IV fluids, antibiotics, and plan to recheck sugar.  If improved, can likely manage as outpatient with PCP follow-up.   [CI]    Clinical Course User Index [CI] Duffy Bruce, MD    62 year old female here with generalized weakness, polyuria, and polydipsia.  Suspect symptomatic hyperglycemia, likely due to dietary indiscretion with underlying diabetes.  Patient also with cough so will check chest x-ray to evaluate for precipitating illness such as bronchitis or pneumonia.  Otherwise, patient appears clinically dehydrated.  She has a reported history of CHF but will give cautious fluids, plan for insulin pending electrolyte results.  See ED course for further updates.   See ED course above for details.  Following additional doses of insulin, repeat sugar 300s.  She appears markedly improved.  She feels much better.  I suspect this is hyperglycemia and exacerbation of her underlying diabetes in the setting of UTI.  No CVA tenderness, vomiting, fever, or evidence of pyelonephritis.  Will treat her for UTI, start her on a low-dose of metformin.  She did have a mild lactic elevation but has no history of renal or liver insufficiency or risk factors for metformin induced lactic acidosis and will give her a  low-dose for 1 week only and have her call her PCP.  Final Clinical Impressions(s) / ED Diagnoses   Final diagnoses:  Lower urinary tract infectious disease  Hyperglycemia    ED Discharge Orders         Ordered    metFORMIN (GLUCOPHAGE) 500 MG tablet  2 times daily     06/16/18 1216    cephALEXin (KEFLEX) 500 MG capsule  3 times daily     06/16/18 1216    ondansetron (ZOFRAN ODT) 4 MG disintegrating tablet  Every 8 hours PRN     06/16/18 1216           Duffy Bruce, MD 06/16/18 1536    Duffy Bruce, MD 06/26/18 [id number]

## 2018-06-16 NOTE — ED Triage Notes (Signed)
Patient c/o sore throat, feeling fatigue and voiding large large amounts frequently x1 week.

## 2018-06-16 NOTE — ED Notes (Signed)
ED Provider at bedside. 

## 2018-06-16 NOTE — ED Notes (Signed)
Patient transported to X-ray 

## 2018-06-16 NOTE — ED Notes (Signed)
Called [facility] and informed them of VBG in mini-lab. RT will be in route to lab ASAP.

## 2018-06-18 LAB — URINE CULTURE: Special Requests: NORMAL

## 2018-06-19 ENCOUNTER — Telehealth: Payer: Self-pay | Admitting: Emergency Medicine

## 2018-06-19 NOTE — Telephone Encounter (Signed)
Post ED Visit - Positive Culture Follow-up  Culture report reviewed by antimicrobial stewardship pharmacist:  []  Elenor Quinones, Pharm.D. []  Heide Guile, Pharm.D., BCPS AQ-ID []  Parks Neptune, Pharm.D., BCPS []  Alycia Rossetti, Pharm.D., BCPS []  [place], Pharm.D., BCPS, AAHIVP []  Legrand Como, Pharm.D., BCPS, AAHIVP []  Salome Arnt, PharmD, BCPS []  Johnnette Gourd, PharmD, BCPS [x]  Hughes Better, PharmD, BCPS []  Leeroy Cha, PharmD  Positive urine culture Treated with cephalexin, organism sensitive to the same and no further patient follow-up is required at this time.  Hazle Nordmann 06/19/2018, 3:22 PM

## 2018-07-04 ENCOUNTER — Ambulatory Visit: Payer: 59 | Admitting: Physician Assistant

## 2019-03-23 ENCOUNTER — Other Ambulatory Visit: Payer: Self-pay | Admitting: Internal Medicine

## 2019-06-29 ENCOUNTER — Other Ambulatory Visit: Payer: Self-pay | Admitting: Specialist

## 2019-06-29 DIAGNOSIS — Z1231 Encounter for screening mammogram for malignant neoplasm of breast: Secondary | ICD-10-CM

## 2019-08-02 ENCOUNTER — Other Ambulatory Visit: Payer: Self-pay

## 2019-08-02 DIAGNOSIS — Z20822 Contact with and (suspected) exposure to covid-19: Secondary | ICD-10-CM

## 2019-08-03 LAB — NOVEL CORONAVIRUS, NAA: SARS-CoV-2, NAA: DETECTED — AB

## 2019-08-11 ENCOUNTER — Emergency Department (HOSPITAL_COMMUNITY): Payer: BC Managed Care – PPO

## 2019-08-11 ENCOUNTER — Emergency Department (HOSPITAL_COMMUNITY)
Admission: EM | Admit: 2019-08-11 | Discharge: 2019-08-12 | Disposition: A | Discharge status: Home / Self Care | Payer: BC Managed Care – PPO | Attending: Emergency Medicine | Admitting: Emergency Medicine

## 2019-08-11 ENCOUNTER — Other Ambulatory Visit: Payer: Self-pay

## 2019-08-11 DIAGNOSIS — U071 COVID-19: Secondary | ICD-10-CM | POA: Diagnosis not present

## 2019-08-11 DIAGNOSIS — Z85048 Personal history of other malignant neoplasm of rectum, rectosigmoid junction, and anus: Secondary | ICD-10-CM | POA: Insufficient documentation

## 2019-08-11 DIAGNOSIS — I10 Essential (primary) hypertension: Secondary | ICD-10-CM | POA: Insufficient documentation

## 2019-08-11 DIAGNOSIS — R55 Syncope and collapse: Secondary | ICD-10-CM | POA: Diagnosis not present

## 2019-08-11 DIAGNOSIS — R42 Dizziness and giddiness: Secondary | ICD-10-CM | POA: Diagnosis present

## 2019-08-11 DIAGNOSIS — F17210 Nicotine dependence, cigarettes, uncomplicated: Secondary | ICD-10-CM | POA: Insufficient documentation

## 2019-08-11 DIAGNOSIS — Z7901 Long term (current) use of anticoagulants: Secondary | ICD-10-CM | POA: Insufficient documentation

## 2019-08-11 DIAGNOSIS — Z79899 Other long term (current) drug therapy: Secondary | ICD-10-CM | POA: Diagnosis not present

## 2019-08-11 DIAGNOSIS — Z7984 Long term (current) use of oral hypoglycemic drugs: Secondary | ICD-10-CM | POA: Diagnosis not present

## 2019-08-11 DIAGNOSIS — E119 Type 2 diabetes mellitus without complications: Secondary | ICD-10-CM | POA: Diagnosis not present

## 2019-08-11 DIAGNOSIS — E876 Hypokalemia: Secondary | ICD-10-CM | POA: Insufficient documentation

## 2019-08-11 LAB — BASIC METABOLIC PANEL
Anion gap: 13 (ref 5–15)
BUN: 17 mg/dL (ref 8–23)
CO2: 29 mmol/L (ref 22–32)
Calcium: 9.4 mg/dL (ref 8.9–10.3)
Chloride: 98 mmol/L (ref 98–111)
Creatinine, Ser: 0.98 mg/dL (ref 0.44–1.00)
GFR calc Af Amer: >60 mL/min (ref >60)
GFR calc non Af Amer: >60 mL/min (ref >60)
Glucose, Bld: 106 mg/dL — ABNORMAL HIGH (ref 70–99)
Potassium: 2.8 mmol/L — ABNORMAL LOW (ref 3.5–5.1)
Sodium: 140 mmol/L (ref 135–145)

## 2019-08-11 LAB — CBC WITH DIFFERENTIAL/PLATELET
Abs Immature Granulocytes: 0.01 10*3/uL (ref 0.00–0.07)
Basophils Absolute: 0 10*3/uL (ref 0.0–0.1)
Basophils Relative: 1 %
Eosinophils Absolute: 0.1 10*3/uL (ref 0.0–0.5)
Eosinophils Relative: 1 %
HCT: 41.8 % (ref 36.0–46.0)
Hemoglobin: 13.6 g/dL (ref 12.0–15.0)
Immature Granulocytes: 0 %
Lymphocytes Relative: 35 %
Lymphs Abs: 2.9 10*3/uL (ref 0.7–4.0)
MCH: 29.2 pg (ref 26.0–34.0)
MCHC: 32.5 g/dL (ref 30.0–36.0)
MCV: 89.9 fL (ref 80.0–100.0)
Monocytes Absolute: 0.5 10*3/uL (ref 0.1–1.0)
Monocytes Relative: 6 %
Neutro Abs: 4.7 10*3/uL (ref 1.7–7.7)
Neutrophils Relative %: 57 %
Platelets: 393 10*3/uL (ref 150–400)
RBC: 4.65 MIL/uL (ref 3.87–5.11)
RDW: 13.6 % (ref 11.5–15.5)
WBC: 8.2 10*3/uL (ref 4.0–10.5)
nRBC: 0 % (ref 0.0–0.2)

## 2019-08-11 LAB — CBG MONITORING, ED: Glucose-Capillary: 100 mg/dL — ABNORMAL HIGH (ref 70–99)

## 2019-08-11 MED ORDER — POTASSIUM CHLORIDE CRYS ER 20 MEQ PO TBCR
40.0000 meq | EXTENDED_RELEASE_TABLET | Freq: Once | ORAL | Status: AC
  Administered 2019-08-11: 40 meq via ORAL
  Filled 2019-08-11: qty 2

## 2019-08-11 MED ORDER — SODIUM CHLORIDE 0.9 % IV BOLUS
1000.0000 mL | Freq: Once | INTRAVENOUS | Status: AC
  Administered 2019-08-11: 1000 mL via INTRAVENOUS

## 2019-08-11 MED ORDER — POTASSIUM CHLORIDE 10 MEQ/100ML IV SOLN
10.0000 meq | Freq: Once | INTRAVENOUS | Status: AC
  Administered 2019-08-11: 10 meq via INTRAVENOUS
  Filled 2019-08-11: qty 100

## 2019-08-11 NOTE — Discharge Instructions (Addendum)
Your symptoms are likely due to dehydration.  Drink plenty of fluid.  Make sure to take your potassium supplementation. Eat a banana daily for 1 week to help replenish your potassium.  Stay quarantine for 2 weeks due to covid-19.  Return to the ER if you develop significant shortness of breath or if you have any concerns.

## 2019-08-11 NOTE — ED Notes (Signed)
Pt says dizziness is worse while laying down.

## 2019-08-11 NOTE — ED Provider Notes (Signed)
[facility] DEPT Provider Note   CSN: [id number] Arrival date & time: 08/11/19  1927     History   Chief Complaint Chief Complaint  Patient presents with  . Dizziness    HPI Alicia Martinez is a 63 y.o. female.     The history is provided by the patient and medical records. No language interpreter was used.  Dizziness    63 year old female with history of anemia, rectal cancer, hypertension, prior PE on Xarelto presenting for evaluation of dizziness.  Patient report one of her aquaintance recently test positive for COVID-19 this past week.  He has been in and out of the hospital.  She has been in close contact with him.  She decided to get tested for COVID-19 several days ago and subsequently test positive for COVID-19.  She however denies having any significant symptoms aside from dizziness for the past 2 days.  She described as more of a lightheadedness sensation, presents when she change position.  Described symptom is mild and states she does not have any other symptoms.  Specifically she denies having any fever chills, headache, runny nose sneezing or coughing, chest pain shortness of breath, focal numbness or focal weakness.  She denies any vision changes.  No significant treatment tried at home.  No nausea vomiting or diarrhea.  Past Medical History:  Diagnosis Date  . Allergy   . Anemia   . Arthritis    DJD, ARTHRITIS  . Cancer ([place])    rectal Ca  . Carotid artery plaque 08-30-13   PER CAROTID DOPPLER STUDY  . Deep vein thrombosis (HCC)    left lower extremity  . Diverticulitis large intestine   . Heart palpitations    HX OF PALPITATIONS AND SOME DIZZINESS - EVALUATED BY CARDIOLOGIST DR. ROSS - OFFICE NOTES IS EPIC 08/24/13 - "EVENT MONITOR SHOWED NO SIGNIFICANT ARRHYTHMIA"  . History of rectal cancer    T1N0 resected 08/2009  . Hypertension   . Infectious colitis   . Obesity   . Pain    LOWER BACK AND RT KNEE AND BOTH SHOULDERS  - LEFT SHOULDER PAIN WORSE--PT TOLD ARTHRITIS  . Pulmonary embolism (HCC)    LAST ONE WAS 1995- ON CHRONIC COUMADIN  . Sleep apnea 08/09/2012   wears CPAP    Patient Active Problem List   Diagnosis Date Noted  . Diabetes mellitus type 2, diet-controlled ([place]) 08/09/2017  . Onychomycosis of toenail 12/30/2015  . Left hip pain 11/03/2015  . Multiple thyroid nodules 11/03/2015  . Diverticulosis 10/30/2015  . 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 Osteoarthritis of multiple joints 12/31/2013  . Health care maintenance 10/31/2012  . Obstructive sleep apnea on CPAP 08/09/2012  . Adnexal mass 01/18/2011  . Long term current use of anticoagulants due to h/o PE and recurrent DVT with INR goal of 2.0-3.0 10/17/2010  . Hematuria 12/01/2009  . ADENOCARCINOMA, RECTUM 08/06/2009  . PERS HX MAL NEOPLSM RECT RECTOSIGMOID JUNC&ANUS 08/06/2009  . Obesity, Class [name], BMI 40-49.9 (morbid obesity) ([place]) 02/27/2009  . History of tobacco use disorder 02/27/2009  . Essential hypertension 02/27/2009    Past Surgical History:  Procedure Laterality Date  . ABDOMINAL HYSTERECTOMY    . APPENDECTOMY    . CECOSTOMY N/A 03/15/2014   Procedure: partial CECECTOMY;  Surgeon: Adin Hector, MD;  Location: [facility];  Service: General;  Laterality: N/A;  . COLONOSCOPY    . LAPAROSCOPIC APPENDECTOMY N/A 03/15/2014   Procedure: APPENDECTOMY  LAPAROSCOPIC;  Surgeon: Adin Hector, MD;  Location: [facility];  Service: General;  Laterality: N/A;  . LAPAROSCOPIC LYSIS OF ADHESIONS N/A 03/15/2014   Procedure: LAPAROSCOPIC LYSIS OF ADHESIONS;  Surgeon: Adin Hector, MD;  Location: [facility];  Service: General;  Laterality: N/A;  . TRANSANAL RECTAL RESECTION  08/2009   T1N0 rectal cancer Dr. Johney Maine  . TUBAL LIGATION       OB History   No obstetric history on file.      Home Medications    Prior to Admission medications   Medication Sig Start Date End Date Taking? Authorizing  Provider  capsicum (MUSCLE RELIEF) 0.075 % topical cream Apply 1 application topically 2 (two) times daily as needed (muscle pain).    [provider]  diphenhydrAMINE (BENADRYL) 25 MG tablet Take 25 mg by mouth every 4 (four) hours as needed (Patient felt like she was coming down with something and benadryl would help).     [provider]  furosemide (LASIX) 20 MG tablet Take 20 mg by mouth daily. 02/17/18   [provider]  hydrochlorothiazide (HYDRODIURIL) 25 MG tablet Take 1 tablet (25 mg total) by mouth daily. 04/18/17   Tawny Asal, MD  meclizine (ANTIVERT) 25 MG tablet Take 1 tablet (25 mg total) by mouth 3 (three) times daily as needed for dizziness. 12/08/17   Fredia Sorrow, MD  metFORMIN (GLUCOPHAGE) 500 MG tablet Take 1 tablet (500 mg total) by mouth 2 (two) times daily for 7 days. 06/16/18 06/23/18  Duffy Bruce, MD  ondansetron (ZOFRAN ODT) 4 MG disintegrating tablet Take 1 tablet (4 mg total) by mouth every 8 (eight) hours as needed for nausea or vomiting. 06/16/18   Duffy Bruce, MD  Oxycodone HCl 10 MG TABS Take 10 mg by mouth every 8 (eight) hours as needed for pain. 03/09/18   [provider]  potassium chloride (K-DUR) 10 MEQ tablet Take 1 tablet (10 mEq total) by mouth 2 (two) times daily. Patient taking differently: Take 10 mEq by mouth daily.  03/13/18   Jola Schmidt, MD  Psyllium (METAMUCIL PO) Take 1 packet by mouth daily.     [provider]  tetrahydrozoline 0.05 % ophthalmic solution Place 1 drop into both eyes as needed (allergies/red eyes). [place]    [provider]  XARELTO 20 MG TABS tablet TAKE 1 TABLET(20 MG) BY MOUTH DAILY WITH DINNER 03/11/18   Tawny Asal, MD    Family History Family History  Problem Relation Age of Onset  . Cancer Mother        unknown type but patient thinks it was bone marrow cancer  . Other Father        complications from severe burns  . Heart disease Sister   . Heart disease  Brother   . Clotting disorder Brother   . Colon cancer Neg Hx   . Stomach cancer Neg Hx   . Rectal cancer Neg Hx   . Esophageal cancer Neg Hx     Social History Social History   Tobacco Use  . Smoking status: Current Some Day Smoker    Packs/day: 0.30    Years: 40.00    Pack years: 12.00    Types: Cigarettes  . Smokeless tobacco: Never Used  Substance Use Topics  . Alcohol use: No    Alcohol/week: 0.0 standard drinks  . Drug use: No     Allergies   Other   Review of Systems Review of Systems  Neurological: Positive for  dizziness.  All other systems reviewed and are negative.    Physical Exam Updated Vital Signs BP (!) 171/89 (BP Location: Left Arm)   Pulse 96   Temp 98.8 F (37.1 C) (Oral)   Resp 18   Ht 5\' 6"  (1.676 m)   Wt 113.4 kg   SpO2 100%   BMI 40.35 kg/m   Physical Exam Vitals signs and nursing note reviewed.  Constitutional:      General: She is not in acute distress.    Appearance: She is well-developed.  HENT:     Head: Atraumatic.  Eyes:     Extraocular Movements: Extraocular movements intact.     Conjunctiva/sclera: Conjunctivae normal.     Pupils: Pupils are equal, round, and reactive to light.     Comments: No nystagmus  Neck:     Musculoskeletal: Neck supple.  Cardiovascular:     Rate and Rhythm: Normal rate and regular rhythm.     Pulses: Normal pulses.     Heart sounds: Normal heart sounds.  Pulmonary:     Effort: Pulmonary effort is normal.     Breath sounds: Normal breath sounds.  Abdominal:     Palpations: Abdomen is soft.     Tenderness: There is no abdominal tenderness.  Skin:    General: Skin is warm.     Findings: No rash.  Neurological:     Mental Status: She is alert and oriented to person, place, and time.  Psychiatric:        Mood and Affect: Mood normal.      ED Treatments / Results  Labs (all labs ordered are listed, but only abnormal results are displayed) Labs Reviewed  BASIC METABOLIC PANEL -  Abnormal; Notable for the following components:      Result Value   Potassium 2.8 (*)    Glucose, Bld 106 (*)    All other components within normal limits  CBG MONITORING, ED - Abnormal; Notable for the following components:   Glucose-Capillary 100 (*)    All other components within normal limits  CBC WITH DIFFERENTIAL/PLATELET  URINALYSIS, ROUTINE W REFLEX MICROSCOPIC    EKG None   Date: 08/11/2019  Rate: 81  Rhythm: normal sinus rhythm  QRS Axis: normal  Intervals: normal  ST/T Wave abnormalities: normal  Conduction Disutrbances: none  Narrative Interpretation:   Old EKG Reviewed: No significant changes noted     Radiology Dg Chest Portable 1 View  Result Date: 08/11/2019 CLINICAL DATA:  COVID-19 positivity with increased dizziness EXAM: PORTABLE CHEST 1 VIEW COMPARISON:  06/16/2018 FINDINGS: Cardiac shadows within normal limits. The lungs are well aerated bilaterally. No focal infiltrate or sizable effusion is seen. No bony abnormality is noted. IMPRESSION: No active disease. Electronically Signed   By: Inez Catalina M.D.   On: 08/11/2019 22:24    Procedures Procedures (including critical care time)  Medications Ordered in ED Medications  potassium chloride SA (KLOR-CON) CR tablet 40 mEq (has no administration in time range)  potassium chloride 10 mEq in 100 mL IVPB (has no administration in time range)  sodium chloride 0.9 % bolus 1,000 mL (1,000 mLs Intravenous New Bag/Given 08/11/19 2259)     Initial Impression / Assessment and Plan / ED Course  I have reviewed the triage vital signs and the nursing notes.  Pertinent labs & imaging results that were available during my care of the patient were reviewed by me and considered in my medical decision making (see chart for details).  BP (!) 142/71 (BP Location: Left Arm)   Pulse 79   Temp 98.8 F (37.1 C) (Oral)   Resp 14   Ht 5\' 6"  (1.676 m)   Wt 113.4 kg   SpO2 97%   BMI 40.35 kg/m    Final  Clinical Impressions(s) / ED Diagnoses   Final diagnoses:  Near syncope  Hypokalemia  COVID-19    ED Discharge Orders    None     9:52 PM Pt here with c/o dizziness/lightheadedness for the past 2-3 days.  Endorse recently diagnosed with COVID-19.  Pt is well appearing, no fever, no hypoxia.  Work up initiated, IVF given.   11:31 PM Chest x-ray is unremarkable.  No hypoxia.  Vital signs stable.  Mild decrease in blood pressure with orthostatic vital sign.  IV fluid given.  Patient without any focal neuro deficit on exam to suggest stroke.  She is likely dehydrated.  Potassium is 2.8, will replenish both with p.o. and IV.  Normal CBG.  Normal WBC.  BREYA PAM was evaluated in Emergency Department on 08/11/2019 for the symptoms described in the history of present illness. She was evaluated in the context of the global COVID-19 pandemic, which necessitated consideration that the patient might be at risk for infection with the SARS-CoV-2 virus that causes COVID-19. Institutional protocols and algorithms that pertain to the evaluation of patients at risk for COVID-19 are in a state of rapid change based on information released by regulatory bodies including the CDC and federal and state organizations. These policies and algorithms were followed during the patient's care in the ED.  11:43 PM Pt sign out to oncoming provider who will d/c pt pending IV hydration and potassium replenishment.     Domenic Moras, PA-C 08/11/19 2344    Virgel Manifold, MD 08/15/19 [phone]

## 2019-08-11 NOTE — ED Triage Notes (Signed)
Pt arrived stating she has had dizziness since Monday that worsens when she gets up in the morning. Reports testing positive for Covid-19 on the 4th. Denies any shortness of breath, pain, or NVD.

## 2019-08-11 NOTE — ED Notes (Signed)
Pt ambulated in room.  Pt says she feels fine when she walks slow but feels dizzy when walks fast.

## 2019-08-16 ENCOUNTER — Ambulatory Visit: Payer: 59

## 2019-09-03 ENCOUNTER — Other Ambulatory Visit: Payer: Self-pay

## 2019-09-03 ENCOUNTER — Ambulatory Visit
Admission: RE | Admit: 2019-09-03 | Discharge: 2019-09-03 | Disposition: A | Discharge status: Home / Self Care | Payer: 59 | Source: Ambulatory Visit | Attending: Specialist | Admitting: Specialist

## 2019-09-03 DIAGNOSIS — Z1231 Encounter for screening mammogram for malignant neoplasm of breast: Secondary | ICD-10-CM

## 2020-01-22 ENCOUNTER — Other Ambulatory Visit: Payer: Self-pay | Admitting: Specialist

## 2020-01-22 ENCOUNTER — Other Ambulatory Visit: Payer: Self-pay

## 2020-01-22 ENCOUNTER — Ambulatory Visit
Admission: RE | Admit: 2020-01-22 | Discharge: 2020-01-22 | Disposition: A | Discharge status: Home / Self Care | Payer: BC Managed Care – PPO | Source: Ambulatory Visit | Attending: Specialist | Admitting: Specialist

## 2020-01-22 DIAGNOSIS — R200 Anesthesia of skin: Secondary | ICD-10-CM

## 2020-01-22 DIAGNOSIS — M25511 Pain in right shoulder: Secondary | ICD-10-CM

## 2020-01-24 ENCOUNTER — Ambulatory Visit: Payer: BC Managed Care – PPO | Attending: Internal Medicine

## 2020-01-24 DIAGNOSIS — Z23 Encounter for immunization: Secondary | ICD-10-CM

## 2020-01-24 NOTE — Progress Notes (Signed)
   Covid-19 Vaccination Clinic  Name:  Alicia Martinez    MRN: [id number] DOB: 09-26-1956  01/24/2020  [name] was observed post Covid-19 immunization for 15 minutes without incident. She was provided with Vaccine Information Sheet and instruction to access the V-Safe system.   [name] was instructed to call 911 with any severe reactions post vaccine: Marland Kitchen Difficulty breathing  . Swelling of face and throat  . A fast heartbeat  . A bad rash all over body  . Dizziness and weakness   Immunizations Administered    Name Date Dose VIS Date Route   Pfizer COVID-19 Vaccine 01/24/2020 10:19 AM 0.3 mL 11/21/2018 Intramuscular   Manufacturer: [place]   Lot: [id number]   [place]: [id number]

## 2020-02-18 ENCOUNTER — Ambulatory Visit: Payer: BC Managed Care – PPO

## 2020-02-18 ENCOUNTER — Ambulatory Visit: Payer: BC Managed Care – PPO | Attending: Internal Medicine

## 2020-02-18 DIAGNOSIS — Z23 Encounter for immunization: Secondary | ICD-10-CM

## 2020-02-18 NOTE — Progress Notes (Signed)
   Covid-19 Vaccination Clinic  Name:  Alicia Martinez    MRN: [id number] DOB: May 14, 1956  02/18/2020  Alicia Martinez was observed post Covid-19 immunization for 15 minutes without incident. She was provided with Vaccine Information Sheet and instruction to access the V-Safe system.   Alicia Martinez was instructed to call 911 with any severe reactions post vaccine: Marland Kitchen Difficulty breathing  . Swelling of face and throat  . A fast heartbeat  . A bad rash all over body  . Dizziness and weakness   Immunizations Administered    Name Date Dose VIS Date Route   Pfizer COVID-19 Vaccine 02/18/2020 10:28 AM 0.3 mL 11/21/2018 Intramuscular   Manufacturer: [employer]   Lot: [id number]   [place]: [id number]

## 2020-11-13 ENCOUNTER — Other Ambulatory Visit: Payer: Self-pay | Admitting: Specialist

## 2020-11-13 DIAGNOSIS — Z1231 Encounter for screening mammogram for malignant neoplasm of breast: Secondary | ICD-10-CM

## 2020-11-17 ENCOUNTER — Ambulatory Visit (HOSPITAL_COMMUNITY)
Admission: EM | Admit: 2020-11-17 | Discharge: 2020-11-17 | Disposition: A | Discharge status: Home / Self Care | Payer: BC Managed Care – PPO | Attending: Family Medicine | Admitting: Family Medicine

## 2020-11-17 ENCOUNTER — Encounter (HOSPITAL_COMMUNITY): Payer: Self-pay | Admitting: Emergency Medicine

## 2020-11-17 ENCOUNTER — Other Ambulatory Visit: Payer: Self-pay

## 2020-11-17 DIAGNOSIS — B370 Candidal stomatitis: Secondary | ICD-10-CM

## 2020-11-17 DIAGNOSIS — R220 Localized swelling, mass and lump, head: Secondary | ICD-10-CM

## 2020-11-17 MED ORDER — NYSTATIN 100000 UNIT/ML MT SUSP: 500000.0000 [IU] | Freq: Four times a day (QID) | OROMUCOSAL | 0 refills | Status: DC

## 2020-11-17 MED ORDER — KETOROLAC TROMETHAMINE 30 MG/ML IJ SOLN
INTRAMUSCULAR | Status: AC
  Filled 2020-11-17: qty 1

## 2020-11-17 MED ORDER — KETOROLAC TROMETHAMINE 30 MG/ML IJ SOLN
30.0000 mg | Freq: Once | INTRAMUSCULAR | Status: AC
  Administered 2020-11-17: 30 mg via INTRAMUSCULAR

## 2020-11-17 MED ORDER — CLINDAMYCIN HCL 300 MG PO CAPS: 300.0000 mg | ORAL_CAPSULE | Freq: Two times a day (BID) | ORAL | 0 refills | Status: DC

## 2020-11-17 NOTE — Discharge Instructions (Addendum)
Try to stay hydrated. Go directly to the ER if your symptoms become worse or have not improved by tomorrow afternoon

## 2020-11-17 NOTE — ED Triage Notes (Signed)
Pt presents today with facial, throat and tongue swelling. Provider notified R.Lane and at bedside.

## 2020-11-21 NOTE — ED Provider Notes (Signed)
[facility]    CSN: [id number] Arrival date & time: 11/17/20  1801      History   Chief Complaint Chief Complaint  Patient presents with  . Facial Swelling    HPI Alicia Martinez is a 65 y.o. female.   Here today with about 4 days of progressively worsening right jaw/facial swelling, throat and tongue irritation and mild swelling, and pain in these areas. Denies recent sick contacts, congestion, known fever, CP, SOB, headache, visual changes, history of similar episodes. Does have known dental issues but not in this exact area so she does not feel that is related. States swelling becomes worse when she tries to eat so she has not been able to eat the past day or so. Trying OTC pain relievers with minimal relief. States she saw PCP a few days ago and was referred to ENT for eval of possibly thyroid issue due to new onset swelling, awaiting this consultation.      Past Medical History:  Diagnosis Date  . Allergy   . Anemia   . Arthritis    DJD, ARTHRITIS  . Cancer ([place])    rectal Ca  . Carotid artery plaque 08-30-13   PER CAROTID DOPPLER STUDY  . Deep vein thrombosis ([facility])    left lower extremity  . Diverticulitis large intestine   . Heart palpitations    HX OF PALPITATIONS AND SOME DIZZINESS - EVALUATED BY CARDIOLOGIST DR. ROSS - OFFICE NOTES IS EPIC 08/24/13 - "EVENT MONITOR SHOWED NO SIGNIFICANT ARRHYTHMIA"  . History of rectal cancer    T1N0 resected 08/2009  . Hypertension   . Infectious colitis   . Obesity   . Pain    LOWER BACK AND RT KNEE AND BOTH SHOULDERS - LEFT SHOULDER PAIN WORSE--PT TOLD ARTHRITIS  . Pulmonary embolism ([facility])    LAST ONE WAS 1995- ON CHRONIC COUMADIN  . Sleep apnea 08/09/2012   wears CPAP    Patient Active Problem List   Diagnosis Date Noted  . Diabetes mellitus type 2, diet-controlled ([place]) 08/09/2017  . Onychomycosis of toenail 12/30/2015  . Left hip pain 11/03/2015  . Multiple thyroid nodules 11/03/2015  .  Diverticulosis 10/30/2015  . 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 Osteoarthritis of multiple joints 12/31/2013  . Health care maintenance 10/31/2012  . Obstructive sleep apnea on CPAP 08/09/2012  . Adnexal mass 01/18/2011  . Long term current use of anticoagulants due to h/o PE and recurrent DVT with INR goal of 2.0-3.0 10/17/2010  . Hematuria 12/01/2009  . ADENOCARCINOMA, RECTUM 08/06/2009  . PERS HX MAL NEOPLSM RECT RECTOSIGMOID JUNC&ANUS 08/06/2009  . Obesity, Class [name], BMI 40-49.9 (morbid obesity) ([place]) 02/27/2009  . History of tobacco use disorder 02/27/2009  . Essential hypertension 02/27/2009    Past Surgical History:  Procedure Laterality Date  . ABDOMINAL HYSTERECTOMY    . APPENDECTOMY    . CECOSTOMY N/A 03/15/2014   Procedure: partial CECECTOMY;  Surgeon: Adin Hector, MD;  Location: [facility];  Service: General;  Laterality: N/A;  . COLONOSCOPY    . LAPAROSCOPIC APPENDECTOMY N/A 03/15/2014   Procedure: APPENDECTOMY LAPAROSCOPIC;  Surgeon: Adin Hector, MD;  Location: [facility];  Service: General;  Laterality: N/A;  . LAPAROSCOPIC LYSIS OF ADHESIONS N/A 03/15/2014   Procedure: LAPAROSCOPIC LYSIS OF ADHESIONS;  Surgeon: Adin Hector, MD;  Location: [facility];  Service: General;  Laterality: N/A;  . TRANSANAL RECTAL RESECTION  08/2009   T1N0 rectal cancer  Dr. Johney Maine  . TUBAL LIGATION      OB History   No obstetric history on file.      Home Medications    Prior to Admission medications   Medication Sig Start Date End Date Taking? Authorizing Provider  clindamycin (CLEOCIN) 300 MG capsule Take 1 capsule (300 mg total) by mouth in the morning and at bedtime. 11/17/20  Yes Volney American, PA-C  nystatin (MYCOSTATIN) 100000 UNIT/ML suspension Take 5 mLs (500,000 Units total) by mouth 4 (four) times daily. 11/17/20  Yes Volney American, PA-C  capsicum (MUSCLE RELIEF) 0.075 % topical cream Apply 1 application topically  2 (two) times daily as needed (muscle pain).    [provider]  diphenhydrAMINE (BENADRYL) 25 MG tablet Take 25 mg by mouth every 4 (four) hours as needed (Patient felt like she was coming down with something and benadryl would help).     [provider]  furosemide (LASIX) 20 MG tablet Take 20 mg by mouth daily. 02/17/18   [provider]  hydrochlorothiazide (HYDRODIURIL) 25 MG tablet Take 1 tablet (25 mg total) by mouth daily. 04/18/17   Tawny Asal, MD  meclizine (ANTIVERT) 25 MG tablet Take 1 tablet (25 mg total) by mouth 3 (three) times daily as needed for dizziness. 12/08/17   Fredia Sorrow, MD  metFORMIN (GLUCOPHAGE) 500 MG tablet Take 1 tablet (500 mg total) by mouth 2 (two) times daily for 7 days. 06/16/18 06/23/18  Duffy Bruce, MD  ondansetron (ZOFRAN ODT) 4 MG disintegrating tablet Take 1 tablet (4 mg total) by mouth every 8 (eight) hours as needed for nausea or vomiting. 06/16/18   Duffy Bruce, MD  Oxycodone HCl 10 MG TABS Take 10 mg by mouth every 8 (eight) hours as needed for pain. 03/09/18   [provider]  potassium chloride (K-DUR) 10 MEQ tablet Take 1 tablet (10 mEq total) by mouth 2 (two) times daily. Patient taking differently: Take 10 mEq by mouth daily.  03/13/18   Jola Schmidt, MD  Psyllium (METAMUCIL PO) Take 1 packet by mouth daily.     [provider]  tetrahydrozoline 0.05 % ophthalmic solution Place 1 drop into both eyes as needed (allergies/red eyes). [place]    [provider]  XARELTO 20 MG TABS tablet TAKE 1 TABLET(20 MG) BY MOUTH DAILY WITH DINNER 03/11/18   Tawny Asal, MD    Family History Family History  Problem Relation Age of Onset  . Cancer Mother        unknown type but patient thinks it was bone marrow cancer  . Other Father        complications from severe burns  . Heart disease Sister   . Heart disease Brother   . Clotting disorder Brother   . Colon cancer Neg Hx   . Stomach cancer Neg  Hx   . Rectal cancer Neg Hx   . Esophageal cancer Neg Hx     Social History Social History   Tobacco Use  . Smoking status: Current Some Day Smoker    Packs/day: 0.30    Years: 40.00    Pack years: 12.00    Types: Cigarettes  . Smokeless tobacco: Never Used  Vaping Use  . Vaping Use: Never used  Substance Use Topics  . Alcohol use: No    Alcohol/week: 0.0 standard drinks  . Drug use: No     Allergies   Other   Review of Systems Review of Systems PER HPI  Physical Exam Triage Vital Signs ED Triage Vitals  Enc Vitals Group     BP 11/17/20 1917 (!) 187/85     Pulse Rate 11/17/20 1917 (!) 101     Resp 11/17/20 1917 (!) 22     Temp 11/17/20 1917 99.4 F (37.4 C)     Temp Source 11/17/20 1917 Oral     SpO2 11/17/20 1917 98 %     Weight --      Height --      Head Circumference --      Peak Flow --      Pain Score 11/17/20 1943 9     Pain Loc --      Pain Edu? --      Excl. in [place]? --    No data found.  Updated Vital Signs BP (!) 187/85 (BP Location: Left Arm)   Pulse (!) 101   Temp 99.4 F (37.4 C) (Oral)   Resp (!) 22   SpO2 98%   Visual Acuity Right Eye Distance:   Left Eye Distance:   Bilateral Distance:    Right Eye Near:   Left Eye Near:    Bilateral Near:     Physical Exam Vitals and nursing note reviewed.  Constitutional:      General: She is not in acute distress.    Appearance: She is not toxic-appearing.     Comments: Appears in significant pain  HENT:     Head: Atraumatic.     Comments: Significant firm edema at right lower jaw down into upper portion of right lateral neck below, ttp    Right Ear: Tympanic membrane normal.     Left Ear: Tympanic membrane normal.     Nose: Nose normal.     Mouth/Throat:     Comments: Oral mucosa mildly dry, tongue mildly erythematous, edematous with white coating No obvious tonsillar edema, erythema, exudates Eyes:     Extraocular Movements: Extraocular movements intact.      Conjunctiva/sclera: Conjunctivae normal.  Neck:     Comments: No thyromegaly on palpation, symmetric motion with swallowing Cardiovascular:     Rate and Rhythm: Normal rate and regular rhythm.     Heart sounds: Normal heart sounds.  Pulmonary:     Effort: Pulmonary effort is normal. No respiratory distress.     Breath sounds: Normal breath sounds.  Musculoskeletal:        General: Normal range of motion.     Cervical back: Normal range of motion.  Skin:    General: Skin is warm and dry.  Neurological:     Mental Status: She is alert and oriented to person, place, and time.  Psychiatric:        Mood and Affect: Mood normal.        Thought Content: Thought content normal.        Judgment: Judgment normal.      UC Treatments / Results  Labs (all labs ordered are listed, but only abnormal results are displayed) Labs Reviewed - No data to display  EKG   Radiology No results found.  Procedures Procedures (including critical care time)  Medications Ordered in UC Medications  ketorolac (TORADOL) 30 MG/ML injection 30 mg (30 mg Intramuscular Given 11/17/20 1938)    Initial Impression / Assessment and Plan / UC Course  I have reviewed the triage vital signs and the nursing notes.  Pertinent labs & imaging results that were available during my care of the patient were reviewed by me  and considered in my medical decision making (see chart for details).     Mildly tachycardic, hypertensive in triage today likely secondary to pain from current condition. She is breathing comfortably on room air and declines going to ED for further evaluation and imaging for this issue at this time, but is agreeable to going to ED if worsening further or not improving on prescribed regimen. Suspect current issue is related to infected salivary stone, particularly given worsening of sxs with trying to eat. Possibly some thrush additionally though possibly appearance of tongue and oral mucosa related  to poor hydration. Will treat with clindamycin, nystatin gargle and IM toradol. Close PCP f/u, ENT as scheduled for consult, and ED if worsening or not improving in next 24 hours. She is agreeable to plan.   Final Clinical Impressions(s) / UC Diagnoses   Final diagnoses:  Right facial swelling  Thrush     Discharge Instructions     Try to stay hydrated. Go directly to the ER if your symptoms become worse or have not improved by tomorrow afternoon    ED Prescriptions    Medication Sig Dispense Auth. Provider   nystatin (MYCOSTATIN) 100000 UNIT/ML suspension Take 5 mLs (500,000 Units total) by mouth 4 (four) times daily. 60 mL Volney American, PA-C   clindamycin (CLEOCIN) 300 MG capsule Take 1 capsule (300 mg total) by mouth in the morning and at bedtime. 14 capsule Volney American, [place]     PDMP not reviewed this encounter.   Merrie Roof [place], [place] 11/21/20 [phone]

## 2020-12-11 ENCOUNTER — Encounter (INDEPENDENT_AMBULATORY_CARE_PROVIDER_SITE_OTHER): Payer: Self-pay | Admitting: Otolaryngology

## 2020-12-11 ENCOUNTER — Ambulatory Visit (INDEPENDENT_AMBULATORY_CARE_PROVIDER_SITE_OTHER): Payer: BC Managed Care – PPO | Admitting: Otolaryngology

## 2020-12-11 ENCOUNTER — Other Ambulatory Visit: Payer: Self-pay

## 2020-12-11 VITALS — Temp 97.5°F

## 2020-12-11 DIAGNOSIS — K115 Sialolithiasis: Secondary | ICD-10-CM

## 2020-12-11 NOTE — Progress Notes (Signed)
HPI: Alicia Martinez is a 65 y.o. female who presents is referred by York Ram, MD for evaluation of swelling of the right submandibular gland.  She has had this happen a couple of times in the past maybe once or twice a year where she developed swelling and pain of the right submandibular gland.  It last occurred about a month ago when she was treated with clindamycin.  The swelling went down and she has had no further swelling since that time.  She points to the region of the submandibular gland.. Of note she is on Xarelto because of history of blood clots.  She also has history of diabetes and hypertension.  Past Medical History:  Diagnosis Date  . Allergy   . Anemia   . Arthritis    DJD, ARTHRITIS  . Cancer ([place])    rectal Ca  . Carotid artery plaque 08-30-13   PER CAROTID DOPPLER STUDY  . Deep vein thrombosis (HCC)    left lower extremity  . Diverticulitis large intestine   . Heart palpitations    HX OF PALPITATIONS AND SOME DIZZINESS - EVALUATED BY CARDIOLOGIST DR. ROSS - OFFICE NOTES IS EPIC 08/24/13 - "EVENT MONITOR SHOWED NO SIGNIFICANT ARRHYTHMIA"  . History of rectal cancer    T1N0 resected 08/2009  . Hypertension   . Infectious colitis   . Obesity   . Pain    LOWER BACK AND RT KNEE AND BOTH SHOULDERS - LEFT SHOULDER PAIN WORSE--PT TOLD ARTHRITIS  . Pulmonary embolism (HCC)    LAST ONE WAS 1995- ON CHRONIC COUMADIN  . Sleep apnea 08/09/2012   wears CPAP   Past Surgical History:  Procedure Laterality Date  . ABDOMINAL HYSTERECTOMY    . APPENDECTOMY    . CECOSTOMY N/A 03/15/2014   Procedure: partial CECECTOMY;  Surgeon: Adin Hector, MD;  Location: [facility];  Service: General;  Laterality: N/A;  . COLONOSCOPY    . LAPAROSCOPIC APPENDECTOMY N/A 03/15/2014   Procedure: APPENDECTOMY LAPAROSCOPIC;  Surgeon: Adin Hector, MD;  Location: [facility];  Service: General;  Laterality: N/A;  . LAPAROSCOPIC LYSIS OF ADHESIONS N/A 03/15/2014   Procedure: LAPAROSCOPIC LYSIS  OF ADHESIONS;  Surgeon: Adin Hector, MD;  Location: [facility];  Service: General;  Laterality: N/A;  . TRANSANAL RECTAL RESECTION  08/2009   T1N0 rectal cancer Dr. Johney Maine  . TUBAL LIGATION     Social History   Socioeconomic History  . Marital status: Single    Spouse name: Not on file  . Number of children: 3  . Years of education: Not on file  . Highest education level: GED or equivalent  Occupational History    Employer: [employer]  . Occupation: MACHINE OPERATOR    Employer: [employer]  Tobacco Use  . Smoking status: Former Smoker    Packs/day: 0.30    Years: 40.00    Pack years: 12.00    Types: Cigarettes    Start date: 4    Quit date: 2020    Years since quitting: 2.2  . Smokeless tobacco: Never Used  Vaping Use  . Vaping Use: Never used  Substance and Sexual Activity  . Alcohol use: No    Alcohol/week: 0.0 standard drinks  . Drug use: No  . Sexual activity: Not on file  Other Topics Concern  . Not on file  Social History Narrative   Patient does not regular exercise. He has three boys. Daily caffeine use 2/day.      Patient  is right handed   Social Determinants of Health   Financial Resource Strain: Not on file  Food Insecurity: Not on file  Transportation Needs: Not on file  Physical Activity: Not on file  Stress: Not on file  Social Connections: Not on file   Family History  Problem Relation Age of Onset  . Cancer Mother        unknown type but patient thinks it was bone marrow cancer  . Other Father        complications from severe burns  . Heart disease Sister   . Heart disease Brother   . Clotting disorder Brother   . Colon cancer Neg Hx   . Stomach cancer Neg Hx   . Rectal cancer Neg Hx   . Esophageal cancer Neg Hx    Allergies  Allergen Reactions  . Other     SOME ADHESIVES CAUSE SKIN IRRITATION  - STATES PAPER TAPE OK.  STATES SOME EKG ELECTRODES AND SOME SKIN PATCHES ALSO CAUSE IRRITATION   Prior to Admission medications    Medication Sig Start Date End Date Taking? Authorizing Provider  capsicum (MUSCLE RELIEF) 0.075 % topical cream Apply 1 application topically 2 (two) times daily as needed (muscle pain).    [provider]  clindamycin (CLEOCIN) 300 MG capsule Take 1 capsule (300 mg total) by mouth in the morning and at bedtime. 11/17/20   Volney American, PA-C  diphenhydrAMINE (BENADRYL) 25 MG tablet Take 25 mg by mouth every 4 (four) hours as needed (Patient felt like she was coming down with something and benadryl would help).     [provider]  furosemide (LASIX) 20 MG tablet Take 20 mg by mouth daily. 02/17/18   [provider]  hydrochlorothiazide (HYDRODIURIL) 25 MG tablet Take 1 tablet (25 mg total) by mouth daily. 04/18/17   Tawny Asal, MD  meclizine (ANTIVERT) 25 MG tablet Take 1 tablet (25 mg total) by mouth 3 (three) times daily as needed for dizziness. 12/08/17   Fredia Sorrow, MD  metFORMIN (GLUCOPHAGE) 500 MG tablet Take 1 tablet (500 mg total) by mouth 2 (two) times daily for 7 days. 06/16/18 06/23/18  Duffy Bruce, MD  nystatin (MYCOSTATIN) 100000 UNIT/ML suspension Take 5 mLs (500,000 Units total) by mouth 4 (four) times daily. 11/17/20   Volney American, PA-C  ondansetron (ZOFRAN ODT) 4 MG disintegrating tablet Take 1 tablet (4 mg total) by mouth every 8 (eight) hours as needed for nausea or vomiting. 06/16/18   Duffy Bruce, MD  Oxycodone HCl 10 MG TABS Take 10 mg by mouth every 8 (eight) hours as needed for pain. 03/09/18   [provider]  potassium chloride (K-DUR) 10 MEQ tablet Take 1 tablet (10 mEq total) by mouth 2 (two) times daily. Patient taking differently: Take 10 mEq by mouth daily.  03/13/18   Jola Schmidt, MD  Psyllium (METAMUCIL PO) Take 1 packet by mouth daily.     [provider]  tetrahydrozoline 0.05 % ophthalmic solution Place 1 drop into both eyes as needed (allergies/red eyes). [place]    [provider]   XARELTO 20 MG TABS tablet TAKE 1 TABLET(20 MG) BY MOUTH DAILY WITH DINNER 03/11/18   Tawny Asal, MD     Positive ROS: Otherwise negative  All other systems have been reviewed and were otherwise negative with the exception of those mentioned in the HPI and as above.  Physical Exam: Constitutional: Alert, well-appearing, no acute distress Ears: External ears without lesions  or tenderness. Ear canals are clear bilaterally with intact, clear TMs.  Nasal: External nose without lesions. Septum with minimal deformity.. Clear nasal passages Oral: Lips and gums without lesions. Tongue and palate mucosa without lesions. Posterior oropharynx clear.  The drainage from the submandibular ducts is clear bilaterally.  On palpation of the floor mouth on the right side I can feel a stone or mass within the midportion of the right submandibular duct. Neck: No palpable adenopathy or masses.  Palpation of the submandibular gland the right gland is slightly larger than the left but no discrete masses within the gland.  It is not tender to palpation.  No palpable adenopathy around the gland. Respiratory: Breathing comfortably  Skin: No facial/neck lesions or rash noted.  Procedures  Assessment: Patient with a contralateral stone within the right submandibular duct which causes intermittent obstruction of the duct and gland.  Plan: Briefly reviewed the findings with the patient.  When she develops swelling of the gland would recommend drinking plenty of water or liquids to keep the saliva thin and if the swelling persists for more than 24 hours gave a prescription for antibiotics to take Keflex 500 mg 3 times daily for a week..  If she continues to have recurrent infections could consider removal of the submandibular gland.  She will follow-up as needed.  Presently the palpable stone is too proximal within the duct to remove in the office.   Radene Journey, MD   CC:

## 2020-12-12 ENCOUNTER — Emergency Department (HOSPITAL_COMMUNITY): Payer: BC Managed Care – PPO

## 2020-12-12 ENCOUNTER — Other Ambulatory Visit: Payer: Self-pay

## 2020-12-12 ENCOUNTER — Emergency Department (HOSPITAL_COMMUNITY)
Admission: EM | Admit: 2020-12-12 | Discharge: 2020-12-12 | Disposition: A | Discharge status: Home / Self Care | Payer: BC Managed Care – PPO | Attending: Emergency Medicine | Admitting: Emergency Medicine

## 2020-12-12 DIAGNOSIS — Z85040 Personal history of malignant carcinoid tumor of rectum: Secondary | ICD-10-CM | POA: Diagnosis not present

## 2020-12-12 DIAGNOSIS — E119 Type 2 diabetes mellitus without complications: Secondary | ICD-10-CM | POA: Diagnosis not present

## 2020-12-12 DIAGNOSIS — Z794 Long term (current) use of insulin: Secondary | ICD-10-CM | POA: Insufficient documentation

## 2020-12-12 DIAGNOSIS — I10 Essential (primary) hypertension: Secondary | ICD-10-CM | POA: Diagnosis not present

## 2020-12-12 DIAGNOSIS — R519 Headache, unspecified: Secondary | ICD-10-CM | POA: Diagnosis present

## 2020-12-12 DIAGNOSIS — Z7984 Long term (current) use of oral hypoglycemic drugs: Secondary | ICD-10-CM | POA: Diagnosis not present

## 2020-12-12 DIAGNOSIS — Z79899 Other long term (current) drug therapy: Secondary | ICD-10-CM | POA: Insufficient documentation

## 2020-12-12 DIAGNOSIS — R202 Paresthesia of skin: Secondary | ICD-10-CM | POA: Diagnosis not present

## 2020-12-12 DIAGNOSIS — Z87891 Personal history of nicotine dependence: Secondary | ICD-10-CM | POA: Insufficient documentation

## 2020-12-12 LAB — DIFFERENTIAL
Abs Immature Granulocytes: 0.03 10*3/uL (ref 0.00–0.07)
Basophils Absolute: 0 10*3/uL (ref 0.0–0.1)
Basophils Relative: 0 %
Eosinophils Absolute: 0.1 10*3/uL (ref 0.0–0.5)
Eosinophils Relative: 3 %
Immature Granulocytes: 1 %
Lymphocytes Relative: 34 %
Lymphs Abs: 1.8 10*3/uL (ref 0.7–4.0)
Monocytes Absolute: 0.3 10*3/uL (ref 0.1–1.0)
Monocytes Relative: 7 %
Neutro Abs: 2.9 10*3/uL (ref 1.7–7.7)
Neutrophils Relative %: 55 %

## 2020-12-12 LAB — CBC
HCT: 35.7 % — ABNORMAL LOW (ref 36.0–46.0)
Hemoglobin: 11.3 g/dL — ABNORMAL LOW (ref 12.0–15.0)
MCH: 28.3 pg (ref 26.0–34.0)
MCHC: 31.7 g/dL (ref 30.0–36.0)
MCV: 89.3 fL (ref 80.0–100.0)
Platelets: 299 10*3/uL (ref 150–400)
RBC: 4 MIL/uL (ref 3.87–5.11)
RDW: 15.3 % (ref 11.5–15.5)
WBC: 5.3 10*3/uL (ref 4.0–10.5)
nRBC: 0 % (ref 0.0–0.2)

## 2020-12-12 LAB — I-STAT CHEM 8, ED
BUN: 10 mg/dL (ref 8–23)
Calcium, Ion: 1.13 mmol/L — ABNORMAL LOW (ref 1.15–1.40)
Chloride: 104 mmol/L (ref 98–111)
Creatinine, Ser: 0.6 mg/dL (ref 0.44–1.00)
Glucose, Bld: 175 mg/dL — ABNORMAL HIGH (ref 70–99)
HCT: 34 % — ABNORMAL LOW (ref 36.0–46.0)
Hemoglobin: 11.6 g/dL — ABNORMAL LOW (ref 12.0–15.0)
Potassium: 3.7 mmol/L (ref 3.5–5.1)
Sodium: 140 mmol/L (ref 135–145)
TCO2: 26 mmol/L (ref 22–32)

## 2020-12-12 LAB — COMPREHENSIVE METABOLIC PANEL
ALT: 38 U/L (ref 0–44)
AST: 20 U/L (ref 15–41)
Albumin: 3.9 g/dL (ref 3.5–5.0)
Alkaline Phosphatase: 84 U/L (ref 38–126)
Anion gap: 9 (ref 5–15)
BUN: 13 mg/dL (ref 8–23)
CO2: 26 mmol/L (ref 22–32)
Calcium: 9.2 mg/dL (ref 8.9–10.3)
Chloride: 104 mmol/L (ref 98–111)
Creatinine, Ser: 0.66 mg/dL (ref 0.44–1.00)
GFR, Estimated: >60 mL/min (ref >60)
Glucose, Bld: 176 mg/dL — ABNORMAL HIGH (ref 70–99)
Potassium: 3.7 mmol/L (ref 3.5–5.1)
Sodium: 139 mmol/L (ref 135–145)
Total Bilirubin: 1 mg/dL (ref 0.3–1.2)
Total Protein: 7.4 g/dL (ref 6.5–8.1)

## 2020-12-12 LAB — PROTIME-INR
INR: 1.4 — ABNORMAL HIGH (ref 0.8–1.2)
Prothrombin Time: 16.3 seconds — ABNORMAL HIGH (ref 11.4–15.2)

## 2020-12-12 LAB — TROPONIN I (HIGH SENSITIVITY): Troponin I (High Sensitivity): 4 ng/L (ref <18)

## 2020-12-12 LAB — APTT: aPTT: 30 seconds (ref 24–36)

## 2020-12-12 MED ORDER — HYDROCHLOROTHIAZIDE 12.5 MG PO CAPS
12.5000 mg | ORAL_CAPSULE | Freq: Once | ORAL | Status: AC
  Administered 2020-12-12: 12.5 mg via ORAL
  Filled 2020-12-12: qty 1

## 2020-12-12 MED ORDER — METOCLOPRAMIDE HCL 5 MG/ML IJ SOLN: 10.0000 mg | Freq: Once | INTRAMUSCULAR | Status: DC

## 2020-12-12 MED ORDER — FUROSEMIDE 40 MG PO TABS
20.0000 mg | ORAL_TABLET | Freq: Once | ORAL | Status: AC
  Administered 2020-12-12: 20 mg via ORAL
  Filled 2020-12-12: qty 1

## 2020-12-12 MED ORDER — ACETAMINOPHEN 325 MG PO TABS
650.0000 mg | ORAL_TABLET | Freq: Once | ORAL | Status: AC
  Administered 2020-12-12: 650 mg via ORAL
  Filled 2020-12-12: qty 2

## 2020-12-12 MED ORDER — HYDROCHLOROTHIAZIDE 12.5 MG PO CAPS: 12.5000 mg | ORAL_CAPSULE | Freq: Once | ORAL | Status: DC

## 2020-12-12 NOTE — ED Notes (Signed)
Patient ambulated to room from triage

## 2020-12-12 NOTE — Discharge Instructions (Addendum)
You have high blood pressure which I suspect is causing the slight headache as well as tingling in arms.  I have given your normal dose of your blood pressure medications here please continue take them as prescribed at home.  I want you to monitor your blood pressure for the next couple days, if it continues to elevated please contact your PCP for further evaluation.  Come back to the emergency department if you develop chest pain, shortness of breath, severe abdominal pain, uncontrolled nausea, vomiting, diarrhea.

## 2020-12-12 NOTE — ED Provider Notes (Signed)
[place] DEPT Provider Note   CSN: [id number] Arrival date & time: 12/12/20  1332     History Chief Complaint  Patient presents with  . Headache  . Hypertension    COREENA Martinez is a 65 y.o. female.  HPI   Patient with significant medical history of hypertension, obesity, PE currently on Xarelto presents to the emergency department with chief complaint of high blood pressure.  She endorses that she went to her primary care office today and they noted  her blood pressure was elevated with complaints of headaches with paresthesias in her upper extremities, patient was sent her here for further evaluation.  Patient states  that she has been having a slight headache with slightly blurry vision, and numbness sensation in her arms for the last couple days.  She denies recent falls or head trauma.  Patient states  that she has been taking her medications as prescribed.  Patient denies alleviating factors.  Patient denies fevers, chills,  cough, chest pain, abdominal pain, nausea, vomiting, diarrhea, worsening pedal edema.  Past Medical History:  Diagnosis Date  . Allergy   . Anemia   . Arthritis    DJD, ARTHRITIS  . Cancer ([place])    rectal Ca  . Carotid artery plaque 08-30-13   PER CAROTID DOPPLER STUDY  . Deep vein thrombosis (HCC)    left lower extremity  . Diverticulitis large intestine   . Heart palpitations    HX OF PALPITATIONS AND SOME DIZZINESS - EVALUATED BY CARDIOLOGIST DR. ROSS - OFFICE NOTES IS EPIC 08/24/13 - "EVENT MONITOR SHOWED NO SIGNIFICANT ARRHYTHMIA"  . History of rectal cancer    T1N0 resected 08/2009  . Hypertension   . Infectious colitis   . Obesity   . Pain    LOWER BACK AND RT KNEE AND BOTH SHOULDERS - LEFT SHOULDER PAIN WORSE--PT TOLD ARTHRITIS  . Pulmonary embolism (HCC)    LAST ONE WAS 1995- ON CHRONIC COUMADIN  . Sleep apnea 08/09/2012   wears CPAP    Patient Active Problem List   Diagnosis Date Noted  .  Diabetes mellitus type 2, diet-controlled ([place]) 08/09/2017  . Onychomycosis of toenail 12/30/2015  . Left hip pain 11/03/2015  . Multiple thyroid nodules 11/03/2015  . Diverticulosis 10/30/2015  . 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 Osteoarthritis of multiple joints 12/31/2013  . Health care maintenance 10/31/2012  . Obstructive sleep apnea on CPAP 08/09/2012  . Adnexal mass 01/18/2011  . Long term current use of anticoagulants due to h/o PE and recurrent DVT with INR goal of 2.0-3.0 10/17/2010  . Hematuria 12/01/2009  . ADENOCARCINOMA, RECTUM 08/06/2009  . PERS HX MAL NEOPLSM RECT RECTOSIGMOID JUNC&ANUS 08/06/2009  . Obesity, Class [name], BMI 40-49.9 (morbid obesity) ([place]) 02/27/2009  . History of tobacco use disorder 02/27/2009  . Essential hypertension 02/27/2009    Past Surgical History:  Procedure Laterality Date  . ABDOMINAL HYSTERECTOMY    . APPENDECTOMY    . CECOSTOMY N/A 03/15/2014   Procedure: partial CECECTOMY;  Surgeon: Adin Hector, MD;  Location: [facility];  Service: General;  Laterality: N/A;  . COLONOSCOPY    . LAPAROSCOPIC APPENDECTOMY N/A 03/15/2014   Procedure: APPENDECTOMY LAPAROSCOPIC;  Surgeon: Adin Hector, MD;  Location: [facility];  Service: General;  Laterality: N/A;  . LAPAROSCOPIC LYSIS OF ADHESIONS N/A 03/15/2014   Procedure: LAPAROSCOPIC LYSIS OF ADHESIONS;  Surgeon: Adin Hector, MD;  Location: [facility];  Service: General;  Laterality: N/A;  . TRANSANAL RECTAL RESECTION  08/2009   T1N0 rectal cancer Dr. Johney Maine  . TUBAL LIGATION       OB History   No obstetric history on file.     Family History  Problem Relation Age of Onset  . Cancer Mother        unknown type but patient thinks it was bone marrow cancer  . Other Father        complications from severe burns  . Heart disease Sister   . Heart disease Brother   . Clotting disorder Brother   . Colon cancer Neg Hx   . Stomach cancer Neg Hx   . Rectal  cancer Neg Hx   . Esophageal cancer Neg Hx     Social History   Tobacco Use  . Smoking status: Former Smoker    Packs/day: 0.30    Years: 40.00    Pack years: 12.00    Types: Cigarettes    Start date: 52    Quit date: 2020    Years since quitting: 2.2  . Smokeless tobacco: Never Used  Vaping Use  . Vaping Use: Never used  Substance Use Topics  . Alcohol use: No    Alcohol/week: 0.0 standard drinks  . Drug use: No    Home Medications Prior to Admission medications   Medication Sig Start Date End Date Taking? Authorizing Provider  Ascorbic Acid (VITAMIN C) 1000 MG tablet Take 1,000 mg by mouth daily.   Yes [provider]  capsicum (ZOSTRIX) 0.075 % topical cream Apply 1 application topically 2 (two) times daily as needed (muscle pain).   Yes [provider]  cyclobenzaprine (FLEXERIL) 10 MG tablet Take 5-10 mg by mouth at bedtime. 12/12/20  Yes [provider]  diphenhydrAMINE (BENADRYL) 25 MG tablet Take 25 mg by mouth every 4 (four) hours as needed for itching or allergies.   Yes [provider]  Ferrous Gluconate (IRON 27 PO) Take 1 tablet by mouth daily.   Yes [provider]  fluticasone (FLONASE) 50 MCG/ACT nasal spray Place 1 spray into both nostrils daily as needed for allergies or rhinitis.   Yes [provider]  furosemide (LASIX) 20 MG tablet Take 20 mg by mouth daily. 02/17/18  Yes [provider]  hydrochlorothiazide (HYDRODIURIL) 25 MG tablet Take 1 tablet (25 mg total) by mouth daily. 04/18/17  Yes Tawny Asal, MD  Lactobacillus (PROBIOTIC ACIDOPHILUS PO) Take 1 capsule by mouth daily.   Yes [provider]  LEVEMIR FLEXTOUCH 100 UNIT/ML FlexPen Inject 10 Units into the skin at bedtime. 09/19/20  Yes [provider]  metFORMIN (GLUCOPHAGE) 500 MG tablet Take 1 tablet (500 mg total) by mouth 2 (two) times daily for 7 days. Patient taking differently: Take 1,000 mg by mouth daily with  breakfast. 06/16/18 06/23/18 Yes Duffy Bruce, MD  Multiple Vitamin (MULTIVITAMIN WITH MINERALS) TABS tablet Take 1 tablet by mouth daily.   Yes [provider]  NOVOLOG FLEXPEN 100 UNIT/ML FlexPen Inject 10 Units into the skin 3 (three) times daily with meals. 07/23/20  Yes [provider]  Oxycodone HCl 10 MG TABS Take 10 mg by mouth every 8 (eight) hours as needed for pain. 03/09/18  Yes [provider]  potassium chloride (K-DUR) 10 MEQ tablet Take 1 tablet (10 mEq total) by mouth 2 (two) times daily. Patient taking differently: Take 10 mEq by mouth daily. 03/13/18  Yes Jola Schmidt, MD  Psyllium (METAMUCIL PO) Take 1  packet by mouth daily.   Yes [provider]  tetrahydrozoline 0.05 % ophthalmic solution Place 1 drop into both eyes daily as needed (allergies/red eyes). [place]   Yes [provider]  XARELTO 20 MG TABS tablet TAKE 1 TABLET(20 MG) BY MOUTH DAILY WITH DINNER Patient taking differently: Take 20 mg by mouth at bedtime. 03/11/18  Yes Tawny Asal, MD  clindamycin (CLEOCIN) 300 MG capsule Take 1 capsule (300 mg total) by mouth in the morning and at bedtime. Patient not taking: No sig reported 11/17/20   Volney American, PA-C  meclizine (ANTIVERT) 25 MG tablet Take 1 tablet (25 mg total) by mouth 3 (three) times daily as needed for dizziness. Patient not taking: Reported on 12/12/2020 12/08/17   Fredia Sorrow, MD  nystatin (MYCOSTATIN) 100000 UNIT/ML suspension Take 5 mLs (500,000 Units total) by mouth 4 (four) times daily. Patient not taking: No sig reported 11/17/20   Volney American, PA-C  ondansetron (ZOFRAN ODT) 4 MG disintegrating tablet Take 1 tablet (4 mg total) by mouth every 8 (eight) hours as needed for nausea or vomiting. Patient not taking: No sig reported 06/16/18   Duffy Bruce, MD  valsartan (DIOVAN) 80 MG tablet Take 80 mg by mouth daily. 12/12/20   [provider]    Allergies    Other  Review  of Systems   Review of Systems  Constitutional: Negative for chills and fever.  HENT: Negative for congestion and sore throat.   Eyes: Positive for visual disturbance.  Respiratory: Negative for shortness of breath.   Cardiovascular: Negative for chest pain.  Gastrointestinal: Negative for abdominal pain, diarrhea, nausea and vomiting.  Genitourinary: Negative for enuresis.  Musculoskeletal: Negative for back pain.  Skin: Negative for rash.  Neurological: Positive for numbness and headaches. Negative for dizziness.  Hematological: Does not bruise/bleed easily.    Physical Exam Updated Vital Signs BP (!) 189/87   Pulse 69   Temp 97.9 F (36.6 C)   Resp 18   Ht 5\' 6"  (1.676 m)   Wt 115.2 kg   SpO2 94%   BMI 41.00 kg/m   Physical Exam Vitals and nursing note reviewed.  Constitutional:      General: She is not in acute distress.    Appearance: She is not ill-appearing.  HENT:     Head: Normocephalic and atraumatic.     Nose: No congestion.  Eyes:     Extraocular Movements: Extraocular movements intact.     Conjunctiva/sclera: Conjunctivae normal.     Pupils: Pupils are equal, round, and reactive to light.  Cardiovascular:     Rate and Rhythm: Normal rate and regular rhythm.     Pulses: Normal pulses.     Heart sounds: No murmur heard. No friction rub. No gallop.   Pulmonary:     Effort: No respiratory distress.     Breath sounds: No wheezing, rhonchi or rales.  Abdominal:     Palpations: Abdomen is soft.     Tenderness: There is no abdominal tenderness.  Musculoskeletal:     Cervical back: No rigidity.     Right lower leg: No edema.     Left lower leg: No edema.     Comments: Patient is moving all 4 extremities out difficulty.  Skin:    General: Skin is warm and dry.  Neurological:     Mental Status: She is alert.     GCS: GCS eye subscore is 4. GCS verbal subscore is 5. GCS motor subscore is  6.     Cranial Nerves: Cranial nerves are intact.     Sensory:  Sensation is intact.     Motor: No weakness.     Coordination: Romberg sign negative. Finger-Nose-Finger Test normal.     Comments: Cranial nerves II through XII are grossly intact  Patient have no difficulty with word finding.  Psychiatric:        Mood and Affect: Mood normal.     ED Results / Procedures / Treatments   Labs (all labs ordered are listed, but only abnormal results are displayed) Labs Reviewed  PROTIME-INR - Abnormal; Notable for the following components:      Result Value   Prothrombin Time 16.3 (*)    INR 1.4 (*)    All other components within normal limits  CBC - Abnormal; Notable for the following components:   Hemoglobin 11.3 (*)    HCT 35.7 (*)    All other components within normal limits  COMPREHENSIVE METABOLIC PANEL - Abnormal; Notable for the following components:   Glucose, Bld 176 (*)    All other components within normal limits  I-STAT CHEM 8, ED - Abnormal; Notable for the following components:   Glucose, Bld 175 (*)    Calcium, Ion 1.13 (*)    Hemoglobin 11.6 (*)    HCT 34.0 (*)    All other components within normal limits  APTT  DIFFERENTIAL  TROPONIN I (HIGH SENSITIVITY)  TROPONIN I (HIGH SENSITIVITY)    EKG EKG Interpretation  Date/Time:  Friday December 12 2020 13:51:33 EDT Ventricular Rate:  82 PR Interval:    QRS Duration: 96 QT Interval:  389 QTC Calculation: 455 R Axis:   42 Text Interpretation: Sinus rhythm Consider left atrial enlargement 12 Lead; [place] Confirmed by Quintella Reichert [phone]) on 12/12/2020 3:24:07 PM   Radiology CT HEAD WO CONTRAST  Result Date: 12/12/2020 CLINICAL DATA:  Headache. EXAM: CT HEAD WITHOUT CONTRAST TECHNIQUE: Contiguous axial images were obtained from the base of the skull through the vertex without intravenous contrast. COMPARISON:  12/08/2017 FINDINGS: Brain: The ventricles are in the midline without mass effect or shift. No extra-axial fluid collections are identified. The gray-white  differentiation is maintained. Stable dense dural calcifications. The brainstem and cerebellum are unremarkable. Vascular: Scattered vascular calcifications. No aneurysm hyperdense vessels. Skull: No skull fracture or bone lesions. Sinuses/Orbits: The paranasal sinuses and mastoid air cells are clear. The globes are intact. Other: No scalp lesions or scalp hematoma. IMPRESSION: No acute intracranial findings or mass lesions. Electronically Signed   By: Marijo Sanes M.D.   On: 12/12/2020 16:20   DG Chest Portable 1 View  Result Date: 12/12/2020 CLINICAL DATA:  Headache. EXAM: PORTABLE CHEST 1 VIEW COMPARISON:  08/11/2019 FINDINGS: The cardiac silhouette, mediastinal and hilar contours are within normal limits given the AP projection and portable technique. The lungs are clear. No pleural effusions. The bony thorax is intact. IMPRESSION: No acute cardiopulmonary findings. Electronically Signed   By: Marijo Sanes M.D.   On: 12/12/2020 16:16    Procedures Procedures   Medications Ordered in ED Medications  hydrochlorothiazide (MICROZIDE) capsule 12.5 mg (12.5 mg Oral Given 12/12/20 1615)  hydrochlorothiazide (MICROZIDE) capsule 12.5 mg (12.5 mg Oral Given 12/12/20 1629)  furosemide (LASIX) tablet 20 mg (20 mg Oral Given 12/12/20 1629)  acetaminophen (TYLENOL) tablet 650 mg (650 mg Oral Given 12/12/20 1648)    ED Course  I have reviewed the triage vital signs and the nursing notes.  Pertinent labs & imaging results  that were available during my care of the patient were reviewed by me and considered in my medical decision making (see chart for details).    MDM Rules/Calculators/A&P                          Initial impression-patient presents with elevated blood pressure.  She is alert, does not appear in acute distress, vital signs are before hypertension systolic of 580.  Will obtain basic lab work-up, EKG, chest x-ray, CT head and reassess.  We will also provide patient with her blood pressure  medications as she has not taken them.  Work-up-CBC shows normocytic anemia appears to be a baseline for patient, hemoglobin 7.3.  CMP shows slight elevation glucose 176, APTT 30, initial troponin is 4.  CT head negative for acute findings, chest x-ray negative, EKG sinus without signs of ischemia.  Patient was ambulated did not become hypoxic or tachycardic  Rule out- low suspicion for CVA or intracranial head bleed , no neuro deficits noted on exam, CT head did not reveal any acute findings.  Low suspicion for PE as patient is not hypoxic on ambulation, denies pleuritic chest pain, currently on anticoags, vital signs reassuring. low suspicion for ACS or arrhythmias as patient denies chest pain, EKG sinus without signs of ischemia, this troponin is 4.  Will defer second opponent as patient been chest pain-free for greater than 12 hours, would expect elevation if ACS was present.  Low suspicion for hypertensive emergency or urgency is there is no noted organ damage on exam within lab work.  Low suspicion for systemic infection as patient is nontoxic-appearing, vital signs reassuring, no obvious source infection noted on exam.    Plan-patient has elevated blood pressure suspect  secondary to noncompliance, started on her blood pressure medications have her follow-up with PCP for further evaluation.  Vital signs have remained stable, no indication for hospital admission.  Patient discussed with attending and they agreed with assessment and plan.  Patient given at home care as well strict return precautions.  Patient verbalized that they understood agreed to said plan.   Final Clinical Impression(s) / ED Diagnoses Final diagnoses:  Hypertension, unspecified type  Acute nonintractable headache, unspecified headache type    Rx / DC Orders ED Discharge Orders    None       Marcello Fennel, PA-C 12/12/20 1721    Quintella Reichert, MD 12/12/20 2109

## 2020-12-12 NOTE — ED Notes (Signed)
Ambulated pt in hallway and O2 dropped to 93% and then went back up to 96%.  Pt has abnormal gait due to limp but was steady.

## 2020-12-12 NOTE — ED Triage Notes (Signed)
Patient complains of headache and head pressure. Endorses mild nasal congestion, denies cough. Sent by PCP for HTN, was '187/something,' endorses mild SOB with exertion. Alert and oriented.

## 2021-01-01 ENCOUNTER — Ambulatory Visit: Payer: BC Managed Care – PPO

## 2021-01-02 ENCOUNTER — Ambulatory Visit
Admission: RE | Admit: 2021-01-02 | Discharge: 2021-01-02 | Disposition: A | Discharge status: Home / Self Care | Payer: BC Managed Care – PPO | Source: Ambulatory Visit | Attending: Specialist | Admitting: Specialist

## 2021-01-02 ENCOUNTER — Other Ambulatory Visit: Payer: Self-pay

## 2021-01-02 DIAGNOSIS — Z1231 Encounter for screening mammogram for malignant neoplasm of breast: Secondary | ICD-10-CM

## 2021-01-18 ENCOUNTER — Encounter: Payer: Self-pay | Admitting: Internal Medicine

## 2021-01-29 ENCOUNTER — Encounter: Payer: Self-pay | Admitting: Nurse Practitioner

## 2021-02-19 ENCOUNTER — Ambulatory Visit: Payer: BC Managed Care – PPO | Admitting: Nurse Practitioner

## 2021-03-25 ENCOUNTER — Ambulatory Visit: Payer: BC Managed Care – PPO | Admitting: Nurse Practitioner

## 2021-07-08 ENCOUNTER — Ambulatory Visit (INDEPENDENT_AMBULATORY_CARE_PROVIDER_SITE_OTHER): Payer: BC Managed Care – PPO | Admitting: Podiatry

## 2021-07-08 ENCOUNTER — Ambulatory Visit: Payer: BC Managed Care – PPO

## 2021-07-08 ENCOUNTER — Encounter: Payer: Self-pay | Admitting: Podiatry

## 2021-07-08 ENCOUNTER — Other Ambulatory Visit: Payer: Self-pay

## 2021-07-08 VITALS — BP 135/81 | Temp 98.7°F

## 2021-07-08 DIAGNOSIS — M79672 Pain in left foot: Secondary | ICD-10-CM

## 2021-07-08 DIAGNOSIS — E119 Type 2 diabetes mellitus without complications: Secondary | ICD-10-CM | POA: Diagnosis not present

## 2021-07-08 DIAGNOSIS — M79675 Pain in left toe(s): Secondary | ICD-10-CM | POA: Diagnosis not present

## 2021-07-08 DIAGNOSIS — M79671 Pain in right foot: Secondary | ICD-10-CM | POA: Diagnosis not present

## 2021-07-08 DIAGNOSIS — B351 Tinea unguium: Secondary | ICD-10-CM

## 2021-07-08 DIAGNOSIS — M79674 Pain in right toe(s): Secondary | ICD-10-CM | POA: Diagnosis not present

## 2021-07-08 NOTE — Progress Notes (Signed)
  Subjective:  Patient ID: Alicia Martinez, female    DOB: November 19, 1955,   MRN: [id number]  No chief complaint on file.   65 y.o. female presents for diabetic foot check and nail care. Relates her feet have been doing well except for her painful toenails. Relates pain that is burning in nature running up the back of her legs . Denies any other pedal complaints. Denies n/v/f/c.   Past Medical History:  Diagnosis Date   Allergy    Anemia    Arthritis    DJD, ARTHRITIS   Cancer (HCC)    rectal Ca   Carotid artery plaque 08-30-13   PER CAROTID DOPPLER STUDY   Deep vein thrombosis (HCC)    left lower extremity   Diverticulitis large intestine    Heart palpitations    HX OF PALPITATIONS AND SOME DIZZINESS - EVALUATED BY CARDIOLOGIST DR. ROSS - OFFICE NOTES IS EPIC 08/24/13 - "EVENT MONITOR SHOWED NO SIGNIFICANT ARRHYTHMIA"   History of rectal cancer    T1N0 resected 08/2009   Hypertension    Infectious colitis    Obesity    Pain    LOWER BACK AND RT KNEE AND BOTH SHOULDERS - LEFT SHOULDER PAIN WORSE--PT TOLD ARTHRITIS   Pulmonary embolism ([place])    LAST ONE WAS 1995- ON CHRONIC COUMADIN   Sleep apnea 08/09/2012   wears CPAP    Objective:  Physical Exam: Vascular: DP/PT pulses 2/4 bilateral. CFT <3 seconds. Normal hair growth on digits. No edema.  Skin. No lacerations or abrasions bilateral feet.  Musculoskeletal: MMT 5/5 bilateral lower extremities in DF, PF, Inversion and Eversion. Deceased ROM in DF of ankle joint.  Neurological: Sensation intact to light touch.   Assessment:   1. Foot pain, bilateral      Plan:  Patient was evaluated and treated and all questions answered. -Discussed and educated patient on diabetic foot care, especially with  regards to the vascular, neurological and musculoskeletal systems.  -Stressed the importance of good glycemic control and the detriment of not  controlling glucose levels in relation to the foot. -Discussed supportive shoes at  all times and checking feet regularly.  -Mechanically debrided all nails 1-5 bilateral using sterile nail nipper and filed with dremel without incident  -Recommend follow-up with PCP for possible sciatic type pain.  -Answered all patient questions -Patient to return  in 3 months for at risk foot care -Patient advised to call the office if any problems or questions arise in the meantime.   Lorenda Peck, DPM

## 2021-10-12 ENCOUNTER — Ambulatory Visit (INDEPENDENT_AMBULATORY_CARE_PROVIDER_SITE_OTHER): Payer: Self-pay | Admitting: Podiatry

## 2021-10-12 DIAGNOSIS — Z91199 Patient's noncompliance with other medical treatment and regimen due to unspecified reason: Secondary | ICD-10-CM

## 2021-10-12 NOTE — Progress Notes (Signed)
No show

## 2021-10-16 ENCOUNTER — Other Ambulatory Visit: Payer: Self-pay | Admitting: Specialist

## 2021-10-16 DIAGNOSIS — Z1231 Encounter for screening mammogram for malignant neoplasm of breast: Secondary | ICD-10-CM

## 2021-11-17 ENCOUNTER — Encounter: Payer: Self-pay | Admitting: Internal Medicine

## 2021-11-17 ENCOUNTER — Telehealth: Payer: Self-pay

## 2021-11-17 NOTE — Telephone Encounter (Signed)
NOTES SCANNED TO REFERRAL 

## 2021-12-06 NOTE — Progress Notes (Deleted)
?Cardiology Office Note:   ? ?Date:  12/06/2021  ? ?ID:  Alicia Martinez, DOB November 17, 1955, MRN [id number] ? ?PCP:  Alicia Junior, MD  ?Cardiologist:  None  ? ?Referring MD: Alicia Junior, MD  ? ?No chief complaint on file. ? ? ?History of Present Illness:   ? ?Alicia Martinez is a 66 y.o. female with a hx of  ? ? ?*** ? ?Past Medical History:  ?Diagnosis Date  ? Allergy   ? Anemia   ? Arthritis   ? DJD, ARTHRITIS  ? Cancer [facility])   ? rectal Ca  ? Carotid artery plaque 08-30-13  ? PER CAROTID DOPPLER STUDY  ? Deep vein thrombosis ([place])   ? left lower extremity  ? Diverticulitis large intestine   ? Heart palpitations   ? HX OF PALPITATIONS AND SOME DIZZINESS - EVALUATED BY CARDIOLOGIST Alicia Martinez - OFFICE NOTES IS EPIC 08/24/13 - "EVENT MONITOR SHOWED NO SIGNIFICANT ARRHYTHMIA"  ? History of rectal cancer   ? T1N0 resected 08/2009  ? Hypertension   ? Infectious colitis   ? Obesity   ? Pain   ? LOWER BACK AND RT KNEE AND BOTH SHOULDERS - LEFT SHOULDER PAIN WORSE--PT TOLD ARTHRITIS  ? Pulmonary embolism ([place])   ? LAST ONE WAS 1995- ON CHRONIC COUMADIN  ? Sleep apnea 08/09/2012  ? wears CPAP  ? ? ?Past Surgical History:  ?Procedure Laterality Date  ? ABDOMINAL HYSTERECTOMY    ? APPENDECTOMY    ? CECOSTOMY N/A 03/15/2014  ? Procedure: partial CECECTOMY;  Surgeon: Alicia Hector, MD;  Location: [facility];  Service: General;  Laterality: N/A;  ? COLONOSCOPY    ? LAPAROSCOPIC APPENDECTOMY N/A 03/15/2014  ? Procedure: APPENDECTOMY LAPAROSCOPIC;  Surgeon: Alicia Hector, MD;  Location: [facility];  Service: General;  Laterality: N/A;  ? LAPAROSCOPIC LYSIS OF ADHESIONS N/A 03/15/2014  ? Procedure: LAPAROSCOPIC LYSIS OF ADHESIONS;  Surgeon: Alicia Hector, MD;  Location: [facility];  Service: General;  Laterality: N/A;  ? TRANSANAL RECTAL RESECTION  08/2009  ? T1N0 rectal cancer Alicia Martinez  ? TUBAL LIGATION    ? ? ?Current Medications: ?No outpatient medications have been marked as taking for the 12/07/21 encounter (Appointment)  with Alicia Crome, MD.  ?  ? ?Allergies:   Other  ? ?Social History  ? ?Socioeconomic History  ? Marital status: Single  ?  Spouse name: Not on file  ? Number of children: 3  ? Years of education: Not on file  ? Highest education level: GED or equivalent  ?Occupational History  ?  Employer: [employer]  ? Occupation: MACHINE OPERATOR  ?  Employer: [employer]  ?Tobacco Use  ? Smoking status: Former  ?  Packs/day: 0.30  ?  Years: 40.00  ?  Pack years: 12.00  ?  Types: Cigarettes  ?  Start date: 17  ?  Quit date: 2020  ?  Years since quitting: 3.1  ? Smokeless tobacco: Never  ?Vaping Use  ? Vaping Use: Never used  ?Substance and Sexual Activity  ? Alcohol use: No  ?  Alcohol/week: 0.0 standard drinks  ? Drug use: No  ? Sexual activity: Not on file  ?Other Topics Concern  ? Not on file  ?Social History Narrative  ? Patient does not regular exercise. He has three boys. Daily caffeine use 2/day.  ?   ? Patient is right handed  ? ?Social Determinants of Health  ? ?Financial Resource Strain: Not  on file  ?Food Insecurity: Not on file  ?Transportation Needs: Not on file  ?Physical Activity: Not on file  ?Stress: Not on file  ?Social Connections: Not on file  ?  ? ?Family History: ?The patient's family history includes Cancer in her mother; Clotting disorder in her brother; Heart disease in her brother and sister; Other in her father. There is no history of Colon cancer, Stomach cancer, Rectal cancer, or Esophageal cancer. ? ?ROS:   ?Please see the history of present illness.    ?*** All other systems reviewed and are negative. ? ?EKGs/Labs/Other Studies Reviewed:   ? ?The following studies were reviewed today: ? ?CAROTID DOPPLER 2015 ?Intimal thickening, bilaterally. ?RICA has decreased from prior exam, now in the 1-39% range. ?Stable 0-99% LICA stenosis. ?Normal subclavian arteries, bilaterally. ?Patent vertebral arteries with antegrade flow. ?Abnormal appearance of right thyroid, as described above. Consider a  dedicated thyroid ultrasound. ? ?EKG:  EKG *** ? ?Recent Labs: ?12/12/2020: ALT 38; BUN 10; Creatinine, Ser 0.60; Hemoglobin 11.6; Platelets 299; Potassium 3.7; Sodium 140  ?Recent Lipid Panel ?   ?Component Value Date/Time  ? CHOL 135 04/18/2017 0949  ? TRIG 63 04/18/2017 0949  ? HDL 36 (L) 04/18/2017 0949  ? CHOLHDL 3.8 04/18/2017 0949  ? CHOLHDL 4.4 10/09/2013 1555  ? VLDL 20 10/09/2013 1555  ? [place] 86 04/18/2017 0949  ? ? ?Physical Exam:   ? ?VS:  There were no vitals taken for this visit.   ? ?Wt Readings from Last 3 Encounters:  ?12/12/20 254 lb (115.2 kg)  ?08/11/19 250 lb (113.4 kg)  ?06/16/18 250 lb (113.4 kg)  ?  ? ?GEN: ***. No acute distress ?HEENT: Normal ?NECK: No JVD. ?LYMPHATICS: No lymphadenopathy ?CARDIAC: *** murmur. RRR *** gallop, or edema. ?VASCULAR: *** Normal Pulses. No bruits. ?RESPIRATORY:  Clear to auscultation without rales, wheezing or rhonchi  ?ABDOMEN: Soft, non-tender, non-distended, No pulsatile mass, ?MUSCULOSKELETAL: No deformity  ?SKIN: Warm and dry ?NEUROLOGIC:  Alert and oriented x 3 ?PSYCHIATRIC:  Normal affect  ? ?ASSESSMENT:   ? ?1. Carotid artery disease, unspecified laterality, unspecified type ([place])   ?2. Obstructive sleep apnea on CPAP   ?3. History of tobacco use disorder   ?4. Obesity, Class [name], BMI 40-49.9 (morbid obesity) ([place])   ?5. Essential hypertension   ?6. Long term current use of anticoagulants due to h/o PE and recurrent DVT with INR goal of 2.0-3.0   ? ?PLAN:   ? ?In order of problems listed above: ? ?*** ? ? ?Medication Adjustments/Labs and Tests Ordered: ?Current medicines are reviewed at length with the patient today.  Concerns regarding medicines are outlined above.  ?No orders of the defined types were placed in this encounter. ? ?No orders of the defined types were placed in this encounter. ? ? ?There are no Patient Instructions on file for this visit.  ? ?Signed, ?Sinclair Grooms, MD  ?12/06/2021 3:42 PM    ?[place] ?

## 2021-12-07 ENCOUNTER — Ambulatory Visit: Payer: BC Managed Care – PPO | Admitting: Interventional Cardiology

## 2021-12-07 DIAGNOSIS — G4733 Obstructive sleep apnea (adult) (pediatric): Secondary | ICD-10-CM

## 2021-12-07 DIAGNOSIS — I779 Disorder of arteries and arterioles, unspecified: Secondary | ICD-10-CM

## 2021-12-07 DIAGNOSIS — I10 Essential (primary) hypertension: Secondary | ICD-10-CM

## 2021-12-07 DIAGNOSIS — Z7901 Long term (current) use of anticoagulants: Secondary | ICD-10-CM

## 2021-12-07 DIAGNOSIS — Z87891 Personal history of nicotine dependence: Secondary | ICD-10-CM

## 2022-01-04 ENCOUNTER — Ambulatory Visit: Payer: BC Managed Care – PPO

## 2022-01-06 ENCOUNTER — Ambulatory Visit (INDEPENDENT_AMBULATORY_CARE_PROVIDER_SITE_OTHER): Payer: 59 | Admitting: Podiatry

## 2022-01-06 ENCOUNTER — Encounter: Payer: Self-pay | Admitting: Podiatry

## 2022-01-06 DIAGNOSIS — M79674 Pain in right toe(s): Secondary | ICD-10-CM

## 2022-01-06 DIAGNOSIS — E119 Type 2 diabetes mellitus without complications: Secondary | ICD-10-CM

## 2022-01-06 DIAGNOSIS — M79675 Pain in left toe(s): Secondary | ICD-10-CM | POA: Diagnosis not present

## 2022-01-06 DIAGNOSIS — B351 Tinea unguium: Secondary | ICD-10-CM | POA: Diagnosis not present

## 2022-01-06 MED ORDER — MELOXICAM 15 MG PO TABS: 15.0000 mg | ORAL_TABLET | Freq: Every day | ORAL | 0 refills | Status: DC

## 2022-01-06 NOTE — Progress Notes (Signed)
?  Subjective:  ?Patient ID: Alicia Martinez, female    DOB: [birth year],   MRN: [id number] ? ?No chief complaint on file. ? ? ?66 y.o. female presents for diabetic foot check and nail care. Relates her feet have been doing well except for her painful toenails. Relates pain that is burning in nature running up the back of her legs Last A1c was 6.5. . Denies any other pedal complaints. Denies n/v/f/c.  ?PCP: York Ram.  ? ?Past Medical History:  ?Diagnosis Date  ? Allergy   ? Anemia   ? Arthritis   ? DJD, ARTHRITIS  ? Cancer [facility])   ? rectal Ca  ? Carotid artery plaque 08-30-13  ? PER CAROTID DOPPLER STUDY  ? Deep vein thrombosis ([place])   ? left lower extremity  ? Diverticulitis large intestine   ? Heart palpitations   ? HX OF PALPITATIONS AND SOME DIZZINESS - EVALUATED BY CARDIOLOGIST DR. ROSS - OFFICE NOTES IS EPIC 08/24/13 - "EVENT MONITOR SHOWED NO SIGNIFICANT ARRHYTHMIA"  ? History of rectal cancer   ? T1N0 resected 08/2009  ? Hypertension   ? Infectious colitis   ? Obesity   ? Pain   ? LOWER BACK AND RT KNEE AND BOTH SHOULDERS - LEFT SHOULDER PAIN WORSE--PT TOLD ARTHRITIS  ? Pulmonary embolism ([place])   ? LAST ONE WAS 1995- ON CHRONIC COUMADIN  ? Sleep apnea 08/09/2012  ? wears CPAP  ? ? ?Objective:  ?Physical Exam: ?Vascular: DP/PT pulses 2/4 bilateral. CFT <3 seconds. Normal hair growth on digits. No edema.  ?Skin. No lacerations or abrasions bilateral feet.  ?Musculoskeletal: MMT 5/5 bilateral lower extremities in DF, PF, Inversion and Eversion. Deceased ROM in DF of ankle joint.  ?Neurological: Sensation intact to light touch.  ? ?Assessment:  ? ?1. Pain due to onychomycosis of toenails of both feet   ?2. Diabetes mellitus type 2, diet-controlled ([place])   ? ? ? ?Plan:  ?Patient was evaluated and treated and all questions answered. ?-Discussed and educated patient on diabetic foot care, especially with  ?regards to the vascular, neurological and musculoskeletal systems.  ?-Stressed the importance of good  glycemic control and the detriment of not  ?controlling glucose levels in relation to the foot. ?-Discussed supportive shoes at all times and checking feet regularly.  ?-Mechanically debrided all nails 1-5 bilateral using sterile nail nipper and filed with dremel without incident  ?-Recommend shoes and inserts.   ?-Answered all patient questions ?-Patient to return  in 3 months for at risk foot care ?-Patient advised to call the office if any problems or questions arise in the meantime. ? ? ?Lorenda Peck, DPM  ? ? ?

## 2022-01-07 ENCOUNTER — Ambulatory Visit: Payer: Self-pay | Admitting: Internal Medicine

## 2022-01-19 ENCOUNTER — Ambulatory Visit: Payer: Self-pay | Admitting: Internal Medicine

## 2022-02-10 ENCOUNTER — Ambulatory Visit: Payer: Self-pay | Admitting: Physician Assistant

## 2022-02-11 ENCOUNTER — Ambulatory Visit (INDEPENDENT_AMBULATORY_CARE_PROVIDER_SITE_OTHER): Payer: 59 | Admitting: Nurse Practitioner

## 2022-02-11 ENCOUNTER — Encounter: Payer: Self-pay | Admitting: Nurse Practitioner

## 2022-02-11 ENCOUNTER — Telehealth: Payer: Self-pay

## 2022-02-11 VITALS — BP 130/70 | Ht 65.5 in | Wt 227.5 lb

## 2022-02-11 DIAGNOSIS — Z7901 Long term (current) use of anticoagulants: Secondary | ICD-10-CM

## 2022-02-11 DIAGNOSIS — C20 Malignant neoplasm of rectum: Secondary | ICD-10-CM

## 2022-02-11 DIAGNOSIS — Z86010 Personal history of colonic polyps: Secondary | ICD-10-CM

## 2022-02-11 NOTE — Patient Instructions (Signed)
You have been scheduled for a colonoscopy. Please follow written instructions given to you at your visit today.  Please pick up your prep supplies at the pharmacy within the next 1-3 days. If you use inhalers (even only as needed), please bring them with you on the day of your procedure.  You will be contacted by our office prior to your procedure for directions on holding your Xarelto.  If you do not hear from our office 1 week prior to your scheduled procedure, please call [phone] to discuss.   I appreciate the opportunity to care for you. Tye Savoy, NP-C

## 2022-02-11 NOTE — Telephone Encounter (Signed)
  Dear Dr York Ram:  We have scheduled the above named patient for a(n) colonoscopy procedure. Our records show that (s)he is on anticoagulation therapy.  Please advise as to whether the patient may come off their therapy of Xarelto the day before and the day of the  procedure which is scheduled for 02/23/2022.  Please route your response to Lynnel Zanetti [place], [place] or fax response to [phone]  Sincerely,    [facility]

## 2022-02-11 NOTE — Progress Notes (Signed)
Assessment   Patient profile:  Alicia Martinez is a 66 y.o. 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 See PMH below for any additional history.    History of rectal cancer T1N0 status post resection in 2010 / serrated   adenoma of appendiceal orifice s/p appendectomy and partial cecectomy in 2015, and history of adenomatous colon polyps. Overdue for surveillance colonoscopy.Marland Kitchen  History of DVT/PE, on Xarelto  Plan   Schedule for a colonoscopy. The risks and benefits of colonoscopy with possible polypectomy / biopsies were discussed and the patient agrees to proceed.   Hold Xarelto for 2 days before procedure - will instruct when and how to resume after procedure. Patient understands that there is a low but real risk of cardiovascular event such as heart attack, stroke, or embolism /  thrombosis while off blood thinner. The patient consents to proceed. Will communicate by phone or EMR with patient's prescribing provider to confirm that holding Xarelto is reasonable in this case.  History of Present Illness   Chief complaint: due for colonoscopy   Referred by PCP for colonoscopy  Alicia Martinez has a history of rectal cancer in 2010.  She also has a history of colon polyps.  Three dimunitive adenomas removed at time of last colonoscopy in Sept 2018.  A 3-year follow-up colonoscopy was recommended but not done (she didn't come back during COVID-19 pandemic)  We sent her a colonoscopy recall letter in April 2022  Alicia Martinez has no GI complaints.  Specifically, no bowel changes.  No blood in stool.  No abdominal pain.  Her weight is stable.  No general medical complaints.  She has mild normocytic anemia, on oral iron.    Previous Labs /  Imaging::    Latest Ref Rng & Units 12/12/2020    2:05 PM 12/12/2020    1:54 PM 08/11/2019    9:34 PM  CBC  WBC 4.0 - 10.5 K/uL  5.3   8.2    Hemoglobin 12.0 - 15.0 g/dL 11.6   11.3   13.6    Hematocrit 36.0 - 46.0 % 34.0   35.7   41.8    Platelets 150 - 400 K/uL  299   393      Lab Results  Component Value Date   LIPASE 18 04/10/2018      Latest Ref Rng & Units 12/12/2020    2:05 PM 12/12/2020    1:54 PM 08/11/2019    9:34 PM  CMP  Glucose 70 - 99 mg/dL 175   176   106    BUN 8 - 23 mg/dL '10   13   17    '$ Creatinine 0.44 - 1.00 mg/dL 0.60   0.66   0.98    Sodium 135 - 145 mmol/L 140   139   140    Potassium 3.5 - 5.1 mmol/L 3.7   3.7   2.8    Chloride 98 - 111 mmol/L 104   104   98    CO2 22 - 32 mmol/L  26   29    Calcium 8.9 - 10.3 mg/dL  9.2   9.4    Total Protein 6.5 - 8.1 g/dL  7.4     Total Bilirubin 0.3 - 1.2 mg/dL  1.0  Alkaline Phos 38 - 126 U/L  84     AST 15 - 41 U/L  20     ALT 0 - 44 U/L  38       Previous GI Evaluations   Endoscopies:  Most recent surveillance colonoscopy was 06/01/17 - Three diminutive polyps in the sigmoid colon and in the transverse colon, removed with a cold snare. Resected and retrieved. - Diverticulosis in the sigmoid colon. - Patent end-to-end colo-rectal anastomosis, characterized by healthy appearing mucosa. - The examination was otherwise normal on direct and retroflexion views. - Personal history of colonic polyps and rectal cancer  Path - diminutive adenomas.    Past Medical History:  Diagnosis Date   Allergy    Anemia    Arthritis    DJD, ARTHRITIS   Cancer (HCC)    rectal Ca   Carotid artery plaque 08-30-13   PER CAROTID DOPPLER STUDY   Deep vein thrombosis (HCC)    left lower extremity   Diverticulitis large intestine    Heart palpitations    HX OF PALPITATIONS AND SOME DIZZINESS - EVALUATED BY CARDIOLOGIST DR. ROSS - OFFICE NOTES IS EPIC 08/24/13 - "EVENT MONITOR SHOWED NO SIGNIFICANT ARRHYTHMIA"   History  of rectal cancer    T1N0 resected 08/2009   Hypertension    Infectious colitis    Obesity    Pain    LOWER BACK AND RT KNEE AND BOTH SHOULDERS - LEFT SHOULDER PAIN WORSE--PT TOLD ARTHRITIS   Pulmonary embolism (HCC)    LAST ONE WAS 1995- ON CHRONIC COUMADIN   Sleep apnea 08/09/2012   wears CPAP   Past Surgical History:  Procedure Laterality Date   ABDOMINAL HYSTERECTOMY     APPENDECTOMY     CECOSTOMY N/A 03/15/2014   Procedure: partial CECECTOMY;  Surgeon: Adin Hector, MD;  Location: [facility];  Service: General;  Laterality: N/A;   COLONOSCOPY     LAPAROSCOPIC APPENDECTOMY N/A 03/15/2014   Procedure: APPENDECTOMY LAPAROSCOPIC;  Surgeon: Adin Hector, MD;  Location: [facility];  Service: General;  Laterality: N/A;   LAPAROSCOPIC LYSIS OF ADHESIONS N/A 03/15/2014   Procedure: LAPAROSCOPIC LYSIS OF ADHESIONS;  Surgeon: Adin Hector, MD;  Location: [facility];  Service: General;  Laterality: N/A;   TRANSANAL RECTAL RESECTION  08/2009   T1N0 rectal cancer Dr. Johney Maine   TUBAL LIGATION     Family History  Problem Relation Age of Onset   Cancer Mother        unknown type but patient thinks it was bone marrow cancer   Other Father        complications from severe burns   Heart disease Sister    Heart disease Brother    Clotting disorder Brother    Colon cancer Neg Hx    Stomach cancer Neg Hx    Rectal cancer Neg Hx    Esophageal cancer Neg Hx    Social History   Tobacco Use   Smoking status: Former    Packs/day: 0.30    Years: 40.00    Pack years: 12.00    Types: Cigarettes    Start date: 78    Quit date: 2020    Years since quitting: 3.3   Smokeless tobacco: Never  Vaping Use   Vaping Use: Never used  Substance Use Topics   Alcohol use: No    Alcohol/week: 0.0 standard drinks   Drug use: No   Current Outpatient Medications  Medication Sig Dispense Refill   Ascorbic  Acid (VITAMIN C) 1000 MG tablet Take 1,000 mg by mouth daily.     capsicum (ZOSTRIX) 0.075 % topical  cream Apply 1 application topically 2 (two) times daily as needed (muscle pain).     clindamycin (CLEOCIN) 300 MG capsule Take 1 capsule (300 mg total) by mouth in the morning and at bedtime. (Patient not taking: No sig reported) 14 capsule 0   cyclobenzaprine (FLEXERIL) 10 MG tablet Take 5-10 mg by mouth at bedtime.     diphenhydrAMINE (BENADRYL) 25 MG tablet Take 25 mg by mouth every 4 (four) hours as needed for itching or allergies.     Ferrous Gluconate (IRON 27 PO) Take 1 tablet by mouth daily.     fluticasone (FLONASE) 50 MCG/ACT nasal spray Place 1 spray into both nostrils daily as needed for allergies or rhinitis.     furosemide (LASIX) 20 MG tablet Take 20 mg by mouth daily.  5   hydrochlorothiazide (HYDRODIURIL) 25 MG tablet Take 1 tablet (25 mg total) by mouth daily. 90 tablet 3   Lactobacillus (PROBIOTIC ACIDOPHILUS PO) Take 1 capsule by mouth daily.     LEVEMIR FLEXTOUCH 100 UNIT/ML FlexPen Inject 10 Units into the skin at bedtime.     meclizine (ANTIVERT) 25 MG tablet Take 1 tablet (25 mg total) by mouth 3 (three) times daily as needed for dizziness. (Patient not taking: Reported on 12/12/2020) 30 tablet 0   meloxicam (MOBIC) 15 MG tablet Take 1 tablet (15 mg total) by mouth daily. 30 tablet 0   metFORMIN (GLUCOPHAGE) 500 MG tablet Take 1 tablet (500 mg total) by mouth 2 (two) times daily for 7 days. (Patient taking differently: Take 1,000 mg by mouth daily with breakfast.) 14 tablet 0   Multiple Vitamin (MULTIVITAMIN WITH MINERALS) TABS tablet Take 1 tablet by mouth daily.     NOVOLOG FLEXPEN 100 UNIT/ML FlexPen Inject 10 Units into the skin 3 (three) times daily with meals.     nystatin (MYCOSTATIN) 100000 UNIT/ML suspension Take 5 mLs (500,000 Units total) by mouth 4 (four) times daily. (Patient not taking: No sig reported) 60 mL 0   ondansetron (ZOFRAN ODT) 4 MG disintegrating tablet Take 1 tablet (4 mg total) by mouth every 8 (eight) hours as needed for nausea or vomiting. (Patient  not taking: No sig reported) 12 tablet 0   Oxycodone HCl 10 MG TABS Take 10 mg by mouth every 8 (eight) hours as needed for pain.  0   potassium chloride (K-DUR) 10 MEQ tablet Take 1 tablet (10 mEq total) by mouth 2 (two) times daily. (Patient taking differently: Take 10 mEq by mouth daily.) 8 tablet 0   Psyllium (METAMUCIL PO) Take 1 packet by mouth daily.     tetrahydrozoline 0.05 % ophthalmic solution Place 1 drop into both eyes daily as needed (allergies/red eyes). VISINE     valsartan (DIOVAN) 80 MG tablet Take 80 mg by mouth daily.     XARELTO 20 MG TABS tablet TAKE 1 TABLET(20 MG) BY MOUTH DAILY WITH DINNER (Patient taking differently: Take 20 mg by mouth at bedtime.) 90 tablet 3   No current facility-administered medications for this visit.   Allergies  Allergen Reactions   Other     SOME ADHESIVES CAUSE SKIN IRRITATION  - STATES PAPER TAPE OK.  STATES SOME EKG ELECTRODES AND SOME SKIN PATCHES ALSO CAUSE IRRITATION     Review of Systems: Positive for arthritis, allergy, sinus trouble, vision changes, muscle pain and cramps.  All other systems  reviewed and negative except where noted in HPI.   Physical Exam   Wt Readings from Last 3 Encounters:  12/12/20 254 lb (115.2 kg)  08/11/19 250 lb (113.4 kg)  06/16/18 250 lb (113.4 kg)    BP 130/70   Pulse (P) 82   Ht 5' 5.5" (1.664 m) Comment: height measured without shoes  Wt 227 lb 8 oz (103.2 kg)   BMI 37.28 kg/m  Constitutional:  Generally well appearing female in no acute distress. Psychiatric: Pleasant. Normal mood and affect. Behavior is normal. EENT: Pupils normal.  Conjunctivae are normal. No scleral icterus. Neck supple.  Cardiovascular: Normal rate, regular rhythm. No edema Pulmonary/chest: Effort normal and breath sounds normal. No wheezing, rales or rhonchi. Abdominal: Soft, nondistended, nontender. Bowel sounds active throughout. There are no masses palpable. No hepatomegaly. Neurological: Alert and oriented to  person place and time. Skin: Skin is warm and dry. No rashes noted.  Tye Savoy, NP  02/11/2022, 11:35 AM  Cc:  Referring Provider Harvie Junior, MD

## 2022-02-12 ENCOUNTER — Encounter: Payer: Self-pay | Admitting: Nurse Practitioner

## 2022-02-15 NOTE — Telephone Encounter (Signed)
I spoke with Dr York Ram and he gave me verbal permission for her to hold her Xarelto the day before and the day of her colonoscopy. He faxed this over in writing. I called and left her a message to call me back so I can tell her.

## 2022-02-15 NOTE — Telephone Encounter (Signed)
Patient informed about her Xarelto and verbalized understanding.

## 2022-02-23 ENCOUNTER — Encounter: Payer: Self-pay | Admitting: Internal Medicine

## 2022-02-23 ENCOUNTER — Ambulatory Visit (AMBULATORY_SURGERY_CENTER): Payer: 59 | Admitting: Internal Medicine

## 2022-02-23 VITALS — BP 128/67 | HR 65 | Temp 97.8°F | Resp 14 | Ht 65.0 in | Wt 227.0 lb

## 2022-02-23 DIAGNOSIS — D125 Benign neoplasm of sigmoid colon: Secondary | ICD-10-CM | POA: Diagnosis not present

## 2022-02-23 DIAGNOSIS — Z85048 Personal history of other malignant neoplasm of rectum, rectosigmoid junction, and anus: Secondary | ICD-10-CM

## 2022-02-23 MED ORDER — SODIUM CHLORIDE 0.9 % IV SOLN: 500.0000 mL | INTRAVENOUS | Status: DC

## 2022-02-23 NOTE — Progress Notes (Signed)
Called to room to assist during endoscopic procedure.  Patient ID and intended procedure confirmed with present staff. Received instructions for my participation in the procedure from the performing physician.  

## 2022-02-23 NOTE — Op Note (Addendum)
[place] Patient Name: Alicia Martinez Procedure Date: 02/23/2022 2:12 PM MRN: [id number] Endoscopist: Gatha Mayer , MD Age: 66 Referring MD:  Date of Birth: 1956/03/09 Gender: Female Account #: [id number] Procedure:                Colonoscopy Indications:              High risk colon cancer surveillance: Personal                            history of rectal cancer, Last colonoscopy: 2018 Medicines:                Monitored Anesthesia Care Procedure:                Pre-Anesthesia Assessment:                           - Prior to the procedure, a History and Physical                            was performed, and patient medications and                            allergies were reviewed. The patient's tolerance of                            previous anesthesia was also reviewed. The risks                            and benefits of the procedure and the sedation                            options and risks were discussed with the patient.                            All questions were answered, and informed consent                            was obtained. Prior Anticoagulants: The patient                            last took Xarelto (rivaroxaban) 2 days prior to the                            procedure. ASA Grade Assessment: [name] - A patient                            with severe systemic disease. After reviewing the                            risks and benefits, the patient was deemed in                            satisfactory condition to undergo the procedure.  After obtaining informed consent, the colonoscope                            was passed under direct vision. Throughout the                            procedure, the patient's blood pressure, pulse, and                            oxygen saturations were monitored continuously. The                            Olympus CF-HQ190L (Serial# [id number]) Colonoscope was                             introduced through the anus and advanced to the the                            cecum, identified by appendiceal orifice and                            ileocecal valve. The colonoscopy was performed                            without difficulty. The patient tolerated the                            procedure well. The quality of the bowel                            preparation was adequate. The ileocecal valve,                            appendiceal orifice, and rectum were photographed.                            The bowel preparation used was Miralax via split                            dose instruction. Scope In: 2:18:06 PM Scope Out: 2:30:36 PM Scope Withdrawal Time: 0 hours 9 minutes 25 seconds  Total Procedure Duration: 0 hours 12 minutes 30 seconds  Findings:                 The perianal and digital rectal examinations were                            normal.                           A diminutive polyp was found in the sigmoid colon.                            The polyp was sessile. The polyp was removed with a  cold snare. Resection and retrieval were complete.                            Verification of patient identification for the                            specimen was done. Estimated blood loss was minimal.                           There was evidence of a prior end-to-end                            colo-colonic anastomosis in the rectum. This was                            patent and was characterized by healthy appearing                            mucosa.                           The exam was otherwise without abnormality. Complications:            No immediate complications. Estimated Blood Loss:     Estimated blood loss was minimal. Impression:               - One diminutive polyp in the sigmoid colon,                            removed with a cold snare. Resected and retrieved.                           - Patent end-to-end colo-colonic  anastomosis,                            characterized by healthy appearing mucosa.                           - The examination was otherwise normal. Rectal                            retroflexion not possible - post-op smaller rectal                            vault                           - Personal history of malignant rectal neoplasm.                            Resected 2010                           - Personal history of colonic polyps. asenomas Recommendation:           - Patient has a contact number available for  emergencies. The signs and symptoms of potential                            delayed complications were discussed with the                            patient. Return to normal activities tomorrow.                            Written discharge instructions were provided to the                            patient.                           - Resume previous diet.                           - Continue present medications.                           - Resume Xarelto (rivaroxaban) at prior dose                            tomorrow.                           - Await pathology results.                           - Repeat colonoscopy in 5 years for surveillance. Gatha Mayer, MD 02/23/2022 2:45:53 PM This report has been signed electronically.

## 2022-02-23 NOTE — Progress Notes (Signed)
Report to PACU, RN, vss, BBS= Clear.  

## 2022-02-23 NOTE — Patient Instructions (Addendum)
I found and removed one tiny polyp that looks benign/ I will let you know pathology results. Your next routine colonoscopy should be in 5 years - 2028.  I appreciate the opportunity to care for you.  Restart Xarelto tomorrow.  Read all of the handouts given to you by your recovery room nurse. Gatha Mayer, MD, [facility]  Read all of the handouts given to you by your recovery room nurse.  YOU HAD AN ENDOSCOPIC PROCEDURE TODAY AT [facility]:   Refer to the procedure report that was given to you for any specific questions about what was found during the examination.  If the procedure report does not answer your questions, please call your gastroenterologist to clarify.  If you requested that your care partner not be given the details of your procedure findings, then the procedure report has been included in a sealed envelope for you to review at your convenience later.  YOU SHOULD EXPECT: Some feelings of bloating in the abdomen. Passage of more gas than usual.  Walking can help get rid of the air that was put into your GI tract during the procedure and reduce the bloating. If you had a lower endoscopy (such as a colonoscopy or flexible sigmoidoscopy) you may notice spotting of blood in your stool or on the toilet paper. If you underwent a bowel prep for your procedure, you may not have a normal bowel movement for a few days.  Please Note:  You might notice some irritation and congestion in your nose or some drainage.  This is from the oxygen used during your procedure.  There is no need for concern and it should clear up in a day or so.  SYMPTOMS TO REPORT IMMEDIATELY:  Following lower endoscopy (colonoscopy or flexible sigmoidoscopy):  Excessive amounts of blood in the stool  Significant tenderness or worsening of abdominal pains  Swelling of the abdomen that is new, acute  Fever of 100F or higher   For urgent or emergent issues, a gastroenterologist can be reached at any  hour by calling [phone]. Do not use MyChart messaging for urgent concerns.    DIET:  We do recommend a small meal at first, but then you may proceed to your regular diet.  Drink plenty of fluids but you should avoid alcoholic beverages for 24 hours.  ACTIVITY:  You should plan to take it easy for the rest of today and you should NOT DRIVE or use heavy machinery until tomorrow (because of the sedation medicines used during the test).    FOLLOW UP: Our staff will call the number listed on your records 48-72 hours following your procedure to check on you and address any questions or concerns that you may have regarding the information given to you following your procedure. If we do not reach you, we will leave a message.  We will attempt to reach you two times.  During this call, we will ask if you have developed any symptoms of COVID 19. If you develop any symptoms (ie: fever, flu-like symptoms, shortness of breath, cough etc.) before then, please call [phone].  If you test positive for Covid 19 in the 2 weeks post procedure, please call and report this information to [place].    If any biopsies were taken you will be contacted by phone or by letter within the next 1-3 weeks.  Please call us at [phone] if you have not heard about the biopsies in 3 weeks.    SIGNATURES/CONFIDENTIALITY:  You and/or your care partner have signed paperwork which will be entered into your electronic medical record.  These signatures attest to the fact that that the information above on your After Visit Summary has been reviewed and is understood.  Full responsibility of the confidentiality of this discharge information lies with you and/or your care-partner.

## 2022-02-23 NOTE — Progress Notes (Signed)
History and Physical Interval Note:  02/23/2022 2:12 PM  Alicia Martinez  has presented today for endoscopic procedure(s), with the diagnosis of  Encounter Diagnosis  Name Primary?   Personal history of rectal cancer Yes  .  The various methods of evaluation and treatment have been discussed with the patient and/or family. After consideration of risks, benefits and other options for treatment, the patient has consented to  the endoscopic procedure(s).   The patient's history has been reviewed, patient examined, no change in status, stable for endoscopic procedure(s).  I have reviewed the patient's chart and labs.  Questions were answered to the patient's satisfaction.     Gatha Mayer, MD, [facility]

## 2022-02-24 ENCOUNTER — Telehealth: Payer: Self-pay | Admitting: *Deleted

## 2022-02-24 NOTE — Telephone Encounter (Signed)
Attempted to call patient for their post-procedure follow-up call. No answer. Left voicemail.   

## 2022-02-24 NOTE — Telephone Encounter (Signed)
Attempted to call patient for their post procedure follow up call. No answer.

## 2022-02-25 ENCOUNTER — Encounter: Payer: Self-pay | Admitting: Internal Medicine

## 2022-03-02 ENCOUNTER — Encounter: Payer: Self-pay | Admitting: Internal Medicine

## 2022-03-19 ENCOUNTER — Encounter: Payer: Self-pay | Admitting: Podiatry

## 2022-04-13 NOTE — Progress Notes (Addendum)
Office Visit    Patient Name: Alicia Martinez Date of Encounter: 04/13/2022  Primary Care Provider:  Harvie Junior, MD Primary Cardiologist:  None Primary Electrophysiologist: None  Chief Complaint    Alicia Martinez is a 66 y.o. 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  Past Medical History    Past Medical History:  Diagnosis Date   Allergy    Anemia    Arthritis    DJD, ARTHRITIS   Carotid artery plaque 08/30/2013   PER CAROTID DOPPLER STUDY   Colon polyps    Deep vein thrombosis (HCC)    left lower extremity   Diabetes (HCC)    Diverticulitis large intestine    Heart palpitations    HX OF PALPITATIONS AND SOME DIZZINESS - EVALUATED BY CARDIOLOGIST DR. ROSS - OFFICE NOTES IS EPIC 08/24/13 - "EVENT MONITOR SHOWED NO SIGNIFICANT ARRHYTHMIA"   History of rectal cancer    T1N0 resected 08/2009   Hypertension    Infectious colitis    Obesity    Pain    LOWER BACK AND RT KNEE AND BOTH SHOULDERS - LEFT SHOULDER PAIN WORSE--PT TOLD ARTHRITIS   Pulmonary embolism (HCC)    LAST ONE WAS 1995- ON CHRONIC COUMADIN   Rectal cancer ([place])    Sleep apnea 08/09/2012   wears CPAP   Past Surgical History:  Procedure Laterality Date   ABDOMINAL HYSTERECTOMY     CECOSTOMY N/A 03/15/2014   Procedure: partial CECECTOMY;  Surgeon: Adin Hector, MD;  Location: [facility];  Service: General;  Laterality: N/A;   COLONOSCOPY     LAPAROSCOPIC APPENDECTOMY N/A 03/15/2014   Procedure: APPENDECTOMY LAPAROSCOPIC;  Surgeon: Adin Hector, MD;  Location: [facility];  Service: General;  Laterality: N/A;   LAPAROSCOPIC LYSIS OF ADHESIONS N/A 03/15/2014   Procedure: LAPAROSCOPIC LYSIS OF ADHESIONS;  Surgeon: Adin Hector, MD;  Location: [facility];  Service: General;  Laterality: N/A;   TRANSANAL RECTAL RESECTION  08/2009    T1N0 rectal cancer Dr. Johney Maine   TUBAL LIGATION      Allergies  Allergies  Allergen Reactions   Asa [Aspirin]     REACTION: can not take due to blood thinner   Ibuprofen     Can't take due to taking blood thinners   Other     SOME ADHESIVES CAUSE SKIN IRRITATION  - STATES PAPER TAPE OK.  STATES SOME EKG ELECTRODES AND SOME SKIN PATCHES ALSO CAUSE IRRITATION    History of Present Illness    Alicia Martinez is a 66 year old female with the above-mentioned past medical history who presents today for follow-up of palpitations.  She was initially seen by Dr. Harrington Challenger in 2010 for presurgical evaluation.  She was then seen in 2014 for evaluation of palpitations were not associated with syncope, SOB, CP.  Event monitor ordered that showed no significant arrhythmias.  She was lost to follow-up and presents today for evaluation of right carotid artery stenosis  Patient has been lost to follow-up since 2015 when she was seen last by Dr Harrington Challenger.  Today she has a complaint of bilateral neck pain that she describes as burning that radiates from the neck up to the ears on both sides.  She has a history of carotid artery stenosis and has not been compliant with yearly ultrasounds.  She is a current smoker and  smokes 1/4 pack/day with 76 pack/year history.  Patient denies chest pain, palpitations, dyspnea, PND, orthopnea, nausea, vomiting, dizziness, syncope, edema, weight gain, or early satiety.  Her blood pressure today was 136/84 and heart rate was 82.    Home Medications    Current Outpatient Medications  Medication Sig Dispense Refill   Ascorbic Acid (VITAMIN C) 1000 MG tablet Take 1,000 mg by mouth daily. (Patient not taking: Reported on 02/23/2022)     cyclobenzaprine (FLEXERIL) 10 MG tablet Take 5-10 mg by mouth at bedtime.     diphenhydrAMINE (BENADRYL) 25 MG tablet Take 25 mg by mouth every 4 (four) hours as needed for itching or allergies.     Ferrous Gluconate (IRON 27 PO) Take 1 tablet by mouth  daily. (Patient not taking: Reported on 02/23/2022)     fluticasone (FLONASE) 50 MCG/ACT nasal spray Place 1 spray into both nostrils daily as needed for allergies or rhinitis.     furosemide (LASIX) 20 MG tablet Take 20 mg by mouth daily.  5   gabapentin (NEURONTIN) 100 MG capsule Take 100 mg by mouth 2 (two) times daily.     hydrochlorothiazide (HYDRODIURIL) 25 MG tablet Take 1 tablet (25 mg total) by mouth daily. 90 tablet 3   LEVEMIR FLEXTOUCH 100 UNIT/ML FlexPen Inject 10 Units into the skin at bedtime.     loratadine (CLARITIN) 10 MG tablet Take 10 mg by mouth daily.     meclizine (ANTIVERT) 25 MG tablet Take 1 tablet (25 mg total) by mouth 3 (three) times daily as needed for dizziness. 30 tablet 0   meloxicam (MOBIC) 15 MG tablet Take 1 tablet (15 mg total) by mouth daily. 30 tablet 0   metFORMIN (GLUCOPHAGE) 1000 MG tablet Take 1,000 mg by mouth 2 (two) times daily.     naloxone (NARCAN) nasal spray 4 mg/0.1 mL as needed. (Patient not taking: Reported on 02/11/2022)     NOVOLOG FLEXPEN 100 UNIT/ML FlexPen Inject 10 Units into the skin 3 (three) times daily with meals.     Oxycodone HCl 10 MG TABS Take 10 mg by mouth every 8 (eight) hours as needed for pain.  0   potassium chloride (K-DUR) 10 MEQ tablet Take 1 tablet (10 mEq total) by mouth 2 (two) times daily. (Patient taking differently: Take 10 mEq by mouth daily.) 8 tablet 0   Psyllium (METAMUCIL PO) Take 1 packet by mouth daily. (Patient not taking: Reported on 02/23/2022)     tetrahydrozoline 0.05 % ophthalmic solution Place 1 drop into both eyes daily as needed (allergies/red eyes). VISINE     valsartan (DIOVAN) 80 MG tablet Take 80 mg by mouth daily.     XARELTO 20 MG TABS tablet TAKE 1 TABLET(20 MG) BY MOUTH DAILY WITH DINNER (Patient taking differently: Take 20 mg by mouth at bedtime.) 90 tablet 3   No current facility-administered medications for this visit.     Review of Systems  Please see the history of present illness.     (+) Bilateral neck pain (+) Seasonal allergies  All other systems reviewed and are otherwise negative except as noted above.  Physical Exam    Wt Readings from Last 3 Encounters:  02/23/22 227 lb (103 kg)  02/11/22 227 lb 8 oz (103.2 kg)  12/12/20 254 lb (115.2 kg)   YD:XAJOI were no vitals filed for this visit.,There is no height or weight on file to calculate BMI.  Constitutional:      Appearance: Overweight. Not in distress.  Neck:     Vascular: JVD normal.  Pulmonary:     Effort: Pulmonary effort is normal.     Breath sounds: No wheezing. No rales. Diminished in the bases Cardiovascular:     Normal rate. Regular rhythm. Normal S1. Normal S2.      Murmurs: There is no murmur.  Edema:    Peripheral edema present bilaterally.  Abdominal:     Palpations: Abdomen is soft non tender. There is no hepatomegaly.  Skin:    General: Skin is warm and dry.  Neurological:     General: No focal deficit present.     Mental Status: Alert and oriented to person, place and time.     Cranial Nerves: Cranial nerves are intact.  EKG/LABS/Other Studies Reviewed    ECG personally reviewed by me today -sinus rhythm with possible left atrial enlargement with rate of 82 and T WI in lead aVL with no acute findings  Lab Results  Component Value Date   WBC 5.3 12/12/2020   HGB 11.6 (L) 12/12/2020   HCT 34.0 (L) 12/12/2020   MCV 89.3 12/12/2020   PLT 299 12/12/2020   Lab Results  Component Value Date   CREATININE 0.60 12/12/2020   BUN 10 12/12/2020   NA 140 12/12/2020   K 3.7 12/12/2020   CL 104 12/12/2020   CO2 26 12/12/2020   Lab Results  Component Value Date   ALT 38 12/12/2020   AST 20 12/12/2020   ALKPHOS 84 12/12/2020   BILITOT 1.0 12/12/2020   Lab Results  Component Value Date   CHOL 135 04/18/2017   HDL 36 (L) 04/18/2017   LDLCALC 86 04/18/2017   TRIG 63 04/18/2017   CHOLHDL 3.8 04/18/2017    Lab Results  Component Value Date   HGBA1C 6.5 11/08/2017     Assessment & Plan    1.  Carotid artery stenosis/PAD: -Carotid ultrasound for surveillance of carotid stenosis Patient is currently not on statin therapy for history of carotid artery disease and is on Xarelto for chronic DVT therefore ASA is contraindicated. -We will check lipid panel today and CMET today with plan to initiate atorvastatin 20 mg daily  2.  Hypertension: -Patient's blood pressure today was 136/84 -Continue HCTZ 25 mg, and valsartan 80 mg daily -Discussed importance of abstaining from excess sodium in her diet  3.  History of palpitations: -Currently resolved -Patient reported that she has not experienced any palpitations similar to her previous complaint.   4.  Hyperlipidemia: -Patient's last lipid panel was completed in 2014 -We will initiate statin therapy with atorvastatin 20 mg pending lipid panel and CMET results as noted above -Patient encouraged to follow low-sodium heart healthy diet -She was also encouraged to increase physical activity 150 minutes  5.  History of chronic DVT: -Patient denies any bleeding issues currently on chronic anticoagulation with Xarelto. -We will check BMET to verify creatinine clearance and correct dosage of Xarelto -Continue Xarelto 20 mg daily  6.  Tobacco abuse: -Patient has 4 pack/year and is currently smoking quarter pack per day -We will have her start NicoDerm patches -She was advised to contact 1 [phone] quit now to initiate an official stop date. -We will order noncontrast CT to evaluate for lung cancer on next visit  Disposition: Follow-up with None or APP in 1 months    Medication Adjustments/Labs and Tests Ordered: Current medicines are reviewed at length with the patient today.  Concerns regarding medicines are outlined above.   Signed, Mable Fill,  Marissa Nestle, NP 04/13/2022, 5:00 PM [place]

## 2022-04-14 ENCOUNTER — Ambulatory Visit: Payer: 59 | Admitting: Podiatry

## 2022-04-14 ENCOUNTER — Encounter: Payer: Self-pay | Admitting: Nurse Practitioner

## 2022-04-14 ENCOUNTER — Ambulatory Visit (INDEPENDENT_AMBULATORY_CARE_PROVIDER_SITE_OTHER): Payer: Medicare Other | Admitting: Nurse Practitioner

## 2022-04-14 VITALS — BP 136/84 | HR 82 | Ht 65.0 in | Wt 237.6 lb

## 2022-04-14 DIAGNOSIS — I739 Peripheral vascular disease, unspecified: Secondary | ICD-10-CM

## 2022-04-14 DIAGNOSIS — R0989 Other specified symptoms and signs involving the circulatory and respiratory systems: Secondary | ICD-10-CM | POA: Diagnosis not present

## 2022-04-14 DIAGNOSIS — I10 Essential (primary) hypertension: Secondary | ICD-10-CM | POA: Diagnosis not present

## 2022-04-14 DIAGNOSIS — R0600 Dyspnea, unspecified: Secondary | ICD-10-CM

## 2022-04-14 DIAGNOSIS — Z87891 Personal history of nicotine dependence: Secondary | ICD-10-CM

## 2022-04-14 MED ORDER — NICOTINE 14 MG/24HR TD PT24: 14.0000 mg | MEDICATED_PATCH | Freq: Every day | TRANSDERMAL | 0 refills | Status: DC

## 2022-04-14 NOTE — Patient Instructions (Addendum)
Medication Instructions:  NICOTINE PATCHES AS DIRECTED *If you need a refill on your cardiac medications before your next appointment, please call your pharmacy*   Lab Work: TODAY CMET CBC TSH AND LIPID If you have labs (blood work) drawn today and your tests are completely normal, you will receive your results only by: [place] (if you have MyChart) OR A paper copy in the mail If you have any lab test that is abnormal or we need to change your treatment, we will call you to review the results.   Testing/Procedures: Your physician has requested that you have a carotid duplex. This test is an ultrasound of the carotid arteries in your neck. It looks at blood flow through these arteries that supply the brain with blood. Allow one hour for this exam. There are no restrictions or special instructions.   Your physician has requested that you have an echocardiogram. Echocardiography is a painless test that uses sound waves to create images of your heart. It provides your doctor with information about the size and shape of your heart and how well your heart's chambers and valves are working. This procedure takes approximately one hour. There are no restrictions for this procedure.    Follow-Up: At [facility], you and your health needs are our priority.  As part of our continuing mission to provide you with exceptional heart care, we have created designated Provider Care Teams.  These Care Teams include your primary Cardiologist (physician) and Advanced Practice Providers (APPs -  Physician Assistants and Nurse Practitioners) who all work together to provide you with the care you need, when you need it.  We recommend signing up for the patient portal called "MyChart".  Sign up information is provided on this After Visit Summary.  MyChart is used to connect with patients for Virtual Visits (Telemedicine).  Patients are able to view lab/test results, encounter notes, upcoming appointments,  etc.  Non-urgent messages can be sent to your provider as well.   To learn more about what you can do with MyChart, go to [email or web address].    Your next appointment:   1 month(s)  The format for your next appointment:   In Person  Provider:  Ambrose Pancoast NP     Other Instructions [phone]  Important Information About Sugar

## 2022-04-15 LAB — COMPREHENSIVE METABOLIC PANEL
ALT: 22 IU/L (ref 0–32)
AST: 12 IU/L (ref 0–40)
Albumin/Globulin Ratio: 1.5 (ref 1.2–2.2)
Albumin: 4 g/dL (ref 3.9–4.9)
Alkaline Phosphatase: 100 IU/L (ref 44–121)
BUN/Creatinine Ratio: 19 (ref 12–28)
BUN: 16 mg/dL (ref 8–27)
Bilirubin Total: 0.4 mg/dL (ref 0.0–1.2)
CO2: 28 mmol/L (ref 20–29)
Calcium: 9.6 mg/dL (ref 8.7–10.3)
Chloride: 96 mmol/L (ref 96–106)
Creatinine, Ser: 0.83 mg/dL (ref 0.57–1.00)
Globulin, Total: 2.7 g/dL (ref 1.5–4.5)
Glucose: 282 mg/dL — ABNORMAL HIGH (ref 70–99)
Potassium: 3.8 mmol/L (ref 3.5–5.2)
Sodium: 138 mmol/L (ref 134–144)
Total Protein: 6.7 g/dL (ref 6.0–8.5)
eGFR: 79 mL/min/{1.73_m2} (ref >59)

## 2022-04-15 LAB — LIPID PANEL
Chol/HDL Ratio: 4.2 ratio (ref 0.0–4.4)
Cholesterol, Total: 142 mg/dL (ref 100–199)
HDL: 34 mg/dL — ABNORMAL LOW (ref >39)
LDL Chol Calc (NIH): 91 mg/dL (ref 0–99)
Triglycerides: 90 mg/dL (ref 0–149)
VLDL Cholesterol Cal: 17 mg/dL (ref 5–40)

## 2022-04-15 LAB — CBC
Hematocrit: 33.3 % — ABNORMAL LOW (ref 34.0–46.6)
Hemoglobin: 11.5 g/dL (ref 11.1–15.9)
MCH: 30.6 pg (ref 26.6–33.0)
MCHC: 34.5 g/dL (ref 31.5–35.7)
MCV: 89 fL (ref 79–97)
Platelets: 300 10*3/uL (ref 150–450)
RBC: 3.76 x10E6/uL — ABNORMAL LOW (ref 3.77–5.28)
RDW: 13.2 % (ref 11.7–15.4)
WBC: 4.7 10*3/uL (ref 3.4–10.8)

## 2022-04-15 LAB — TSH: TSH: 1.19 u[IU]/mL (ref 0.450–4.500)

## 2022-04-21 ENCOUNTER — Telehealth: Payer: Self-pay | Admitting: Internal Medicine

## 2022-04-21 ENCOUNTER — Telehealth: Payer: Self-pay

## 2022-04-21 DIAGNOSIS — Z79899 Other long term (current) drug therapy: Secondary | ICD-10-CM

## 2022-04-21 MED ORDER — ATORVASTATIN CALCIUM 20 MG PO TABS: 20.0000 mg | ORAL_TABLET | Freq: Every day | ORAL | 3 refills | Status: DC

## 2022-04-21 NOTE — Telephone Encounter (Signed)
Third attempt to reach patient with lab results. DPR on file has that we can leave detailed message on mobile number. Mobile number on DPR is different than what's currently listed in computer. Number on DPR says not in service. Mobile number on file confirms that it's Alicia Martinez. Details left on current mobile number voice mail. Atorvastatin sent to pharmacy on file and repeat labs placed and scheduled for 6 weeks.

## 2022-04-21 NOTE — Telephone Encounter (Signed)
-----   Message from Marylu Lund., NP sent at 04/15/2022  7:22 AM EDT ----- Please let Alicia Martinez know that her renal function and liver function were both normal.  Her blood sugar was very high yesterday in the fasting state and she should follow-up with her PCP regarding her current diabetic regimen.  We will initiate statin therapy with atorvastatin 20 mg to achieve LDL less than 70.  Her HDL cholesterol which are considered the good cholesterol are currently low.  She can naturally increase her HDL's  by not smoking, and eating a diet rich in omega-3 fatty acids which can be found in salmon and olive oil.  She can also increase her physical activity to help aid in increasing her HDL.  We will also have her come in for lipids and LFT check in 6 weeks.  Please let me know if you have any questions.

## 2022-04-21 NOTE — Telephone Encounter (Signed)
Encounter notes route to PCP York Ram per office request.

## 2022-04-21 NOTE — Telephone Encounter (Signed)
Office calling to get a copy of patient appt on 7/19. Please advise

## 2022-04-26 NOTE — Telephone Encounter (Signed)
Office notes faxed to PCP per request.

## 2022-04-26 NOTE — Telephone Encounter (Signed)
   Starla with Dr. Jimmye Norman office requesting if the office notes can be refax to them. He gave fax# [phone]

## 2022-04-28 ENCOUNTER — Ambulatory Visit (HOSPITAL_COMMUNITY)
Admission: RE | Admit: 2022-04-28 | Discharge: 2022-04-28 | Disposition: A | Discharge status: Home / Self Care | Payer: Commercial Managed Care - HMO | Source: Ambulatory Visit | Attending: Cardiovascular Disease | Admitting: Cardiovascular Disease

## 2022-04-28 DIAGNOSIS — R0989 Other specified symptoms and signs involving the circulatory and respiratory systems: Secondary | ICD-10-CM | POA: Insufficient documentation

## 2022-04-30 ENCOUNTER — Telehealth: Payer: Self-pay | Admitting: Internal Medicine

## 2022-04-30 MED ORDER — RIVAROXABAN 20 MG PO TABS: ORAL_TABLET | ORAL | 5 refills | Status: DC

## 2022-04-30 NOTE — Telephone Encounter (Signed)
Prescription refill request for Xarelto received.  Indication:DVT Last office visit:7/23 Weight:107.8 kg Age:65 Scr:0.8 CrCl:122.89 ml/min  Prescription refilled

## 2022-04-30 NOTE — Telephone Encounter (Signed)
*  STAT* If patient is at the pharmacy, call can be transferred to refill team.   1. Which medications need to be refilled? (please list name of each medication and dose if known) XARELTO 20 MG TABS tablet  2. Which pharmacy/location (including street and city if local pharmacy) is medication to be sent to? [id number]- [place], [place] - [address] 3. Do they need a 30 day or 90 day supply? 30 day   Patient is completely out of medication.

## 2022-05-04 ENCOUNTER — Ambulatory Visit (HOSPITAL_COMMUNITY): Payer: Commercial Managed Care - HMO

## 2022-05-11 NOTE — Progress Notes (Unsigned)
Office Visit    Patient Name: Alicia Martinez Date of Encounter: 05/13/2022  Primary Care Provider:  Harvie Junior, MD Primary Cardiologist:  None Primary Electrophysiologist: None  Chief Complaint    Alicia Martinez is a 66 y.o. 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  Past Medical History    Past Medical History:  Diagnosis Date   Allergy    Anemia    Arthritis    DJD, ARTHRITIS   Carotid artery plaque 08/30/2013   PER CAROTID DOPPLER STUDY   Colon polyps    Deep vein thrombosis (HCC)    left lower extremity   Diabetes (HCC)    Diverticulitis large intestine    Heart palpitations    HX OF PALPITATIONS AND SOME DIZZINESS - EVALUATED BY CARDIOLOGIST DR. ROSS - OFFICE NOTES IS EPIC 08/24/13 - "EVENT MONITOR SHOWED NO SIGNIFICANT ARRHYTHMIA"   History of rectal cancer    T1N0 resected 08/2009   Hypertension    Infectious colitis    Obesity    Pain    LOWER BACK AND RT KNEE AND BOTH SHOULDERS - LEFT SHOULDER PAIN WORSE--PT TOLD ARTHRITIS   Pulmonary embolism (HCC)    LAST ONE WAS 1995- ON CHRONIC COUMADIN   Rectal cancer ([place])    Sleep apnea 08/09/2012   wears CPAP   Past Surgical History:  Procedure Laterality Date   ABDOMINAL HYSTERECTOMY     CECOSTOMY N/A 03/15/2014   Procedure: partial CECECTOMY;  Surgeon: Adin Hector, MD;  Location: [facility];  Service: General;  Laterality: N/A;   COLONOSCOPY     LAPAROSCOPIC APPENDECTOMY N/A 03/15/2014   Procedure: APPENDECTOMY LAPAROSCOPIC;  Surgeon: Adin Hector, MD;  Location: [facility];  Service: General;  Laterality: N/A;   LAPAROSCOPIC LYSIS OF ADHESIONS N/A 03/15/2014   Procedure: LAPAROSCOPIC LYSIS OF ADHESIONS;  Surgeon: Adin Hector, MD;  Location: [facility];  Service: General;  Laterality: N/A;   TRANSANAL RECTAL RESECTION  08/2009    T1N0 rectal cancer Dr. Johney Maine   TUBAL LIGATION      Allergies  Allergies  Allergen Reactions   Asa [Aspirin]     REACTION: can not take due to blood thinner   Ibuprofen     Can't take due to taking blood thinners   Other     SOME ADHESIVES CAUSE SKIN IRRITATION  - STATES PAPER TAPE OK.  STATES SOME EKG ELECTRODES AND SOME SKIN PATCHES ALSO CAUSE IRRITATION    History of Present Illness    Alicia Martinez is a 66 year old female with the above-mentioned past medical history who presents today for follow-up of palpitations.  She was initially seen by Dr. Harrington Challenger in 2010 for presurgical evaluation.  She was then seen in 2014 for evaluation of palpitations were not associated with syncope, SOB, CP.  Event monitor ordered that showed no significant arrhythmias.  She was lost to follow-up and presents today for evaluation of right carotid artery stenosis  She was seen by me in clinic on 03/2022 after being lost to follow-up since 2015.  She had described bilateral neck pain and was sent for carotid ultrasounds that were unchanged from previous studies.  Her lipids and LFTs were checked with LDLs at 91 and patient was started on atorvastatin 20 mg to achieve LDL less than 70.  She was also instructed to wear  a nicotine patch for smoking cessation.  Since last being seen in the office patient reports that she is has been feeling okay will from the cardiac perspective.  She is due working on smoking cessation and is currently taking all medications without any adverse effects.  She has been seen recently by her PCP who is planning to send her to orthopedics for ongoing neck and back pain.  Her blood pressure today was well controlled at 130/70.  Patient denies chest pain, palpitations, dyspnea, PND, orthopnea, nausea, vomiting, dizziness, syncope, edema, weight gain, or early satiety.   Home Medications    Current Outpatient Medications  Medication Sig Dispense Refill   Ascorbic Acid (VITAMIN C)  1000 MG tablet Take 1,000 mg by mouth daily.     atorvastatin (LIPITOR) 20 MG tablet Take 1 tablet (20 mg total) by mouth daily. 90 tablet 3   buPROPion (WELLBUTRIN SR) 150 MG 12 hr tablet Take 1 tablet (150 mg total) by mouth 2 (two) times daily. 180 tablet 1   cyclobenzaprine (FLEXERIL) 10 MG tablet Take 5-10 mg by mouth at bedtime.     diphenhydrAMINE (BENADRYL) 25 MG tablet Take 25 mg by mouth every 4 (four) hours as needed for itching or allergies.     Ferrous Gluconate (IRON 27 PO) Take 1 tablet by mouth daily.     fluticasone (FLONASE) 50 MCG/ACT nasal spray Place 1 spray into both nostrils daily as needed for allergies or rhinitis.     furosemide (LASIX) 20 MG tablet Take 20 mg by mouth daily.  5   gabapentin (NEURONTIN) 100 MG capsule Take 100 mg by mouth 2 (two) times daily.     hydrochlorothiazide (HYDRODIURIL) 25 MG tablet Take 1 tablet (25 mg total) by mouth daily. 90 tablet 3   LEVEMIR FLEXTOUCH 100 UNIT/ML FlexPen Inject 10 Units into the skin at bedtime.     loratadine (CLARITIN) 10 MG tablet Take 10 mg by mouth daily.     meclizine (ANTIVERT) 25 MG tablet Take 1 tablet (25 mg total) by mouth 3 (three) times daily as needed for dizziness. 30 tablet 0   meloxicam (MOBIC) 15 MG tablet Take 1 tablet (15 mg total) by mouth daily. 30 tablet 0   metFORMIN (GLUCOPHAGE) 1000 MG tablet Take 1,000 mg by mouth 2 (two) times daily.     naloxone (NARCAN) nasal spray 4 mg/0.1 mL as needed.     nicotine (NICODERM CQ - DOSED IN MG/24 HOURS) 14 mg/24hr patch Place 1 patch (14 mg total) onto the skin daily. 28 patch 0   NOVOLOG FLEXPEN 100 UNIT/ML FlexPen Inject 10 Units into the skin 3 (three) times daily with meals.     Oxycodone HCl 10 MG TABS Take 10 mg by mouth every 8 (eight) hours as needed for pain.  0   potassium chloride (K-DUR) 10 MEQ tablet Take 1 tablet (10 mEq total) by mouth 2 (two) times daily. 8 tablet 0   Psyllium (METAMUCIL PO) Take 1 packet by mouth daily.     rivaroxaban  (XARELTO) 20 MG TABS tablet TAKE 1 TABLET(20 MG) BY MOUTH DAILY WITH DINNER 30 tablet 5   tetrahydrozoline 0.05 % ophthalmic solution Place 1 drop into both eyes daily as needed (allergies/red eyes). VISINE     valsartan (DIOVAN) 80 MG tablet Take 80 mg by mouth daily.     No current facility-administered medications for this visit.     Review of Systems  Please see the history of present illness.    (+)  Neck pain (+) Bilateral shoulder pain  All other systems reviewed and are otherwise negative except as noted above.  Physical Exam    Wt Readings from Last 3 Encounters:  05/13/22 228 lb (103.4 kg)  04/14/22 237 lb 9.6 oz (107.8 kg)  02/23/22 227 lb (103 kg)   VS: Vitals:   05/13/22 1155  BP: 130/70  Pulse: 94  SpO2: 94%  ,Body mass index is 36.8 kg/m.  Constitutional:      Appearance: Healthy appearance. Not in distress.  Neck:     Vascular: JVD normal.  Pulmonary:     Effort: Pulmonary effort is normal.     Breath sounds: No wheezing. No rales. Diminished in the bases Cardiovascular:     Normal rate. Regular rhythm. Normal S1. Normal S2.      Murmurs: There is no murmur.  Edema:    Peripheral edema absent.  Abdominal:     Palpations: Abdomen is soft non tender. There is no hepatomegaly.  Skin:    General: Skin is warm and dry.  Neurological:     General: No focal deficit present.     Mental Status: Alert and oriented to person, place and time.     Cranial Nerves: Cranial nerves are intact.  EKG/LABS/Other Studies Reviewed    ECG personally reviewed by me today -none completed today  Lab Results  Component Value Date   WBC 4.7 04/14/2022   HGB 11.5 04/14/2022   HCT 33.3 (L) 04/14/2022   MCV 89 04/14/2022   PLT 300 04/14/2022   Lab Results  Component Value Date   CREATININE 0.83 04/14/2022   BUN 16 04/14/2022   NA 138 04/14/2022   K 3.8 04/14/2022   CL 96 04/14/2022   CO2 28 04/14/2022   Lab Results  Component Value Date   ALT 22 04/14/2022    AST 12 04/14/2022   ALKPHOS 100 04/14/2022   BILITOT 0.4 04/14/2022   Lab Results  Component Value Date   CHOL 142 04/14/2022   HDL 34 (L) 04/14/2022   LDLCALC 91 04/14/2022   TRIG 90 04/14/2022   CHOLHDL 4.2 04/14/2022    Lab Results  Component Value Date   HGBA1C 6.5 11/08/2017    Assessment & Plan   1.  Carotid artery stenosis/PAD: -Carotid ultrasound for surveillance of carotid stenosis completed and revealed no change from previous study. -Patient was recently started on atorvastatin 20 mg for history of carotid artery disease and is on Xarelto for chronic DVT therefore ASA is contraindicated.   2.  Hypertension: -Patient's blood pressure today was 130/70 -Continue HCTZ 25 mg, and valsartan 80 mg daily -Discussed importance of abstaining from excess sodium in her diet   3.  Hyperlipidemia: -Patient's last LDL was 91 completed on 03/2022 well above goal of less than 70 -Continue atorvastatin 20 mg daily -We will recheck lipids and LFTs in 6 to 8 weeks -Patient encouraged to follow low-sodium heart healthy diet -She was also encouraged to increase physical activity 150 minutes   5.  History of chronic DVT: -Patient denies any bleeding issues currently on chronic anticoagulation with Xarelto. -Creatinine clearance is 118 and Xarelto dose is appropriate -Continue Xarelto 20 mg daily   6.  Tobacco abuse: -Patient has 61 pack/year and is currently smoking quarter pack per day -We will start Wellbutrin 150 mg -She was advised to contact 1 [phone] quit now to initiate an official stop date. -We will order noncontrast CT to evaluate for lung cancer  Disposition:  Follow-up with None or APP in 6 months   Medication Adjustments/Labs and Tests Ordered: Current medicines are reviewed at length with the patient today.  Concerns regarding medicines are outlined above.   Signed, Mable Fill, Marissa Nestle, NP 05/13/2022, 12:47 PM [place]

## 2022-05-13 ENCOUNTER — Ambulatory Visit (HOSPITAL_COMMUNITY): Payer: Medicare Other | Attending: Cardiology

## 2022-05-13 ENCOUNTER — Encounter: Payer: Self-pay | Admitting: Nurse Practitioner

## 2022-05-13 ENCOUNTER — Ambulatory Visit (INDEPENDENT_AMBULATORY_CARE_PROVIDER_SITE_OTHER): Payer: Medicare Other | Admitting: Nurse Practitioner

## 2022-05-13 VITALS — BP 130/70 | HR 94 | Ht 66.0 in | Wt 228.0 lb

## 2022-05-13 DIAGNOSIS — Z87891 Personal history of nicotine dependence: Secondary | ICD-10-CM | POA: Diagnosis not present

## 2022-05-13 DIAGNOSIS — R0609 Other forms of dyspnea: Secondary | ICD-10-CM | POA: Diagnosis not present

## 2022-05-13 DIAGNOSIS — Z7901 Long term (current) use of anticoagulants: Secondary | ICD-10-CM | POA: Diagnosis not present

## 2022-05-13 DIAGNOSIS — I6523 Occlusion and stenosis of bilateral carotid arteries: Secondary | ICD-10-CM | POA: Diagnosis not present

## 2022-05-13 DIAGNOSIS — R0600 Dyspnea, unspecified: Secondary | ICD-10-CM | POA: Insufficient documentation

## 2022-05-13 DIAGNOSIS — I739 Peripheral vascular disease, unspecified: Secondary | ICD-10-CM

## 2022-05-13 DIAGNOSIS — I10 Essential (primary) hypertension: Secondary | ICD-10-CM

## 2022-05-13 LAB — ECHOCARDIOGRAM COMPLETE
Area-P 1/2: 4.06 cm2
Height: 66 in
S' Lateral: 2.8 cm
Weight: 3648 oz

## 2022-05-13 MED ORDER — BUPROPION HCL ER (SR) 150 MG PO TB12: 150.0000 mg | ORAL_TABLET | Freq: Two times a day (BID) | ORAL | 1 refills | Status: DC

## 2022-05-13 NOTE — Patient Instructions (Addendum)
Medication Instructions:  Start BUPROPION 150 MG TWICE DAILY  *If you need a refill on your cardiac medications before your next appointment, please call your pharmacy*   Lab Work: NONE If you have labs (blood work) drawn today and your tests are completely normal, you will receive your results only by: [place] (if you have MyChart) OR A paper copy in the mail If you have any lab test that is abnormal or we need to change your treatment, we will call you to review the results.   Testing/Procedures: CHEST CT WITHOUT CONTRAST    Follow-Up: At [facility], you and your health needs are our priority.  As part of our continuing mission to provide you with exceptional heart care, we have created designated Provider Care Teams.  These Care Teams include your primary Cardiologist (physician) and Advanced Practice Providers (APPs -  Physician Assistants and Nurse Practitioners) who all work together to provide you with the care you need, when you need it.  We recommend signing up for the patient portal called "MyChart".  Sign up information is provided on this After Visit Summary.  MyChart is used to connect with patients for Virtual Visits (Telemedicine).  Patients are able to view lab/test results, encounter notes, upcoming appointments, etc.  Non-urgent messages can be sent to your provider as well.   To learn more about what you can do with MyChart, go to [email or web address].    Your next appointment:   6 month(s)  The format for your next appointment:   In Person  Provider:   Ambrose Pancoast NP        Other Instructions NONE  Important Information About Sugar

## 2022-05-25 ENCOUNTER — Ambulatory Visit (INDEPENDENT_AMBULATORY_CARE_PROVIDER_SITE_OTHER): Payer: Medicare Other | Admitting: Orthopaedic Surgery

## 2022-05-25 ENCOUNTER — Ambulatory Visit (INDEPENDENT_AMBULATORY_CARE_PROVIDER_SITE_OTHER): Payer: Medicare Other

## 2022-05-25 ENCOUNTER — Encounter: Payer: Self-pay | Admitting: Orthopaedic Surgery

## 2022-05-25 DIAGNOSIS — I6523 Occlusion and stenosis of bilateral carotid arteries: Secondary | ICD-10-CM

## 2022-05-25 DIAGNOSIS — M25532 Pain in left wrist: Secondary | ICD-10-CM

## 2022-05-25 MED ORDER — PREDNISONE 10 MG (21) PO TBPK: ORAL_TABLET | ORAL | 3 refills | Status: DC

## 2022-05-25 NOTE — Progress Notes (Signed)
Office Visit Note   Patient: Alicia Martinez           Date of Birth: [birth year]           MRN: [id number] Visit Date: 05/25/2022              Requested by: Harvie Junior, MD [address] [place],  [place] [address] PCP: Harvie Junior, MD   Assessment & Plan: Visit Diagnoses:  1. Pain in left wrist     Plan: Impression is left wrist de Quervain's tenosynovitis.  Condition explained and treatment options were reviewed.  We will start off with thumb spica brace for immobilization and a prednisone Dosepak.  She would like to hold off on cortisone injection if possible.  Follow-up as needed.  Follow-Up Instructions: No follow-ups on file.   Orders:  Orders Placed This Encounter  Procedures   XR Wrist Complete Left   Meds ordered this encounter  Medications   predniSONE (STERAPRED UNI-PAK 21 TAB) 10 MG (21) TBPK tablet    Sig: Take as directed    Dispense:  21 tablet    Refill:  3      Procedures: No procedures performed   Clinical Data: No additional findings.   Subjective: Chief Complaint  Patient presents with   Left Wrist - Pain    HPI Alicia Martinez is a 66 year old female here for 1 month of left wrist pain of insidious onset.  Ace bandage helps a little.  Feels swelling.  Currently and pain management for chronic back pain.  Here with her friend.  Right-hand-dominant.  She is diabetic and takes Xarelto.  Review of Systems  Constitutional: Negative.   HENT: Negative.    Eyes: Negative.   Respiratory: Negative.    Cardiovascular: Negative.   Endocrine: Negative.   Musculoskeletal: Negative.   Neurological: Negative.   Hematological: Negative.   Psychiatric/Behavioral: Negative.    All other systems reviewed and are negative.    Objective: Vital Signs: There were no vitals taken for this visit.  Physical Exam Vitals and nursing note reviewed.  Constitutional:      Appearance: She is well-developed.  HENT:     Head: Atraumatic.     Nose:  Nose normal.  Eyes:     Extraocular Movements: Extraocular movements intact.  Cardiovascular:     Pulses: Normal pulses.  Pulmonary:     Effort: Pulmonary effort is normal.  Abdominal:     Palpations: Abdomen is soft.  Musculoskeletal:     Cervical back: Neck supple.  Skin:    General: Skin is warm.     Capillary Refill: Capillary refill takes less than 2 seconds.  Neurological:     Mental Status: She is alert. Mental status is at baseline.  Psychiatric:        Behavior: Behavior normal.        Thought Content: Thought content normal.        Judgment: Judgment normal.     Ortho Exam Examination left wrist shows pain with Finkelstein's test.  No pain or crepitus with grind test.  No triggering.  Normal capillary refill.  Sensation intact. Specialty Comments:  No specialty comments available.  Imaging: No results found.   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 03/15/2014 02/11/2014   Osteoarthritis of multiple joints 12/31/2013   Health care maintenance 10/31/2012   Obstructive sleep apnea on CPAP 08/09/2012   Adnexal mass 01/18/2011   Long term current use of anticoagulants due to h/o PE and recurrent DVT with INR goal of 2.0-3.0 10/17/2010   Hematuria 12/01/2009   ADENOCARCINOMA, RECTUM 08/06/2009   PERS HX MAL NEOPLSM RECT RECTOSIGMOID JUNC&ANUS 08/06/2009   Obesity, Class [name], BMI 40-49.9 (morbid obesity) ([place]) 02/27/2009   History of tobacco use disorder 02/27/2009   Essential hypertension 02/27/2009   Past Medical History:  Diagnosis Date   Allergy    Anemia    Arthritis    DJD, ARTHRITIS   Carotid artery plaque 08/30/2013   PER CAROTID DOPPLER STUDY   Colon polyps    Deep vein  thrombosis (HCC)    left lower extremity   Diabetes (HCC)    Diverticulitis large intestine    Heart palpitations    HX OF PALPITATIONS AND SOME DIZZINESS - EVALUATED BY CARDIOLOGIST DR. ROSS - OFFICE NOTES IS EPIC 08/24/13 - "EVENT MONITOR SHOWED NO SIGNIFICANT ARRHYTHMIA"   History of rectal cancer    T1N0 resected 08/2009   Hypertension    Infectious colitis    Obesity    Pain    LOWER BACK AND RT KNEE AND BOTH SHOULDERS - LEFT SHOULDER PAIN WORSE--PT TOLD ARTHRITIS   Pulmonary embolism ([facility])    LAST ONE WAS 1995- ON CHRONIC COUMADIN   Rectal cancer ([facility])    Sleep apnea 08/09/2012   wears CPAP    Family History  Problem Relation Age of Onset   Cancer Mother        unknown type but patient thinks it was bone marrow cancer   Other Father        complications from severe burns   Heart disease Sister    Other Sister        Red fiber disease   Heart disease Sister    Heart disease Sister    Heart disease Brother    Clotting disorder Brother    Colonic polyp Brother    Brain cancer Brother    Heart murmur Brother    Heart failure Brother    Hypertension Brother    Clotting disorder Son    Colon cancer Neg Hx    Stomach cancer Neg Hx    Rectal cancer Neg Hx    Esophageal cancer Neg Hx     Past Surgical History:  Procedure Laterality Date   ABDOMINAL HYSTERECTOMY     CECOSTOMY N/A 03/15/2014   Procedure: partial CECECTOMY;  Surgeon: Adin Hector, MD;  Location: [facility];  Service: General;  Laterality: N/A;   COLONOSCOPY     LAPAROSCOPIC APPENDECTOMY N/A 03/15/2014   Procedure: APPENDECTOMY LAPAROSCOPIC;  Surgeon: Adin Hector, MD;  Location: [facility];  Service: General;  Laterality: N/A;   LAPAROSCOPIC LYSIS OF ADHESIONS N/A 03/15/2014   Procedure: LAPAROSCOPIC LYSIS OF ADHESIONS;  Surgeon: Adin Hector, MD;  Location: [facility];  Service: General;  Laterality: N/A;   TRANSANAL RECTAL RESECTION  08/2009   T1N0 rectal cancer Dr. Johney Maine   TUBAL LIGATION     Social  History   Occupational History    Employer: [employer]   Occupation: MACHINE OPERATOR/retired    Employer: [employer]  Tobacco Use   Smoking status: Former    Packs/day: 0.30    Years: 40.00    Total pack years: 12.00  Types: Cigarettes    Start date: 76    Quit date: 2020    Years since quitting: 3.6   Smokeless tobacco: Never  Vaping Use   Vaping Use: Never used  Substance and Sexual Activity   Alcohol use: No    Alcohol/week: 0.0 standard drinks of alcohol   Drug use: No   Sexual activity: Not on file

## 2022-05-27 ENCOUNTER — Telehealth: Payer: Self-pay | Admitting: Internal Medicine

## 2022-05-27 NOTE — Telephone Encounter (Signed)
Pt is returning call. Transferred to [place], Therapist, sports.

## 2022-05-27 NOTE — Telephone Encounter (Signed)
Pt returning nurses call regarding ECHO results. Please advise

## 2022-05-27 NOTE — Telephone Encounter (Signed)
Left message for patient to call back  

## 2022-05-27 NOTE — Telephone Encounter (Signed)
-----   Message from Marylu Lund., NP sent at 05/13/2022  3:37 PM EDT ----- Alicia Martinez's previous echo in 2014 showed strong heart pumping function of 55 to 60% and has slightly decreased to 57 %.  The left and right atria have normal size.  The right and left ventricle also are within normal size.  There were no wall motion abnormalities and valvular function was normal with no leakiness.  I would recommend she continue her current medications at present dose.  Please let me know if you have any questions regarding your most recent echo.  Ambrose Pancoast, NP

## 2022-05-27 NOTE — Telephone Encounter (Signed)
The patient has been notified of the result and verbalized understanding.  All questions (if any) were answered. Antonieta Iba, RN 05/27/2022 4:56 PM

## 2022-06-02 ENCOUNTER — Ambulatory Visit: Payer: Medicare Other | Attending: Nurse Practitioner

## 2022-06-02 DIAGNOSIS — Z79899 Other long term (current) drug therapy: Secondary | ICD-10-CM | POA: Insufficient documentation

## 2022-06-02 LAB — HEPATIC FUNCTION PANEL
ALT: 17 IU/L (ref 0–32)
AST: 12 IU/L (ref 0–40)
Albumin: 4.4 g/dL (ref 3.9–4.9)
Alkaline Phosphatase: 102 IU/L (ref 44–121)
Bilirubin Total: 0.4 mg/dL (ref 0.0–1.2)
Bilirubin, Direct: 0.13 mg/dL (ref 0.00–0.40)
Total Protein: 7 g/dL (ref 6.0–8.5)

## 2022-06-02 LAB — LIPID PANEL
Chol/HDL Ratio: 2.3 ratio (ref 0.0–4.4)
Cholesterol, Total: 106 mg/dL (ref 100–199)
HDL: 46 mg/dL (ref >39)
LDL Chol Calc (NIH): 43 mg/dL (ref 0–99)
Triglycerides: 87 mg/dL (ref 0–149)
VLDL Cholesterol Cal: 17 mg/dL (ref 5–40)

## 2022-06-07 ENCOUNTER — Ambulatory Visit
Admission: RE | Admit: 2022-06-07 | Discharge: 2022-06-07 | Disposition: A | Discharge status: Home / Self Care | Payer: Medicare Other | Source: Ambulatory Visit | Attending: Nurse Practitioner | Admitting: Nurse Practitioner

## 2022-06-07 DIAGNOSIS — Z87891 Personal history of nicotine dependence: Secondary | ICD-10-CM

## 2022-06-11 ENCOUNTER — Telehealth: Payer: Self-pay | Admitting: Nurse Practitioner

## 2022-06-11 DIAGNOSIS — Z87891 Personal history of nicotine dependence: Secondary | ICD-10-CM

## 2022-06-11 NOTE — Telephone Encounter (Signed)
Patient is returning RN's call for her lab results. Please advise.

## 2022-06-11 NOTE — Telephone Encounter (Signed)
The patient has been notified of the result and verbalized understanding.  All questions (if any) were answered. Antonieta Iba, RN 06/11/2022 2:25 PM  Repeat CT scan has been ordered.  Patient is not interested in [place] at this time.

## 2022-06-11 NOTE — Telephone Encounter (Signed)
-----   Message from Marylu Lund., NP sent at 06/09/2022  8:10 PM EDT ----- Please let Ms. Alicia Martinez know that her CT scan results showed mild emphysema and several indistinct of solid pulmonary nodules in both lungs.  There was also fatty liver disease noted on the scan.  Plan: -We will repeat CT scan in 6 months for surveillance -Regarding fatty liver disease please advise patient to increase physical activity and limit intake of sugars and carbohydrates -Please inquire if patient is interested in the [place]   (12 wks of sessions (virtual) to review lifestyle choices for better health  Ambrose Pancoast, NP

## 2022-07-06 ENCOUNTER — Ambulatory Visit (INDEPENDENT_AMBULATORY_CARE_PROVIDER_SITE_OTHER): Payer: Medicare Other | Admitting: Orthopaedic Surgery

## 2022-07-06 ENCOUNTER — Encounter: Payer: Self-pay | Admitting: Orthopaedic Surgery

## 2022-07-06 DIAGNOSIS — I6523 Occlusion and stenosis of bilateral carotid arteries: Secondary | ICD-10-CM | POA: Diagnosis not present

## 2022-07-06 DIAGNOSIS — M25532 Pain in left wrist: Secondary | ICD-10-CM | POA: Diagnosis not present

## 2022-07-06 MED ORDER — LIDOCAINE HCL 1 % IJ SOLN
0.3000 mL | INTRAMUSCULAR | Status: AC | PRN
  Administered 2022-07-06: .3 mL

## 2022-07-06 MED ORDER — METHYLPREDNISOLONE ACETATE 40 MG/ML IJ SUSP
13.3300 mg | INTRAMUSCULAR | Status: AC | PRN
  Administered 2022-07-06: 13.33 mg

## 2022-07-06 MED ORDER — BUPIVACAINE HCL 0.5 % IJ SOLN
0.3300 mL | INTRAMUSCULAR | Status: AC | PRN
  Administered 2022-07-06: .33 mL

## 2022-07-06 NOTE — Progress Notes (Signed)
Office Visit Note   Patient: Alicia Martinez           Date of Birth: 12-19-1955           MRN: [id number] Visit Date: 07/06/2022              Requested by: Harvie Junior, MD [address] [place],  [place] [address] PCP: Harvie Junior, MD   Assessment & Plan: Visit Diagnoses:  1. Pain in left wrist     Plan: Impression is left de Quervain's tenosynovitis.  She elected to undergo cortisone injection today.  We provided her with a brand-new brace to wear.  Questions encouraged and answered.  Follow-up as needed.  Follow-Up Instructions: No follow-ups on file.   Orders:  No orders of the defined types were placed in this encounter.  No orders of the defined types were placed in this encounter.     Procedures: Hand/UE Inj: L extensor compartment 1 for de Quervain's tenosynovitis on 07/06/2022 11:42 AM Indications: pain Details: 25 G needle Medications: 0.3 mL lidocaine 1 %; 0.33 mL bupivacaine 0.5 %; 13.33 mg methylPREDNISolone acetate 40 MG/ML Outcome: tolerated well, no immediate complications Patient was prepped and draped in the usual sterile fashion.       Clinical Data: No additional findings.   Subjective: Chief Complaint  Patient presents with   Left Wrist - Pain    HPI Patient returns today for ongoing left wrist pain.  Unfortunately the brace and medications did not help significantly.  She actually missed placed her brace and would like another one. Review of Systems   Objective: Vital Signs: There were no vitals taken for this visit.  Physical Exam  Ortho Exam Examination of the left wrist is unchanged. Specialty Comments:  No specialty comments available.  Imaging: No results found.   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 03/15/2014 02/11/2014   Osteoarthritis of multiple joints 12/31/2013   Health care maintenance 10/31/2012   Obstructive sleep apnea on CPAP 08/09/2012   Adnexal mass 01/18/2011   Long term current use of anticoagulants due to h/o PE and recurrent DVT with INR goal of 2.0-3.0 10/17/2010   Hematuria 12/01/2009   ADENOCARCINOMA, RECTUM 08/06/2009   PERS HX MAL NEOPLSM RECT RECTOSIGMOID JUNC&ANUS 08/06/2009   Obesity, Class [name], BMI 40-49.9 (morbid obesity) ([place]) 02/27/2009   History of tobacco use disorder 02/27/2009   Essential hypertension 02/27/2009   Past Medical History:  Diagnosis Date   Allergy    Anemia    Arthritis    DJD, ARTHRITIS   Carotid artery plaque 08/30/2013   PER CAROTID DOPPLER STUDY   Colon polyps    Deep vein thrombosis (HCC)    left lower extremity   Diabetes (HCC)    Diverticulitis large intestine    Heart palpitations    HX OF PALPITATIONS AND SOME DIZZINESS - EVALUATED BY CARDIOLOGIST DR. ROSS - OFFICE NOTES IS EPIC 08/24/13 - "EVENT MONITOR SHOWED NO SIGNIFICANT ARRHYTHMIA"   History of rectal cancer    T1N0 resected 08/2009   Hypertension    Infectious colitis    Obesity    Pain    LOWER BACK AND RT KNEE  AND BOTH SHOULDERS - LEFT SHOULDER PAIN WORSE--PT TOLD ARTHRITIS   Pulmonary embolism (HCC)    LAST ONE WAS 1995- ON CHRONIC COUMADIN   Rectal cancer (HCC)    Sleep apnea 08/09/2012   wears CPAP    Family History  Problem Relation Age of Onset   Cancer Mother        unknown type but patient thinks it was bone marrow cancer   Other Father        complications from severe burns   Heart disease Sister    Other Sister        Red fiber disease   Heart disease Sister    Heart disease Sister    Heart disease Brother    Clotting disorder Brother    Colonic polyp Brother    Brain cancer Brother    Heart murmur Brother    Heart failure  Brother    Hypertension Brother    Clotting disorder Son    Colon cancer Neg Hx    Stomach cancer Neg Hx    Rectal cancer Neg Hx    Esophageal cancer Neg Hx     Past Surgical History:  Procedure Laterality Date   ABDOMINAL HYSTERECTOMY     CECOSTOMY N/A 03/15/2014   Procedure: partial CECECTOMY;  Surgeon: Adin Hector, MD;  Location: [facility];  Service: General;  Laterality: N/A;   COLONOSCOPY     LAPAROSCOPIC APPENDECTOMY N/A 03/15/2014   Procedure: APPENDECTOMY LAPAROSCOPIC;  Surgeon: Adin Hector, MD;  Location: [facility];  Service: General;  Laterality: N/A;   LAPAROSCOPIC LYSIS OF ADHESIONS N/A 03/15/2014   Procedure: LAPAROSCOPIC LYSIS OF ADHESIONS;  Surgeon: Adin Hector, MD;  Location: [facility];  Service: General;  Laterality: N/A;   TRANSANAL RECTAL RESECTION  08/2009   T1N0 rectal cancer Dr. Johney Maine   TUBAL LIGATION     Social History   Occupational History    Employer: [employer]   Occupation: MACHINE OPERATOR/retired    Employer: [employer]  Tobacco Use   Smoking status: Former    Packs/day: 0.30    Years: 40.00    Total pack years: 12.00    Types: Cigarettes    Start date: 64    Quit date: 2020    Years since quitting: 3.7   Smokeless tobacco: Never  Vaping Use   Vaping Use: Never used  Substance and Sexual Activity   Alcohol use: No    Alcohol/week: 0.0 standard drinks of alcohol   Drug use: No   Sexual activity: Not on file

## 2022-10-28 ENCOUNTER — Institutional Professional Consult (permissible substitution) (HOSPITAL_BASED_OUTPATIENT_CLINIC_OR_DEPARTMENT_OTHER): Payer: Commercial Managed Care - HMO | Admitting: Pulmonary Disease

## 2022-11-24 ENCOUNTER — Institutional Professional Consult (permissible substitution) (HOSPITAL_BASED_OUTPATIENT_CLINIC_OR_DEPARTMENT_OTHER): Payer: Commercial Managed Care - HMO | Admitting: Pulmonary Disease

## 2022-11-29 ENCOUNTER — Telehealth: Payer: Self-pay | Admitting: *Deleted

## 2022-11-29 NOTE — Telephone Encounter (Signed)
A message was left re: scheduling her ct chest.

## 2022-12-02 ENCOUNTER — Encounter: Payer: Self-pay | Admitting: Obstetrics

## 2022-12-02 ENCOUNTER — Other Ambulatory Visit (HOSPITAL_COMMUNITY)
Admission: RE | Admit: 2022-12-02 | Discharge: 2022-12-02 | Disposition: A | Discharge status: Home / Self Care | Payer: Medicare Other | Source: Ambulatory Visit | Attending: Obstetrics | Admitting: Obstetrics

## 2022-12-02 ENCOUNTER — Ambulatory Visit (INDEPENDENT_AMBULATORY_CARE_PROVIDER_SITE_OTHER): Payer: Medicare Other | Admitting: Obstetrics

## 2022-12-02 VITALS — BP 119/70 | HR 79 | Ht 66.0 in | Wt 244.0 lb

## 2022-12-02 DIAGNOSIS — N898 Other specified noninflammatory disorders of vagina: Secondary | ICD-10-CM | POA: Diagnosis not present

## 2022-12-02 DIAGNOSIS — Z90710 Acquired absence of both cervix and uterus: Secondary | ICD-10-CM

## 2022-12-02 DIAGNOSIS — E669 Obesity, unspecified: Secondary | ICD-10-CM

## 2022-12-02 DIAGNOSIS — Z1339 Encounter for screening examination for other mental health and behavioral disorders: Secondary | ICD-10-CM | POA: Diagnosis not present

## 2022-12-02 DIAGNOSIS — Z01419 Encounter for gynecological examination (general) (routine) without abnormal findings: Secondary | ICD-10-CM | POA: Diagnosis not present

## 2022-12-02 NOTE — Progress Notes (Signed)
67 y.o  New GYN presents for AEX.  HO Hysterectomy.  Last Mammogram 01/2022 Negative per Pt.

## 2022-12-02 NOTE — Progress Notes (Signed)
Subjective:        Alicia Martinez is a 67 y.o. female here for a routine exam.  Current complaints: Vaginal discharge.    Personal health questionnaire:  Is patient Ashkenazi Jewish, have a family history of breast and/or ovarian cancer: no Is there a family history of uterine cancer diagnosed at age < 31, gastrointestinal cancer, urinary tract cancer, family member who is a Field seismologist syndrome-associated carrier: yes Is the patient overweight and hypertensive, family history of diabetes, personal history of gestational diabetes, preeclampsia or PCOS: yes Is patient over 63, have PCOS,  family history of premature CHD under age 94, diabetes, smoke, have hypertension or peripheral artery disease:  no At any time, has a partner hit, kicked or otherwise hurt or frightened you?: no Over the past 2 weeks, have you felt down, depressed or hopeless?: no Over the past 2 weeks, have you felt little interest or pleasure in doing things?:no   Gynecologic History No LMP recorded. Patient has had a hysterectomy. Contraception: status post hysterectomy Last Pap: unknown. Results were: normal Last mammogram: 2022. Results were: normal  Obstetric History OB History  Gravida Para Term Preterm AB Living  '5 3 3   2 3  '$ SAB IAB Ectopic Multiple Live Births  2       3    # Outcome Date GA Lbr Len/2nd Weight Sex Delivery Anes PTL Lv  5 Term 05/09/85    M Vag-Spont   LIV  4 Term 07/11/79    M Vag-Spont   LIV  3 Term 10/10/75    M Vag-Spont   LIV  2 SAB           1 SAB             Past Medical History:  Diagnosis Date   Allergy    Anemia    Arthritis    DJD, ARTHRITIS   Carotid artery plaque 08/30/2013   PER CAROTID DOPPLER STUDY   Colon polyps    Deep vein thrombosis (HCC)    left lower extremity   Diabetes ([place])    Diverticulitis large intestine    Heart palpitations    HX OF PALPITATIONS AND SOME DIZZINESS - EVALUATED BY CARDIOLOGIST DR. ROSS - OFFICE NOTES IS EPIC 08/24/13 - "EVENT  MONITOR SHOWED NO SIGNIFICANT ARRHYTHMIA"   History of rectal cancer    T1N0 resected 08/2009   Hypertension    Infectious colitis    Obesity    Pain    LOWER BACK AND RT KNEE AND BOTH SHOULDERS - LEFT SHOULDER PAIN WORSE--PT TOLD ARTHRITIS   Pulmonary embolism (HCC)    LAST ONE WAS 1995- ON CHRONIC COUMADIN   Rectal cancer ([place])    Sleep apnea 08/09/2012   wears CPAP    Past Surgical History:  Procedure Laterality Date   ABDOMINAL HYSTERECTOMY     CECOSTOMY N/A 03/15/2014   Procedure: partial CECECTOMY;  Surgeon: Adin Hector, MD;  [facility];  Service: General;  Laterality: N/A;   COLONOSCOPY     LAPAROSCOPIC APPENDECTOMY N/A 03/15/2014   Procedure: APPENDECTOMY LAPAROSCOPIC;  Surgeon: Adin Hector, MD;  [facility];  Service: General;  Laterality: N/A;   LAPAROSCOPIC LYSIS OF ADHESIONS N/A 03/15/2014   Procedure: LAPAROSCOPIC LYSIS OF ADHESIONS;  Surgeon: Adin Hector, MD;  [facility];  Service: General;  Laterality: N/A;   TRANSANAL RECTAL RESECTION  08/2009   T1N0 rectal cancer Dr. Johney Maine   TUBAL LIGATION  Current Outpatient Medications:    Ascorbic Acid (VITAMIN C) 1000 MG tablet, Take 1,000 mg by mouth daily., Disp: , Rfl:    atorvastatin (LIPITOR) 20 MG tablet, Take 1 tablet (20 mg total) by mouth daily., Disp: 90 tablet, Rfl: 3   buPROPion (WELLBUTRIN SR) 150 MG 12 hr tablet, Take 1 tablet (150 mg total) by mouth 2 (two) times daily., Disp: 180 tablet, Rfl: 1   cyclobenzaprine (FLEXERIL) 10 MG tablet, Take 5-10 mg by mouth at bedtime., Disp: , Rfl:    diphenhydrAMINE (BENADRYL) 25 MG tablet, Take 25 mg by mouth every 4 (four) hours as needed for itching or allergies., Disp: , Rfl:    fluticasone (FLONASE) 50 MCG/ACT nasal spray, Place 1 spray into both nostrils daily as needed for allergies or rhinitis., Disp: , Rfl:    furosemide (LASIX) 20 MG tablet, Take 20 mg by mouth daily., Disp: , Rfl: 5   hydrochlorothiazide (HYDRODIURIL) 25 MG  tablet, Take 1 tablet (25 mg total) by mouth daily., Disp: 90 tablet, Rfl: 3   LEVEMIR FLEXTOUCH 100 UNIT/ML FlexPen, Inject 10 Units into the skin at bedtime., Disp: , Rfl:    loratadine (CLARITIN) 10 MG tablet, Take 10 mg by mouth daily., Disp: , Rfl:    meclizine (ANTIVERT) 25 MG tablet, Take 1 tablet (25 mg total) by mouth 3 (three) times daily as needed for dizziness., Disp: 30 tablet, Rfl: 0   metFORMIN (GLUCOPHAGE) 1000 MG tablet, Take 1,000 mg by mouth 2 (two) times daily., Disp: , Rfl:    naloxone (NARCAN) nasal spray 4 mg/0.1 mL, as needed., Disp: , Rfl:    NOVOLOG FLEXPEN 100 UNIT/ML FlexPen, Inject 10 Units into the skin 3 (three) times daily with meals., Disp: , Rfl:    Oxycodone HCl 10 MG TABS, Take 10 mg by mouth every 8 (eight) hours as needed for pain., Disp: , Rfl: 0   potassium chloride (K-DUR) 10 MEQ tablet, Take 1 tablet (10 mEq total) by mouth 2 (two) times daily., Disp: 8 tablet, Rfl: 0   rivaroxaban (XARELTO) 20 MG TABS tablet, TAKE 1 TABLET(20 MG) BY MOUTH DAILY WITH DINNER, Disp: 30 tablet, Rfl: 5   tetrahydrozoline 0.05 % ophthalmic solution, Place 1 drop into both eyes daily as needed (allergies/red eyes). VISINE, Disp: , Rfl:    valsartan (DIOVAN) 80 MG tablet, Take 80 mg by mouth daily., Disp: , Rfl:    Ferrous Gluconate (IRON 27 PO), Take 1 tablet by mouth daily. (Patient not taking: Reported on 12/02/2022), Disp: , Rfl:    gabapentin (NEURONTIN) 100 MG capsule, Take 100 mg by mouth 2 (two) times daily. (Patient not taking: Reported on 12/02/2022), Disp: , Rfl:    meloxicam (MOBIC) 15 MG tablet, Take 1 tablet (15 mg total) by mouth daily. (Patient not taking: Reported on 12/02/2022), Disp: 30 tablet, Rfl: 0   nicotine (NICODERM CQ - DOSED IN MG/24 HOURS) 14 mg/24hr patch, Place 1 patch (14 mg total) onto the skin daily. (Patient not taking: Reported on 12/02/2022), Disp: 28 patch, Rfl: 0   predniSONE (STERAPRED UNI-PAK 21 TAB) 10 MG (21) TBPK tablet, Take as directed (Patient  not taking: Reported on 12/02/2022), Disp: 21 tablet, Rfl: 3   Psyllium (METAMUCIL PO), Take 1 packet by mouth daily., Disp: , Rfl:  Allergies  Allergen Reactions   Asa [Aspirin]     REACTION: can not take due to blood thinner   Ibuprofen     Can't take due to taking blood thinners  Other     SOME ADHESIVES CAUSE SKIN IRRITATION  - STATES PAPER TAPE OK.  STATES SOME EKG ELECTRODES AND SOME SKIN PATCHES ALSO CAUSE IRRITATION    Social History   Tobacco Use   Smoking status: Every Day    Packs/day: 0.30    Years: 40.00    Total pack years: 12.00    Types: Cigarettes    Start date: 51    Last attempt to quit: 2020    Years since quitting: 4.1   Smokeless tobacco: Never  Substance Use Topics   Alcohol use: No    Alcohol/week: 0.0 standard drinks of alcohol    Family History  Problem Relation Age of Onset   Cancer Mother        unknown type but patient thinks it was bone marrow cancer   Other Father        complications from severe burns   Heart disease Sister    Other Sister        Red fiber disease   Heart disease Sister    Heart disease Sister    Cancer Brother    Heart disease Brother    Clotting disorder Brother    Colonic polyp Brother    Brain cancer Brother    Heart murmur Brother    Heart failure Brother    Hypertension Brother    Clotting disorder Son    Colon cancer Neg Hx    Stomach cancer Neg Hx    Rectal cancer Neg Hx    Esophageal cancer Neg Hx       Review of Systems  Constitutional: negative for fatigue and weight loss Respiratory: negative for cough and wheezing Cardiovascular: negative for chest pain, fatigue and palpitations Gastrointestinal: negative for abdominal pain and change in bowel habits Musculoskeletal:negative for myalgias Neurological: negative for gait problems and tremors Behavioral/Psych: negative for abusive relationship, depression Endocrine: negative for temperature intolerance    Genitourinary: positive for vaginal  discharge.  negative for abnormal menstrual periods, genital lesions, hot flashes, sexual problems  Integument/breast: negative for breast lump, breast tenderness, nipple discharge and skin lesion(s)    Objective:       BP 119/70   Pulse 79   Ht '5\' 6"'$  (1.676 m)   Wt 244 lb (110.7 kg)   BMI 39.38 kg/m  General:   Alert and no distress  Skin:   no rash or abnormalities  Lungs:   clear to auscultation bilaterally  Heart:   regular rate and rhythm, S1, S2 normal, no murmur, click, rub or gallop  Breasts:   normal without suspicious masses, skin or nipple changes or axillary nodes  Abdomen:  normal findings: no organomegaly, soft, non-tender and no hernia  Pelvis:  External genitalia: normal general appearance Urinary system: urethral meatus normal and bladder without fullness, nontender Vaginal: normal without tenderness, induration or masses Cervix: absent Adnexa: normal bimanual exam Uterus: absent   Lab Review Urine pregnancy test Labs reviewed yes Radiologic studies reviewed yes   Assessment:    1. Encounter for annual routine gynecological examination  2. History of total abdominal hysterectomy  3. Vaginal discharge Rx: - Cervicovaginal ancillary only( [place])  4. Obesity (BMI 35.0-39.9 without comorbidity) - weight reduction recommended     Plan:    Education reviewed: calcium supplements, depression evaluation, low fat, low cholesterol diet, safe sex/STD prevention, self breast exams, and weight bearing exercise. Follow up in: 1 year.    Shelly Bombard, MD 12/02/2022 9:40 AM

## 2022-12-03 LAB — CERVICOVAGINAL ANCILLARY ONLY
Bacterial Vaginitis (gardnerella): NEGATIVE
Candida Glabrata: NEGATIVE
Candida Vaginitis: POSITIVE — AB
Chlamydia: NEGATIVE
Comment: NEGATIVE
Comment: NEGATIVE
Comment: NEGATIVE
Comment: NEGATIVE
Comment: NEGATIVE
Comment: NORMAL
Neisseria Gonorrhea: NEGATIVE
Trichomonas: NEGATIVE

## 2022-12-04 ENCOUNTER — Other Ambulatory Visit: Payer: Self-pay | Admitting: Obstetrics

## 2022-12-04 DIAGNOSIS — B3731 Acute candidiasis of vulva and vagina: Secondary | ICD-10-CM

## 2022-12-04 MED ORDER — FLUCONAZOLE 150 MG PO TABS: 150.0000 mg | ORAL_TABLET | Freq: Once | ORAL | 0 refills | Status: AC

## 2022-12-08 ENCOUNTER — Encounter (HOSPITAL_BASED_OUTPATIENT_CLINIC_OR_DEPARTMENT_OTHER): Payer: Self-pay | Admitting: Pulmonary Disease

## 2022-12-08 ENCOUNTER — Ambulatory Visit (HOSPITAL_BASED_OUTPATIENT_CLINIC_OR_DEPARTMENT_OTHER): Payer: Medicare Other | Admitting: Pulmonary Disease

## 2022-12-08 VITALS — BP 130/74 | HR 90 | Ht 66.0 in | Wt 244.0 lb

## 2022-12-08 DIAGNOSIS — G4733 Obstructive sleep apnea (adult) (pediatric): Secondary | ICD-10-CM

## 2022-12-08 DIAGNOSIS — Z9189 Other specified personal risk factors, not elsewhere classified: Secondary | ICD-10-CM | POA: Diagnosis not present

## 2022-12-08 DIAGNOSIS — R911 Solitary pulmonary nodule: Secondary | ICD-10-CM | POA: Insufficient documentation

## 2022-12-08 NOTE — Progress Notes (Signed)
Subjective:    Patient ID: Alicia Martinez, female    DOB: 1956-09-12, 67 y.o.   MRN: [id number]  HPI   Chief Complaint  Patient presents with   sleep consult    Pt is here today to get reestablished for her cpap.   66 year old smoker presents to reestablish care for OSA. She recently had low-dose CT chest screening which showed 7 mm nodule in the left lower lobe. She was last seen in 2017.  OSA was diagnosed in 2013 and she has been maintained on CPAP since then.  She initially used a nasal and then transition to a fullface mask.  Since last being seen, she has retired from her job as a Glass blower/designer due to disability in 2023.  She continues to smoke half pack per day. Epworth sleepiness score is 8. Bedtime is between 10 and 11 PM but sleep latency is variable sometimes up to 4 hours and it can be early in the morning before she falls asleep.  She reports 3-4 awakenings until she is out of bed around 10 AM.  She sleeps on her side on 5 pillows. She had gained considerable weight up to 300 pounds but is now dropped back down to 244  There is no history suggestive of cataplexy, sleep paralysis or parasomnias  She has been unable to get supplies from DME.  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 This was corrected by CPAP of 15 cm   Spirometry 10/2014 >>> no  evidence of airway obstruction  Past Medical History:  Diagnosis Date   Allergy    Anemia    Arthritis    DJD, ARTHRITIS   Carotid artery plaque 08/30/2013   PER CAROTID DOPPLER STUDY   Colon polyps    Deep vein thrombosis (HCC)    left lower extremity   Diabetes (HCC)    Diverticulitis large intestine    Heart palpitations    HX OF  PALPITATIONS AND SOME DIZZINESS - EVALUATED BY CARDIOLOGIST DR. ROSS - OFFICE NOTES IS EPIC 08/24/13 - "EVENT MONITOR SHOWED NO SIGNIFICANT ARRHYTHMIA"   History of rectal cancer    T1N0 resected 08/2009   Hypertension    Infectious colitis    Obesity    Pain    LOWER BACK AND RT KNEE AND BOTH SHOULDERS - LEFT SHOULDER PAIN WORSE--PT TOLD ARTHRITIS   Pulmonary embolism ([place]    LAST ONE WAS 1995- ON CHRONIC COUMADIN   Rectal cancer ([place]    Sleep apnea 08/09/2012   wears CPAP   Past Surgical History:  Procedure Laterality Date   ABDOMINAL HYSTERECTOMY     CECOSTOMY N/A 03/15/2014   Procedure: partial CECECTOMY;  Surgeon: SAdin Hector MD;  [facility];  Service: General;  Laterality: N/A;   COLONOSCOPY     LAPAROSCOPIC APPENDECTOMY N/A 03/15/2014   Procedure: APPENDECTOMY LAPAROSCOPIC;  Surgeon: SAdin Hector MD;  [facility];  Service: General;  Laterality: N/A;   LAPAROSCOPIC LYSIS OF ADHESIONS N/A 03/15/2014   Procedure: LAPAROSCOPIC LYSIS OF ADHESIONS;  Surgeon: SAdin Hector MD;  [facility];  Service: General;  Laterality: N/A;   TRANSANAL RECTAL RESECTION  08/2009  T1N0 rectal cancer Dr. Johney Maine   TUBAL LIGATION      Allergies  Allergen Reactions   Asa [Aspirin]     REACTION: can not take due to blood thinner   Ibuprofen     Can't take due to taking blood thinners   Other     SOME ADHESIVES CAUSE SKIN IRRITATION  - STATES PAPER TAPE OK.  STATES SOME EKG ELECTRODES AND SOME SKIN PATCHES ALSO CAUSE IRRITATION    Social History   Socioeconomic History   Marital status: Single    Spouse name: Not on file   Number of children: 3   Years of education: Not on file   Highest education level: GED or equivalent  Occupational History    Employer: [employer]   Occupation: MACHINE OPERATOR/retired    Employer: [employer]  Tobacco Use   Smoking status: Every Day    Packs/day: 0.50    Years: 40.00    Total pack years: 20.00    Types:  Cigarettes    Start date: 1980   Smokeless tobacco: Never   Tobacco comments:    Currently smoking 8cigs per day as of 12/08/22 ep  Vaping Use   Vaping Use: Never used  Substance and Sexual Activity   Alcohol use: No    Alcohol/week: 0.0 standard drinks of alcohol   Drug use: No   Sexual activity: Yes  Other Topics Concern   Not on file  Social History Narrative   ** Merged History Encounter **       Patient does not regular exercise. He has three boys. Daily caffeine use 2/day.  Patient is right handed   Social Determinants of Health   Financial Resource Strain: Not on file  Food Insecurity: Not on file  Transportation Needs: Not on file  Physical Activity: Not on file  Stress: Not on file  Social Connections: Not on file  Intimate Partner Violence: Not on file    Family History  Problem Relation Age of Onset   Cancer Mother        unknown type but patient thinks it was bone marrow cancer   Other Father        complications from severe burns   Heart disease Sister    Other Sister        Red fiber disease   Heart disease Sister    Heart disease Sister    Cancer Brother    Heart disease Brother    Clotting disorder Brother    Colonic polyp Brother    Brain cancer Brother    Heart murmur Brother    Heart failure Brother    Hypertension Brother    Clotting disorder Son    Colon cancer Neg Hx    Stomach cancer Neg Hx    Rectal cancer Neg Hx    Esophageal cancer Neg Hx      Review of Systems Constitutional: negative for anorexia, fevers and sweats  Eyes: negative for irritation, redness and visual disturbance  Ears, nose, mouth, throat, and face: negative for earaches, epistaxis, nasal congestion and sore throat  Respiratory: negative for cough, dyspnea on exertion, sputum and wheezing  Cardiovascular: negative for chest pain, dyspnea, lower extremity edema, orthopnea, palpitations and syncope  Gastrointestinal: negative for abdominal pain, constipation,  diarrhea, melena, nausea and vomiting  Genitourinary:negative for dysuria, frequency and hematuria  Hematologic/lymphatic: negative for bleeding, easy bruising and lymphadenopathy  Musculoskeletal:negative for arthralgias, muscle weakness and stiff joints  Neurological: negative for coordination problems,  gait problems, headaches and weakness  Endocrine: negative for diabetic symptoms including polydipsia, polyuria and weight loss     Objective:   Physical Exam  Gen. Pleasant, obese, in no distress ENT - no lesions, no post nasal drip Neck: No JVD, no thyromegaly, no carotid bruits Lungs: no use of accessory muscles, no dullness to percussion, decreased without rales or rhonchi  Cardiovascular: Rhythm regular, heart sounds  normal, no murmurs or gallops, no peripheral edema Musculoskeletal: No deformities, no cyanosis or clubbing , no tremors       Assessment & Plan:

## 2022-12-08 NOTE — Progress Notes (Signed)
TC. Call cannot be completed. MyChart message sent with DX, RX info and pt education.

## 2022-12-08 NOTE — Assessment & Plan Note (Signed)
There is a 7 mm nodule noted in the left lower lobe.  This is not mentioned on the CT abdomen from 2019 and presumably new. Will obtain 15-monthfollow-up CT chest without contrast to clarify

## 2022-12-08 NOTE — Assessment & Plan Note (Signed)
She had severe OSA and this has been corrected by CPAP 15 cm weight has fluctuated over the past few years.  Her machine is now 65 years old and Alicia Martinez eligible for replacement.  She is very compliant with her machine by report and CPAP is only helped improve her daytime somnolence and fatigue.  She cannot sleep without her machine. Will write her for replacement auto CPAP 10 to 16 cm, she would like to try nasal cradle mask.  Will also try to get her some new supplies Weight loss encouraged, compliance with goal of at least 4-6 hrs every night is the expectation. Advised against medications with sedative side effects Cautioned against driving when sleepy - understanding that sleepiness will vary on a day to day basis

## 2022-12-08 NOTE — Patient Instructions (Addendum)
X CT CHEST LCS NODULE FOLLOW-UP W/O CM for 4m nodule inleft lung  X CPAP supplies - including nasal cradle mask  X replacement autoCPAP 10-16 cm  Try to QUIT smoking

## 2022-12-09 ENCOUNTER — Telehealth: Payer: Self-pay | Admitting: Emergency Medicine

## 2022-12-09 NOTE — Telephone Encounter (Signed)
-----   Message from Penny Pia, RN sent at 12/08/2022  4:01 PM EDT ----- TC. Call cannot be completed. MyChart message sent with DX, RX info and pt education.

## 2022-12-09 NOTE — Telephone Encounter (Signed)
Attempted TC to patient x2. Unable to leave VM.

## 2022-12-22 ENCOUNTER — Encounter (HOSPITAL_COMMUNITY): Payer: Self-pay

## 2022-12-22 ENCOUNTER — Ambulatory Visit (HOSPITAL_COMMUNITY)
Admission: RE | Admit: 2022-12-22 | Discharge: 2022-12-22 | Disposition: A | Discharge status: Home / Self Care | Payer: Medicare Other | Source: Ambulatory Visit | Attending: Pulmonary Disease | Admitting: Pulmonary Disease

## 2022-12-22 DIAGNOSIS — R911 Solitary pulmonary nodule: Secondary | ICD-10-CM | POA: Diagnosis not present

## 2023-01-19 ENCOUNTER — Encounter: Payer: Self-pay | Admitting: Specialist

## 2023-02-05 ENCOUNTER — Other Ambulatory Visit: Payer: Self-pay | Admitting: Nurse Practitioner

## 2023-02-05 DIAGNOSIS — Z79899 Other long term (current) drug therapy: Secondary | ICD-10-CM

## 2023-04-12 ENCOUNTER — Ambulatory Visit: Payer: Medicare HMO | Admitting: Podiatry

## 2023-04-29 ENCOUNTER — Ambulatory Visit: Payer: Medicare HMO | Admitting: Podiatry

## 2023-04-29 ENCOUNTER — Ambulatory Visit: Payer: Medicare HMO | Admitting: Orthopaedic Surgery

## 2023-04-29 ENCOUNTER — Other Ambulatory Visit (INDEPENDENT_AMBULATORY_CARE_PROVIDER_SITE_OTHER): Payer: Medicare HMO

## 2023-04-29 DIAGNOSIS — M170 Bilateral primary osteoarthritis of knee: Secondary | ICD-10-CM

## 2023-04-29 DIAGNOSIS — M25561 Pain in right knee: Secondary | ICD-10-CM

## 2023-04-29 DIAGNOSIS — G8929 Other chronic pain: Secondary | ICD-10-CM

## 2023-04-29 DIAGNOSIS — M25562 Pain in left knee: Secondary | ICD-10-CM

## 2023-04-29 DIAGNOSIS — M1711 Unilateral primary osteoarthritis, right knee: Secondary | ICD-10-CM

## 2023-04-29 DIAGNOSIS — M1712 Unilateral primary osteoarthritis, left knee: Secondary | ICD-10-CM

## 2023-04-29 NOTE — Progress Notes (Signed)
Office Visit Note   Patient: Alicia Martinez           Date of Birth: 03/12/1956           MRN: [id number] Visit Date: 04/29/2023              Requested by: [name], MD [address] [place],  [place] [address] PCP: [name], MD   Assessment & Plan: Visit Diagnoses:  1. Primary osteoarthritis of right knee   2. Chronic pain of both knees   3. Primary osteoarthritis of left knee     Plan: Impression is severe right knee degenerative joint disease secondary to Osteoarthritis.  Bone on bone joint space narrowing is seen on radiographs with mild varus alignment.  At this point, conservative treatments fail to provide any significant relief and the pain is severely affecting ADLs and quality of life.  Based on treatment options, the patient has elected to move forward with a knee replacement.  We have discussed the surgical risks that include but are not limited to infection, DVT, leg length discrepancy, stiffness, numbness, tingling, incomplete relief of pain.  Recovery and prognosis were also reviewed.    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   Orders:  Orders Placed This Encounter  Procedures   XR KNEE 3 VIEW RIGHT   XR KNEE 3 VIEW LEFT   No orders of the defined types were placed in this encounter.     Procedures: No procedures performed   Clinical Data: No additional findings.   Subjective: Chief Complaint  Patient presents with   Right Knee - Pain    HPI Patient is a 67 year old female here for chronic severe bilateral knee pain worse on the right.  Has had a left knee scope and cortisone injections on both knees in the past.  She has not felt significant relief from the  injections.  Her pain has been really severe for the last couple months.  Currently takes oxycodone for chronic back pain but it does not help the knee.  She feels swelling and pain throughout the knee.  Denies any mechanical symptoms. Review of Systems  Constitutional: Negative.   HENT: Negative.    Eyes: Negative.   Respiratory: Negative.    Cardiovascular: Negative.   Endocrine: Negative.   Musculoskeletal: Negative.   Neurological: Negative.   Hematological: Negative.   Psychiatric/Behavioral: Negative.    All other systems reviewed and are negative.    Objective: Vital Signs: There were no vitals taken for this visit.  Physical Exam Vitals and nursing note reviewed.  Constitutional:      Appearance: She is well-developed.  HENT:     Head: Atraumatic.     Nose: Nose normal.  Eyes:     Extraocular Movements: Extraocular movements intact.  Cardiovascular:     Pulses: Normal pulses.  Pulmonary:     Effort: Pulmonary effort is normal.  Abdominal:     Palpations: Abdomen is soft.  Musculoskeletal:     Cervical back: Neck supple.  Skin:    General: Skin is warm.     Capillary Refill: Capillary refill takes less than 2 seconds.  Neurological:     Mental Status: She is alert. Mental status is at baseline.  Psychiatric:  Behavior: Behavior normal.        Thought Content: Thought content normal.        Judgment: Judgment normal.     Ortho Exam Examination of bilateral knees show pain and crepitus with range of motion.  Collaterals and cruciates are stable.  No joint effusion.  Medial joint line tenderness. Specialty Comments:  No specialty comments available.  Imaging: XR KNEE 3 VIEW LEFT  Result Date: 04/29/2023 Advanced tricompartmental degenerative joint disease.  Bone-on-bone joint space narrowing.  XR KNEE 3 VIEW RIGHT  Result Date: 04/29/2023 X-rays demonstrate severe osteoarthritis.  Bone-on-bone joint space narrowing.    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 03/15/2014 02/11/2014   Osteoarthritis of multiple joints 12/31/2013   Health care maintenance 10/31/2012   Obstructive sleep apnea on CPAP 08/09/2012   Adnexal mass 01/18/2011   Long term current use of anticoagulants due to h/o PE and recurrent DVT with INR goal of 2.0-3.0 10/17/2010   Hematuria 12/01/2009   ADENOCARCINOMA, RECTUM 08/06/2009   PERS HX MAL NEOPLSM RECT RECTOSIGMOID JUNC&ANUS 08/06/2009   Obesity, Class [name], BMI 40-49.9 (morbid obesity) (HCC) 02/27/2009   History of tobacco use disorder 02/27/2009   Essential hypertension 02/27/2009   Past Medical History:  Diagnosis Date   Allergy    Anemia    Arthritis    DJD, ARTHRITIS   Carotid artery plaque 08/30/2013   PER CAROTID DOPPLER STUDY   Colon polyps    Deep vein thrombosis (HCC)    left lower extremity   Diabetes (HCC)    Diverticulitis large intestine    Heart palpitations    HX OF PALPITATIONS AND SOME DIZZINESS - EVALUATED BY CARDIOLOGIST DR. ROSS - OFFICE NOTES IS EPIC 08/24/13 - "EVENT MONITOR SHOWED NO SIGNIFICANT ARRHYTHMIA"   History of rectal cancer    T1N0 resected 08/2009   Hypertension    Infectious colitis    Obesity    Pain    LOWER BACK AND RT KNEE AND BOTH SHOULDERS - LEFT SHOULDER PAIN WORSE--PT TOLD ARTHRITIS   Pulmonary embolism (HCC)    LAST ONE WAS 1995- ON CHRONIC COUMADIN   Rectal cancer (HCC)    Sleep apnea 08/09/2012   wears CPAP    Family History  Problem Relation Age of Onset   Cancer Mother        unknown type but patient thinks it was bone marrow  cancer   Other Father        complications from severe burns   Heart disease Sister    Other Sister        Red fiber disease   Heart disease Sister    Heart disease Sister    Cancer Brother    Heart disease Brother    Clotting disorder Brother    Colonic polyp Brother    Brain cancer Brother    Heart murmur Brother    Heart failure Brother    Hypertension Brother    Clotting disorder Son    Colon cancer Neg Hx    Stomach cancer Neg Hx    Rectal  cancer Neg Hx    Esophageal cancer Neg Hx     Past Surgical History:  Procedure Laterality Date   ABDOMINAL HYSTERECTOMY     CECOSTOMY N/A 03/15/2014   Procedure: partial CECECTOMY;  Surgeon: Ardeth Sportsman, MD;  Location: [facility];  Service: General;  Laterality: N/A;   COLONOSCOPY     LAPAROSCOPIC APPENDECTOMY N/A 03/15/2014   Procedure: APPENDECTOMY LAPAROSCOPIC;  Surgeon: Ardeth Sportsman, MD;  Location: [facility];  Service: General;  Laterality: N/A;   LAPAROSCOPIC LYSIS OF ADHESIONS N/A 03/15/2014   Procedure: LAPAROSCOPIC LYSIS OF ADHESIONS;  Surgeon: Ardeth Sportsman, MD;  Location: [facility];  Service: General;  Laterality: N/A;   TRANSANAL RECTAL RESECTION  08/2009   T1N0 rectal cancer Dr. Michaell Cowing   TUBAL LIGATION     Social History   Occupational History    Employer: [employer]   Occupation: MACHINE OPERATOR/retired    Employer: [employer]  Tobacco Use   Smoking status: Every Day    Current packs/day: 0.50    Average packs/day: 0.5 packs/day for 44.6 years (22.3 ttl pk-yrs)    Types: Cigarettes    Start date: 1980   Smokeless tobacco: Never   Tobacco comments:    Currently smoking 8cigs per day as of 12/08/22 ep  Vaping Use   Vaping status: Never Used  Substance and Sexual Activity   Alcohol use: No    Alcohol/week: 0.0 standard drinks of alcohol   Drug use: No   Sexual activity: Yes

## 2023-05-05 ENCOUNTER — Other Ambulatory Visit: Payer: Self-pay | Admitting: Nurse Practitioner

## 2023-05-05 DIAGNOSIS — Z79899 Other long term (current) drug therapy: Secondary | ICD-10-CM

## 2023-05-06 ENCOUNTER — Ambulatory Visit: Payer: Medicare HMO | Admitting: Podiatry

## 2023-05-06 DIAGNOSIS — M7751 Other enthesopathy of right foot: Secondary | ICD-10-CM

## 2023-05-06 DIAGNOSIS — M19071 Primary osteoarthritis, right ankle and foot: Secondary | ICD-10-CM | POA: Diagnosis not present

## 2023-05-06 NOTE — Progress Notes (Signed)
Subjective:  Patient ID: Alicia Martinez, female    DOB: June 16, 1956,  MRN: [id number]  Chief Complaint  Patient presents with   Nail Problem    67 y.o. female presents with the above complaint. Patient presents with right first metatarsophalangeal joint pain.  Patient states pain for touch is progressive and worse worse with ambulation or shoe pressure she would like to discuss steroid injection she has not seen anyone as prior to seeing me.  She states that she has some arthritis there is some stiffness associated.  She denies any other acute issues   Review of Systems: Negative except as noted in the HPI. Denies N/V/F/Ch.  Past Medical History:  Diagnosis Date   Allergy    Anemia    Arthritis    DJD, ARTHRITIS   Carotid artery plaque 08/30/2013   PER CAROTID DOPPLER STUDY   Colon polyps    Deep vein thrombosis (HCC)    left lower extremity   Diabetes (HCC)    Diverticulitis large intestine    Heart palpitations    HX OF PALPITATIONS AND SOME DIZZINESS - EVALUATED BY CARDIOLOGIST DR. ROSS - OFFICE NOTES IS EPIC 08/24/13 - "EVENT MONITOR SHOWED NO SIGNIFICANT ARRHYTHMIA"   History of rectal cancer    T1N0 resected 08/2009   Hypertension    Infectious colitis    Obesity    Pain    LOWER BACK AND RT KNEE AND BOTH SHOULDERS - LEFT SHOULDER PAIN WORSE--PT TOLD ARTHRITIS   Pulmonary embolism (HCC)    LAST ONE WAS 1995- ON CHRONIC COUMADIN   Rectal cancer (HCC)    Sleep apnea 08/09/2012   wears CPAP    Current Outpatient Medications:    Ascorbic Acid (VITAMIN C) 1000 MG tablet, Take 1,000 mg by mouth daily., Disp: , Rfl:    atorvastatin (LIPITOR) 20 MG tablet, TAKE 1 TABLET BY MOUTH EVERY DAY, Disp: 30 tablet, Rfl: 0   buPROPion (WELLBUTRIN SR) 150 MG 12 hr tablet, Take 1 tablet (150 mg total) by mouth 2 (two) times daily., Disp: 180 tablet, Rfl: 1   cyclobenzaprine (FLEXERIL) 10 MG tablet, Take 5-10 mg by mouth at bedtime., Disp: , Rfl:    diphenhydrAMINE (BENADRYL) 25  MG tablet, Take 25 mg by mouth every 4 (four) hours as needed for itching or allergies., Disp: , Rfl:    fluticasone (FLONASE) 50 MCG/ACT nasal spray, Place 1 spray into both nostrils daily as needed for allergies or rhinitis., Disp: , Rfl:    furosemide (LASIX) 20 MG tablet, Take 20 mg by mouth daily., Disp: , Rfl: 5   hydrochlorothiazide (HYDRODIURIL) 25 MG tablet, Take 1 tablet (25 mg total) by mouth daily., Disp: 90 tablet, Rfl: 3   LEVEMIR FLEXTOUCH 100 UNIT/ML FlexPen, Inject 10 Units into the skin at bedtime., Disp: , Rfl:    loratadine (CLARITIN) 10 MG tablet, Take 10 mg by mouth daily., Disp: , Rfl:    meclizine (ANTIVERT) 25 MG tablet, Take 1 tablet (25 mg total) by mouth 3 (three) times daily as needed for dizziness., Disp: 30 tablet, Rfl: 0   metFORMIN (GLUCOPHAGE) 1000 MG tablet, Take 1,000 mg by mouth 2 (two) times daily., Disp: , Rfl:    naloxone (NARCAN) nasal spray 4 mg/0.1 mL, as needed., Disp: , Rfl:    NOVOLOG FLEXPEN 100 UNIT/ML FlexPen, Inject 10 Units into the skin 3 (three) times daily with meals., Disp: , Rfl:    Oxycodone HCl 10 MG TABS, Take 10 mg by mouth every 8 (  eight) hours as needed for pain., Disp: , Rfl: 0   potassium chloride (K-DUR) 10 MEQ tablet, Take 1 tablet (10 mEq total) by mouth 2 (two) times daily., Disp: 8 tablet, Rfl: 0   Psyllium (METAMUCIL PO), Take 1 packet by mouth daily., Disp: , Rfl:    rivaroxaban (XARELTO) 20 MG TABS tablet, TAKE 1 TABLET(20 MG) BY MOUTH DAILY WITH DINNER, Disp: 30 tablet, Rfl: 5   tetrahydrozoline 0.05 % ophthalmic solution, Place 1 drop into both eyes daily as needed (allergies/red eyes). VISINE, Disp: , Rfl:    valsartan (DIOVAN) 80 MG tablet, Take 80 mg by mouth daily., Disp: , Rfl:   Social History   Tobacco Use  Smoking Status Every Day   Current packs/day: 0.50   Average packs/day: 0.5 packs/day for 44.6 years (22.3 ttl pk-yrs)   Types: Cigarettes   Start date: 1980  Smokeless Tobacco Never  Tobacco Comments    Currently smoking 8cigs per day as of 12/08/22 ep    Allergies  Allergen Reactions   Asa [Aspirin]     REACTION: can not take due to blood thinner   Ibuprofen     Can't take due to taking blood thinners   Other     SOME ADHESIVES CAUSE SKIN IRRITATION  - STATES PAPER TAPE OK.  STATES SOME EKG ELECTRODES AND SOME SKIN PATCHES ALSO CAUSE IRRITATION   Objective:  There were no vitals filed for this visit. There is no height or weight on file to calculate BMI. Constitutional Well developed. Well nourished.  Vascular Dorsalis pedis pulses palpable bilaterally. Posterior tibial pulses palpable bilaterally. Capillary refill normal to all digits.  No cyanosis or clubbing noted. Pedal hair growth normal.  Neurologic Normal speech. Oriented to person, place, and time. Epicritic sensation to light touch grossly present bilaterally.  Dermatologic Nails well groomed and normal in appearance. No open wounds. No skin lesions.  Orthopedic: Pain on palpation of right first metatarsophalangeal joint limited range of motion noted hallux rigidus noted.  Deep intra-articular pain noted.  Crepitus clinically appreciated all of these findings consistent with osteoarthritis   Radiographs: None Assessment:   1. Capsulitis of metatarsophalangeal (MTP) joint of right foot   2. Primary osteoarthritis of right foot    Plan:  Patient was evaluated and treated and all questions answered.  Right first metatarsophalangeal joint capsulitis with underlying hallux rigidus/lateral osteoarthritis -All questions and concerns were discussed with the patient extensive detail given the amount of pain that she is experiencing she will benefit from a steroid injection of decrease confirmed recommendation for pain -A steroid injection was performed at right first metatarsophalangeal joint using 1% plain Lidocaine and 10 mg of Kenalog. This was well tolerated. -Shoe gear modification discussed   No follow-ups on file.

## 2023-05-19 ENCOUNTER — Ambulatory Visit (HOSPITAL_BASED_OUTPATIENT_CLINIC_OR_DEPARTMENT_OTHER): Payer: Medicare HMO | Admitting: Pulmonary Disease

## 2023-05-28 ENCOUNTER — Other Ambulatory Visit: Payer: Self-pay | Admitting: Internal Medicine

## 2023-05-28 DIAGNOSIS — Z79899 Other long term (current) drug therapy: Secondary | ICD-10-CM

## 2023-06-18 ENCOUNTER — Encounter (HOSPITAL_BASED_OUTPATIENT_CLINIC_OR_DEPARTMENT_OTHER): Payer: Self-pay

## 2023-06-18 ENCOUNTER — Other Ambulatory Visit: Payer: Self-pay

## 2023-06-18 ENCOUNTER — Emergency Department (HOSPITAL_BASED_OUTPATIENT_CLINIC_OR_DEPARTMENT_OTHER)
Admission: EM | Admit: 2023-06-18 | Discharge: 2023-06-18 | Disposition: A | Discharge status: Home / Self Care | Payer: Medicare HMO | Attending: Emergency Medicine | Admitting: Emergency Medicine

## 2023-06-18 ENCOUNTER — Emergency Department (HOSPITAL_BASED_OUTPATIENT_CLINIC_OR_DEPARTMENT_OTHER): Payer: Medicare HMO

## 2023-06-18 DIAGNOSIS — I10 Essential (primary) hypertension: Secondary | ICD-10-CM | POA: Insufficient documentation

## 2023-06-18 DIAGNOSIS — Z79899 Other long term (current) drug therapy: Secondary | ICD-10-CM | POA: Insufficient documentation

## 2023-06-18 DIAGNOSIS — I82812 Embolism and thrombosis of superficial veins of left lower extremities: Secondary | ICD-10-CM | POA: Insufficient documentation

## 2023-06-18 DIAGNOSIS — Z7984 Long term (current) use of oral hypoglycemic drugs: Secondary | ICD-10-CM | POA: Diagnosis not present

## 2023-06-18 DIAGNOSIS — I82412 Acute embolism and thrombosis of left femoral vein: Secondary | ICD-10-CM | POA: Insufficient documentation

## 2023-06-18 DIAGNOSIS — I4891 Unspecified atrial fibrillation: Secondary | ICD-10-CM | POA: Insufficient documentation

## 2023-06-18 DIAGNOSIS — Z86718 Personal history of other venous thrombosis and embolism: Secondary | ICD-10-CM

## 2023-06-18 DIAGNOSIS — Z7901 Long term (current) use of anticoagulants: Secondary | ICD-10-CM | POA: Insufficient documentation

## 2023-06-18 DIAGNOSIS — E119 Type 2 diabetes mellitus without complications: Secondary | ICD-10-CM | POA: Insufficient documentation

## 2023-06-18 DIAGNOSIS — R2242 Localized swelling, mass and lump, left lower limb: Secondary | ICD-10-CM | POA: Diagnosis present

## 2023-06-18 MED ORDER — APIXABAN (ELIQUIS) VTE STARTER PACK (10MG AND 5MG): ORAL_TABLET | ORAL | 0 refills | Status: DC

## 2023-06-18 NOTE — ED Provider Notes (Signed)
EMERGENCY DEPARTMENT AT [facility] Provider Note   CSN: [id number] Arrival date & time: 06/18/23  1349     History  Chief Complaint  Patient presents with   Leg Swelling         Alicia Martinez is a 67 y.o. female.  Patient with history of PE/DVT currently anticoagulated with Xarelto and reports compliance. Also history of HTN, T2DM, arthritis. She reports noticing her left lower leg swelling last night with onset pain at the posteromedial knee extending to the medial thigh. No redness. No SOB, CP. No known injury.        Home Medications Prior to Admission medications   Medication Sig Start Date End Date Taking? Authorizing Provider  APIXABAN (ELIQUIS) VTE STARTER PACK (10MG  AND 5MG ) Take as directed on package: start with two-5mg  tablets twice daily for 7 days. On day 8, switch to one-5mg  tablet twice daily. 06/18/23  Yes Illias Pantano, Melvenia Beam, PA-C  Ascorbic Acid (VITAMIN C) 1000 MG tablet Take 1,000 mg by mouth daily.    [provider]  atorvastatin (LIPITOR) 20 MG tablet TAKE 1 TABLET BY MOUTH EVERY DAY 05/05/23   Pricilla Riffle, MD  buPROPion [facility] SR) 150 MG 12 hr tablet Take 1 tablet (150 mg total) by mouth 2 (two) times daily. 05/13/22   Gaston Islam., NP  cyclobenzaprine (FLEXERIL) 10 MG tablet Take 5-10 mg by mouth at bedtime. 12/12/20   [provider]  diphenhydrAMINE (BENADRYL) 25 MG tablet Take 25 mg by mouth every 4 (four) hours as needed for itching or allergies.    [provider]  fluticasone (FLONASE) 50 MCG/ACT nasal spray Place 1 spray into both nostrils daily as needed for allergies or rhinitis.    [provider]  furosemide (LASIX) 20 MG tablet Take 20 mg by mouth daily. 02/17/18   [provider]  hydrochlorothiazide (HYDRODIURIL) 25 MG tablet Take 1 tablet (25 mg total) by mouth daily. 04/18/17   Ginger Carne, MD  LEVEMIR FLEXTOUCH 100 UNIT/ML FlexPen Inject 10 Units into the skin at  bedtime. 09/19/20   [provider]  loratadine (CLARITIN) 10 MG tablet Take 10 mg by mouth daily.    [provider]  meclizine (ANTIVERT) 25 MG tablet Take 1 tablet (25 mg total) by mouth 3 (three) times daily as needed for dizziness. 12/08/17   Vanetta Mulders, MD  metFORMIN (GLUCOPHAGE) 1000 MG tablet Take 1,000 mg by mouth 2 (two) times daily. 12/28/21   [provider]  naloxone [facility]) nasal spray 4 mg/0.1 mL as needed. 12/08/21   [provider]  NOVOLOG FLEXPEN 100 UNIT/ML FlexPen Inject 10 Units into the skin 3 (three) times daily with meals. 07/23/20   [provider]  Oxycodone HCl 10 MG TABS Take 10 mg by mouth every 8 (eight) hours as needed for pain. 03/09/18   [provider]  potassium chloride (K-DUR) 10 MEQ tablet Take 1 tablet (10 mEq total) by mouth 2 (two) times daily. 03/13/18   Azalia Bilis, MD  Psyllium (METAMUCIL PO) Take 1 packet by mouth daily.    [provider]  tetrahydrozoline 0.05 % ophthalmic solution Place 1 drop into both eyes daily as needed (allergies/red eyes). VISINE    [provider]  valsartan (DIOVAN) 80 MG tablet Take 80 mg by mouth daily. 12/12/20   [provider]      Allergies    Asa [aspirin], Ibuprofen, and Other    Review of Systems  Review of Systems  Physical Exam Updated Vital Signs BP 132/66   Pulse 83   Temp 98.7 F (37.1 C) (Oral)   Resp 13   Ht 5\' 6"  (1.676 m)   Wt 105.7 kg   SpO2 95%   BMI 37.61 kg/m  Physical Exam Vitals and nursing note reviewed.  Constitutional:      General: She is not in acute distress.    Appearance: Normal appearance. She is obese.  Musculoskeletal:        General: Normal range of motion.     Comments: Mild to moderate swelling of bilateral lower legs, left marginally worse than right. No redness or warmth. Distal pulses present bilaterally. Tender over medial to posterior knee to medial distal thigh with palpable cord.  Hyperpigmented lesions to distal left leg c/w scarring without acute redness, tenderness or fluctuance.  Skin:    General: Skin is warm and dry.     Findings: No erythema.  Neurological:     Mental Status: She is alert and oriented to person, place, and time.     Sensory: No sensory deficit.     ED Results / Procedures / Treatments   Labs (all labs ordered are listed, but only abnormal results are displayed) Labs Reviewed - No data to display  EKG None  Radiology US Venous Img Lower Unilateral Left  Result Date: 06/18/2023 CLINICAL DATA:  Left leg pain EXAM: LEFT LOWER EXTREMITY VENOUS DOPPLER ULTRASOUND TECHNIQUE: Gray-scale sonography with compression, as well as color and duplex ultrasound, were performed to evaluate the deep venous system(s) from the level of the common femoral vein through the popliteal and proximal calf veins. COMPARISON:  None Available. FINDINGS: VENOUS Nonocclusive thrombus involving the common femoral, profunda femoral, superficial femoral, and popliteal veins. Vessels are incompletely compressible. The calf veins were not well seen. There is also a occlusive thrombus within a superficial vein at the medial aspect of the distal thigh. Limited views of the contralateral common femoral vein are unremarkable. OTHER None. Limitations: none IMPRESSION: 1. Positive for extensive nonocclusive left lower extremity deep venous thrombosis. 2. Occlusive superficial venous thrombosis at the medial aspect of the distal thigh. Electronically Signed   By: Duanne Guess D.O.   On: 06/18/2023 16:52    Procedures Procedures    Medications Ordered in ED Medications - No data to display  ED Course/ Medical Decision Making/ A&P Clinical Course as of 06/18/23 1807  Sat Jun 18, 2023  1618 Patient with h/o DVT/PE on Xarelto, felt to be compliant, here with new swelling and discomfort of the left leg. Doppler study has been completed and interpretation pending.  [SU]  1722  Consult to Vascular to discuss doppler findings: IMPRESSION: 1. Positive for extensive nonocclusive left lower extremity deep venous thrombosis. 2. Occlusive superficial venous thrombosis at the medial aspect of the distal thigh.  [SU]  1753 Discussed with Dr. Myra Gianotti who reviewed doppler results. Recommends switching from Xarelto to Eliquis; treat supportively for findings of SVT and refer to DVT clinic for further management.  [SU]  1757 Discussed transition from Xarelto to Eliquis with pharmacist. The patient has not taken her Xarelto dose today. Ok to start Eliquis at 10 mg BID x 7 days, then 5 mg BID. Dose provided in the ED.  [SU]    Clinical Course User Index [SU] Elpidio Anis, PA-C  Medical Decision Making Risk Prescription drug management.           Final Clinical Impression(s) / ED Diagnoses Final diagnoses:  Deep vein thrombosis (DVT) of femoral vein of left lower extremity, unspecified chronicity (HCC)  Superficial thrombosis of left lower extremity  History of DVT (deep vein thrombosis)    Rx / DC Orders ED Discharge Orders          Ordered    APIXABAN (ELIQUIS) VTE STARTER PACK (10MG  AND 5MG )       Note to Pharmacy: If starter pack unavailable, substitute with seventy-four 5 mg apixaban tabs following the above SIG directions.   06/18/23 1800    AMB Referral to Deep Vein Thrombosis Clinic       Comments: Please call [phone] with any questions.   06/18/23 1804              Elpidio Anis, PA-C 06/18/23 1808    Curatolo, Adam, DO 06/18/23 2003

## 2023-06-18 NOTE — Discharge Instructions (Addendum)
Warm compresses to the painful area of the left thigh. Take your regular pain medication as prescribed.   We are changing your anticoagulation therapy from Xarelto to Eliquis. Do not take any more of the Xarelto and take the Eliquis as prescribed.   You should hear from the DVT Clinic to arrange a follow up appointment for further evaluation and management.

## 2023-06-18 NOTE — ED Triage Notes (Addendum)
Pt c/o L leg pain behind the knee and lower leg swelling that started last night. Denies injury. Pt with hx of DVT. Pt currently on Xarelto.

## 2023-06-20 ENCOUNTER — Other Ambulatory Visit (HOSPITAL_COMMUNITY): Payer: Self-pay

## 2023-06-20 ENCOUNTER — Ambulatory Visit (HOSPITAL_COMMUNITY)
Admission: RE | Admit: 2023-06-20 | Discharge: 2023-06-20 | Disposition: A | Discharge status: Home / Self Care | Payer: Medicare PPO | Source: Ambulatory Visit | Attending: Vascular Surgery | Admitting: Vascular Surgery

## 2023-06-20 ENCOUNTER — Encounter (HOSPITAL_COMMUNITY): Payer: Self-pay

## 2023-06-20 VITALS — BP 120/60 | HR 81

## 2023-06-20 DIAGNOSIS — I82412 Acute embolism and thrombosis of left femoral vein: Secondary | ICD-10-CM | POA: Insufficient documentation

## 2023-06-20 LAB — COMPREHENSIVE METABOLIC PANEL
ALT: 36 U/L (ref 0–44)
AST: 24 U/L (ref 15–41)
Albumin: 3.1 g/dL — ABNORMAL LOW (ref 3.5–5.0)
Alkaline Phosphatase: 85 U/L (ref 38–126)
Anion gap: 10 (ref 5–15)
BUN: 9 mg/dL (ref 8–23)
CO2: 28 mmol/L (ref 22–32)
Calcium: 8.7 mg/dL — ABNORMAL LOW (ref 8.9–10.3)
Chloride: 100 mmol/L (ref 98–111)
Creatinine, Ser: 1 mg/dL (ref 0.44–1.00)
GFR, Estimated: >60 mL/min (ref >60)
Glucose, Bld: 207 mg/dL — ABNORMAL HIGH (ref 70–99)
Potassium: 3.3 mmol/L — ABNORMAL LOW (ref 3.5–5.1)
Sodium: 138 mmol/L (ref 135–145)
Total Bilirubin: 0.6 mg/dL (ref 0.3–1.2)
Total Protein: 6.8 g/dL (ref 6.5–8.1)

## 2023-06-20 LAB — CBC
HCT: 34.3 % — ABNORMAL LOW (ref 36.0–46.0)
Hemoglobin: 11 g/dL — ABNORMAL LOW (ref 12.0–15.0)
MCH: 29.6 pg (ref 26.0–34.0)
MCHC: 32.1 g/dL (ref 30.0–36.0)
MCV: 92.5 fL (ref 80.0–100.0)
Platelets: 314 10*3/uL (ref 150–400)
RBC: 3.71 MIL/uL — ABNORMAL LOW (ref 3.87–5.11)
RDW: 13.6 % (ref 11.5–15.5)
WBC: 4.4 10*3/uL (ref 4.0–10.5)
nRBC: 0 % (ref 0.0–0.2)

## 2023-06-20 MED ORDER — APIXABAN 5 MG PO TABS: 5.0000 mg | ORAL_TABLET | Freq: Two times a day (BID) | ORAL | 5 refills | Status: AC

## 2023-06-20 MED ORDER — APIXABAN (ELIQUIS) VTE STARTER PACK (10MG AND 5MG)
ORAL_TABLET | ORAL | 0 refills | Status: DC
  Filled 2023-06-20: qty 74, 30d supply, fill #0

## 2023-06-20 NOTE — Progress Notes (Signed)
DVT Clinic Note  Name: Alicia Martinez     MRN: [id number]     DOB: 1956-07-15     Sex: female  PCP: [name], MD  Today's Visit: Visit Information: Initial Visit  Referred to DVT Clinic by: Emergency Department - Elpidio Anis, PA-C Referred to CPP by: Dr. Chestine Spore Reason for referral:  Chief Complaint  Patient presents with   DVT   HISTORY OF PRESENT ILLNESS: Alicia Martinez is a 67 y.o. female with PMH HTN, T2DM, arthritis, who presents accompanied by her sister after diagnosis of DVT for medication management. 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 No other family history of VTE. She was on warfarin for years and any time they tried to take her off warfarin, she developed a new DVT. She has been on Xarelto for the last few years. She tells me today that she rarely misses a dose, maybe 1-2 per month. She takes it on an empty stomach like she does with all of her medications. She presented to the ED 06/18/23 for new pain and swelling in the left leg that started the day prior. It felt the same as when she had previous DVTs. Ultrasound was positive for extensive DVT. Dr. Myra Gianotti was consulted in the ED who recommended no vascular intervention, switching from Xarelto to Eliquis, and outpatient follow up in DVT Clinic. She was unable to [facility] due to it being out of stock and was scheduled today in the DVT Clinic to help her initiate treatment.   Positive Thrombotic Risk Factors: Previous VTE, Smoking, Obesity Bleeding Risk Factors: Anticoagulant therapy  Negative Thrombotic Risk Factors: Recent surgery (within 3 months), Recent trauma (within 3 months), Recent admission to hospital with acute illness (within 3 months), Paralysis, paresis, or recent plaster cast immobilization of lower extremity, Central venous catheterization, Bed rest >72 hours within 3 months, Sedentary  journey lasting >8 hours within 4 weeks, Pregnancy, Within 6 weeks postpartum, Recent cesarean section (within 3 months), Estrogen therapy, Testosterone therapy, Erythropoiesis-stimulating agent, Recent COVID diagnosis (within 3 months), Active cancer, Non-malignant, chronic inflammatory condition, Known thrombophilic condition, Older age  Rx Insurance Coverage: Medicare Rx Affordability: Eliquis is the same tier as Xarelto and should cost the same, which she said was affordable.  Rx Assistance Provided: Free 30-day trial card Preferred Pharmacy: Filled starter pack at [facility] during visit. [facility]   Past Medical History:  Diagnosis Date   Allergy    Anemia    Arthritis    DJD, ARTHRITIS   Carotid artery plaque 08/30/2013   PER CAROTID DOPPLER STUDY   Colon polyps    Deep vein thrombosis (HCC)    left lower extremity   Diabetes (HCC)    Diverticulitis large intestine    Heart palpitations    HX OF PALPITATIONS AND SOME DIZZINESS - EVALUATED BY CARDIOLOGIST DR. ROSS - OFFICE NOTES IS EPIC 08/24/13 - "EVENT MONITOR SHOWED NO SIGNIFICANT ARRHYTHMIA"   History of rectal cancer    T1N0 resected 08/2009   Hypertension    Infectious colitis    Obesity    Pain    LOWER BACK AND RT KNEE AND BOTH SHOULDERS - LEFT SHOULDER PAIN WORSE--PT TOLD ARTHRITIS   Pulmonary embolism (HCC)    LAST ONE WAS 1995- ON CHRONIC COUMADIN   Rectal cancer (HCC)    Sleep apnea  08/09/2012   wears CPAP    Past Surgical History:  Procedure Laterality Date   ABDOMINAL HYSTERECTOMY     CECOSTOMY N/A 03/15/2014   Procedure: partial CECECTOMY;  Surgeon: Ardeth Sportsman, MD;  Location: [facility];  Service: General;  Laterality: N/A;   COLONOSCOPY     LAPAROSCOPIC APPENDECTOMY N/A 03/15/2014   Procedure: APPENDECTOMY LAPAROSCOPIC;  Surgeon: Ardeth Sportsman, MD;  Location: [facility];  Service: General;  Laterality: N/A;   LAPAROSCOPIC LYSIS OF ADHESIONS N/A 03/15/2014   Procedure:  LAPAROSCOPIC LYSIS OF ADHESIONS;  Surgeon: Ardeth Sportsman, MD;  Location: [facility];  Service: General;  Laterality: N/A;   TRANSANAL RECTAL RESECTION  08/2009   T1N0 rectal cancer Dr. Michaell Cowing   TUBAL LIGATION      Social History   Socioeconomic History   Marital status: Single    Spouse name: Not on file   Number of children: 3   Years of education: Not on file   Highest education level: GED or equivalent  Occupational History    Employer: [employer]   Occupation: MACHINE OPERATOR/retired    Employer: [employer]  Tobacco Use   Smoking status: Every Day    Current packs/day: 0.50    Average packs/day: 0.5 packs/day for 44.7 years (22.4 ttl pk-yrs)    Types: Cigarettes    Start date: 1980   Smokeless tobacco: Never   Tobacco comments:    Currently smoking 8cigs per day as of 12/08/22 ep  Vaping Use   Vaping status: Never Used  Substance and Sexual Activity   Alcohol use: No    Alcohol/week: 0.0 standard drinks of alcohol   Drug use: No   Sexual activity: Yes  Other Topics Concern   Not on file  Social History Narrative   ** Merged History Encounter **       Patient does not regular exercise. He has three boys. Daily caffeine use 2/day.  Patient is right handed   Social Determinants of Health   Financial Resource Strain: Not on file  Food Insecurity: Not on file  Transportation Needs: Not on file  Physical Activity: Not on file  Stress: Not on file  Social Connections: Unknown (02/09/2022)   Received from [facility]   Social Network    Social Network: Not on file  Intimate Partner Violence: Unknown (01/01/2022)   [facility]   HITS    Physically Hurt: Not on file    Insult or Talk Down To: Not on file    Threaten Physical Harm: Not on file    Scream or Curse: Not on file    Family History  Problem Relation Age of Onset   Cancer Mother        unknown type but patient thinks it was bone marrow cancer    Other Father        complications from severe burns   Heart disease Sister    Other Sister        Red fiber disease   Heart disease Sister    Heart disease Sister    Cancer Brother    Heart disease Brother    Clotting disorder Brother    Colonic polyp Brother    Brain cancer Brother    Heart murmur Brother    Heart failure Brother    Hypertension Brother    Clotting disorder Son    Colon cancer Neg Hx    Stomach cancer Neg Hx  Rectal cancer Neg Hx    Esophageal cancer Neg Hx     Allergies as of 06/20/2023 - Review Complete 06/20/2023  Allergen Reaction Noted   Asa [aspirin]     Ibuprofen  02/11/2022   Other  03/08/2014    Current Outpatient Medications on File Prior to Encounter  Medication Sig Dispense Refill   Ascorbic Acid (VITAMIN C) 1000 MG tablet Take 1,000 mg by mouth daily.     atorvastatin (LIPITOR) 20 MG tablet TAKE 1 TABLET BY MOUTH EVERY DAY 30 tablet 0   cyclobenzaprine (FLEXERIL) 10 MG tablet Take 5-10 mg by mouth at bedtime.     diphenhydrAMINE (BENADRYL) 25 MG tablet Take 25 mg by mouth every 4 (four) hours as needed for itching or allergies.     fluticasone (FLONASE) 50 MCG/ACT nasal spray Place 1 spray into both nostrils daily as needed for allergies or rhinitis.     furosemide (LASIX) 20 MG tablet Take 20 mg by mouth daily.  5   hydrochlorothiazide (HYDRODIURIL) 25 MG tablet Take 1 tablet (25 mg total) by mouth daily. 90 tablet 3   loratadine (CLARITIN) 10 MG tablet Take 10 mg by mouth daily.     metFORMIN (GLUCOPHAGE) 1000 MG tablet Take 1,000 mg by mouth 2 (two) times daily.     NOVOLOG FLEXPEN 100 UNIT/ML FlexPen Inject 10 Units into the skin 3 (three) times daily with meals.     Oxycodone HCl 10 MG TABS Take 10 mg by mouth every 8 (eight) hours as needed for pain.  0   potassium chloride (K-DUR) 10 MEQ tablet Take 1 tablet (10 mEq total) by mouth 2 (two) times daily. (Patient taking differently: Take 10 mEq by mouth daily.) 8 tablet 0   Psyllium  (METAMUCIL PO) Take 1 packet by mouth daily.     tetrahydrozoline 0.05 % ophthalmic solution Place 1 drop into both eyes daily as needed (allergies/red eyes). VISINE     TRESIBA FLEXTOUCH 100 UNIT/ML FlexTouch Pen Inject 10 Units into the skin at bedtime.     valsartan (DIOVAN) 80 MG tablet Take 80 mg by mouth daily.     meclizine (ANTIVERT) 25 MG tablet Take 1 tablet (25 mg total) by mouth 3 (three) times daily as needed for dizziness. (Patient not taking: Reported on 06/20/2023) 30 tablet 0   naloxone (NARCAN) nasal spray 4 mg/0.1 mL as needed.     No current facility-administered medications on file prior to encounter.   REVIEW OF SYSTEMS:  Review of Systems  Respiratory:  Negative for shortness of breath.   Cardiovascular:  Positive for leg swelling. Negative for chest pain and palpitations.  Musculoskeletal:  Positive for myalgias.  Neurological:  Positive for tingling. Negative for dizziness.   PHYSICAL EXAMINATION:  Vitals:   06/20/23 1526  BP: 120/60  Pulse: 81  SpO2: 97%    Physical Exam Vitals reviewed.  Cardiovascular:     Rate and Rhythm: Normal rate.  Pulmonary:     Effort: Pulmonary effort is normal.  Musculoskeletal:        General: Swelling and tenderness present.  Skin:    Findings: No bruising or erythema.  Psychiatric:        Mood and Affect: Mood normal.        Behavior: Behavior normal.        Thought Content: Thought content normal.   Villalta Score for Post-Thrombotic Syndrome: Pain: Severe Cramps: Absent Heaviness: Moderate Paresthesia: Moderate Pruritus: Absent Pretibial Edema: Moderate Skin Induration: Absent Hyperpigmentation:  Absent Redness: Absent Venous Ectasia: Mild Pain on calf compression: Moderate Villalta Preliminary Score: 12 Is venous ulcer present?: No If venous ulcer is present and score is <15, then 15 points total are assigned: Absent Villalta Total Score: 12  LABS:  CBC     Component Value Date/Time   WBC 4.7  04/14/2022 1449   WBC 5.3 12/12/2020 1354   RBC 3.76 (L) 04/14/2022 1449   RBC 4.00 12/12/2020 1354   HGB 11.5 04/14/2022 1449   HCT 33.3 (L) 04/14/2022 1449   PLT 300 04/14/2022 1449   MCV 89 04/14/2022 1449   MCH 30.6 04/14/2022 1449   MCH 28.3 12/12/2020 1354   MCHC 34.5 04/14/2022 1449   MCHC 31.7 12/12/2020 1354   RDW 13.2 04/14/2022 1449   LYMPHSABS 1.8 12/12/2020 1354   LYMPHSABS 1.9 11/17/2015 1318   MONOABS 0.3 12/12/2020 1354   EOSABS 0.1 12/12/2020 1354   EOSABS 0.1 11/17/2015 1318   BASOSABS 0.0 12/12/2020 1354   BASOSABS 0.0 11/17/2015 1318    Hepatic Function      Component Value Date/Time   PROT 7.0 06/02/2022 0958   ALBUMIN 4.4 06/02/2022 0958   AST 12 06/02/2022 0958   ALT 17 06/02/2022 0958   ALKPHOS 102 06/02/2022 0958   BILITOT 0.4 06/02/2022 0958   BILIDIR 0.13 06/02/2022 0958    Renal Function   Lab Results  Component Value Date   CREATININE 0.83 04/14/2022   CREATININE 0.60 12/12/2020   CREATININE 0.66 12/12/2020    CrCl cannot be calculated (Patient's most recent lab result is older than the maximum 21 days allowed.).   Vascular Lab Studies:  06/18/23 DVT study FINDINGS: VENOUS   Nonocclusive thrombus involving the common femoral, profunda femoral, superficial femoral, and popliteal veins. Vessels are incompletely compressible. The calf veins were not well seen. There is also a occlusive thrombus within a superficial vein at the medial aspect of the distal thigh.   Limited views of the contralateral common femoral vein are unremarkable.  IMPRESSION: 1. Positive for extensive nonocclusive left lower extremity deep venous thrombosis. 2. Occlusive superficial venous thrombosis at the medial aspect of the distal thigh.  ASSESSMENT: Location of DVT: Left common femoral vein, Left femoral vein, Left popliteal vein Cause of DVT: unprovoked  Patient has extensive history of prior DVT and PE, now with new symptoms and DVT seen on  ultrasound. Dr. Myra Gianotti was consulted in the ED given the extensive DVT. He recommended no vascular intervention, to switch Xarelto to Eliquis, and follow up in DVT Clinic. She was unable to get the Eliquis starter pack [facility] and was thus seen here today. This was filled during her visit and refills have been [facility] to start next month. She endorses compliance with Xarelto prior to recent symptom onset, however she never took it with food. This could be the cause for the treatment failure as absorption of Xarelto is significantly affected by food. Eliquis is not impacted by administration with food and should hopefully be a better option for her.   She has had no labs updated within the last year. CBC and CMET were drawn today. No concerns for starting Eliquis at this time. Counseled patient extensively on the medication and importance of adherence. She has no questions or concerns at this time.   PLAN: -Stop Xarelto.  -Start apixaban (Eliquis) 10 mg twice daily for 7 days followed by 5 mg twice daily. -Expected duration of therapy: Indefinite. Therapy started on  06/20/23. -Patient educated on purpose, proper use and potential adverse effects of apixaban (Eliquis). -Discussed importance of taking medication around the same time every day. -Advised patient of medications to avoid (NSAIDs, aspirin doses >100 mg daily). -Educated that Tylenol (acetaminophen) is the preferred analgesic to lower the risk of bleeding. -Advised patient to alert all providers of anticoagulation therapy prior to starting a new medication or having a procedure. -Emphasized importance of monitoring for signs and symptoms of bleeding (abnormal bruising, prolonged bleeding, nose bleeds, bleeding from gums, discolored urine, black tarry stools). -Educated patient to present to the ED if emergent signs and symptoms of new thrombosis occur. -Counseled patient to wear compression stockings daily, removing at night.  Encouraged elevation as well to help with swelling.   Follow up: 1 month in DVT Clinic to confirm adherence.   Pervis Hocking, PharmD, [place]y [place], CPP [facility] Clinical Pharmacist Practitioner Office: [phone]

## 2023-06-20 NOTE — Patient Instructions (Addendum)
-  Stop Xarelto. -Start apixaban (Eliquis) 10 mg twice daily for 7 days followed by 5 mg twice daily. -[facility]. Make sure you ask them to cancel the "starter pack" prescription. You will still need the refills of 5 mg tablets that come in a bottle. You may need to call the pharmacy to ask them to fill this when you start to run low on your current supply.  -It is important to take your medication around the same time every day.  -Avoid NSAIDs like ibuprofen (Advil, Motrin) and naproxen (Aleve) as well as aspirin doses over 100 mg daily. -Tylenol (acetaminophen) is the preferred over the counter pain medication to lower the risk of bleeding. -Be sure to alert all of your health care providers that you are taking an anticoagulant prior to starting a new medication or having a procedure. -Monitor for signs and symptoms of bleeding (abnormal bruising, prolonged bleeding, nose bleeds, bleeding from gums, discolored urine, black tarry stools). If you have fallen and hit your head OR if your bleeding is severe or not stopping, seek emergency care.  -Go to the emergency room if emergent signs and symptoms of new clot occur (new or worse swelling and pain in an arm or leg, shortness of breath, chest pain, fast or irregular heartbeats, lightheadedness, dizziness, fainting, coughing up blood) or if you experience a significant color change (pale or blue) in the extremity that has the DVT.  -We recommend you wear compression stockings (20-30 mmHg) as long as you are having swelling or pain. Be sure to purchase the correct size and take them off at night.   Your next visit is on October 29th at 2:30 pm.  [facility] [address] [place], [place], [place] [address] Enter the hospital through Entrance C off [facility] and pull up to the [facility] entrance to the free valet parking.  Check in for your appointment at the [facility].    If you have any questions or need to reschedule an appointment, please call [phone] [facility].  If you are having an emergency, call 911 or present to the nearest emergency room.   What is a DVT?  -Deep vein thrombosis (DVT) is a condition in which a blood clot forms in a vein of the deep venous system which can occur in the lower leg, thigh, pelvis, arm, or neck. This condition is serious and can be life-threatening if the clot travels to the arteries of the lungs and causing a blockage (pulmonary embolism, PE). A DVT can also damage veins in the leg, which can lead to long-term venous disease, leg pain, swelling, discoloration, and ulcers or sores (post-thrombotic syndrome).  -Treatment may include taking an anticoagulant medication to prevent more clots from forming and the current clot from growing, wearing compression stockings, and/or surgical procedures to remove or dissolve the clot.

## 2023-06-24 ENCOUNTER — Encounter (HOSPITAL_COMMUNITY): Payer: Medicare PPO

## 2023-07-13 ENCOUNTER — Other Ambulatory Visit: Payer: Self-pay | Admitting: Internal Medicine

## 2023-07-13 DIAGNOSIS — Z79899 Other long term (current) drug therapy: Secondary | ICD-10-CM

## 2023-07-24 ENCOUNTER — Other Ambulatory Visit: Payer: Self-pay | Admitting: Internal Medicine

## 2023-07-24 DIAGNOSIS — Z79899 Other long term (current) drug therapy: Secondary | ICD-10-CM

## 2023-07-26 ENCOUNTER — Encounter (HOSPITAL_COMMUNITY): Payer: Self-pay

## 2023-07-26 ENCOUNTER — Ambulatory Visit (HOSPITAL_COMMUNITY)
Admission: RE | Admit: 2023-07-26 | Discharge: 2023-07-26 | Disposition: A | Discharge status: Home / Self Care | Payer: Medicare PPO | Source: Ambulatory Visit | Attending: Vascular Surgery | Admitting: Vascular Surgery

## 2023-07-26 VITALS — BP 110/82 | HR 99

## 2023-07-26 DIAGNOSIS — I82412 Acute embolism and thrombosis of left femoral vein: Secondary | ICD-10-CM | POA: Diagnosis present

## 2023-07-26 NOTE — Progress Notes (Signed)
DVT Clinic Note  Name: Alicia Martinez     MRN: [id number]     DOB: 19-Aug-1956     Sex: female  PCP: [name], MD  Today's Visit: Visit Information: Follow Up Visit  Referred to DVT Clinic by: Emergency Department - Elpidio Anis, PA-C Referred to CPP by: Dr. Hetty Blend Reason for referral:  Chief Complaint  Patient presents with   Med Management - DVT   HISTORY OF PRESENT ILLNESS: Alicia Martinez is a 67 y.o. female with PMH HTN, T2DM, arthritis, DVT/PE, who presents for follow up medication management for DVT. 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 No other family history of VTE. She was on warfarin for years and any time they tried to take her off warfarin, she developed a new DVT. She has been on Xarelto for the last few years. Per patient report she rarely misses a dose, maybe 1-2 per month. She presented to the ED 06/18/23 for new pain and swelling in the left leg that started the day prior. It felt the same as when she had previous DVTs. Ultrasound was positive for extensive DVT. Dr. Myra Gianotti was consulted in the ED who recommended no vascular intervention, switching from Xarelto to Eliquis, and outpatient follow up in DVT Clinic. She was unable to get [facility] due to it being out of stock and was seen in the DVT Clinic 06/20/23 to help her initiate treatment. At that time it was discovered she wasn't taking Xarelto with food which may have contributed to decreased absorption and Xarelto failure despite reported adherence.   Today patient reports that the pain in her left leg has improved. She has bilateral knee pain related to arthritis. Her LLE swelling has improved. Denies abnormal bleeding or bruising. Denies missed doses of Eliquis. Takes it at [employer] and 6pm daily. She has not been wearing compression stockings but is elevating daily which has helped the swelling. She  continues to smoke 1/2 ppd.   Positive Thrombotic Risk Factors: Previous VTE, Obesity, Smoking Bleeding Risk Factors: Anticoagulant therapy  Negative Thrombotic Risk Factors: Recent surgery (within 3 months), Recent trauma (within 3 months), Recent admission to hospital with acute illness (within 3 months), Paralysis, paresis, or recent plaster cast immobilization of lower extremity, Central venous catheterization, Bed rest >72 hours within 3 months, Sedentary journey lasting >8 hours within 4 weeks, Pregnancy, Within 6 weeks postpartum, Recent cesarean section (within 3 months), Estrogen therapy, Testosterone therapy, Erythropoiesis-stimulating agent, Recent COVID diagnosis (within 3 months), Active cancer, Non-malignant, chronic inflammatory condition, Known thrombophilic condition, Older age  Rx Insurance Coverage: Medicare Rx Affordability: Eliquis copay is $45/month but the patient says she hasn't had to pay anything this year. We used the free card to fill the starter pack during her last visit.  Rx Assistance Provided: Free 30-day trial card Preferred Pharmacy: [facility]   Past Medical History:  Diagnosis Date   Allergy    Anemia    Arthritis    DJD, ARTHRITIS   Carotid artery plaque 08/30/2013   PER CAROTID DOPPLER STUDY   Colon polyps    Deep vein thrombosis (HCC)    left lower extremity   Diabetes (HCC)    Diverticulitis large intestine    Heart palpitations    HX OF PALPITATIONS AND SOME DIZZINESS - EVALUATED BY CARDIOLOGIST DR. ROSS - OFFICE  NOTES IS EPIC 08/24/13 - "EVENT MONITOR SHOWED NO SIGNIFICANT ARRHYTHMIA"   History of rectal cancer    T1N0 resected 08/2009   Hypertension    Infectious colitis    Obesity    Pain    LOWER BACK AND RT KNEE AND BOTH SHOULDERS - LEFT SHOULDER PAIN WORSE--PT TOLD ARTHRITIS   Pulmonary embolism (HCC)    LAST ONE WAS 1995- ON CHRONIC COUMADIN   Rectal cancer (HCC)    Sleep apnea 08/09/2012   wears  CPAP    Past Surgical History:  Procedure Laterality Date   ABDOMINAL HYSTERECTOMY     CECOSTOMY N/A 03/15/2014   Procedure: partial CECECTOMY;  Surgeon: Ardeth Sportsman, MD;  Location: [facility];  Service: General;  Laterality: N/A;   COLONOSCOPY     LAPAROSCOPIC APPENDECTOMY N/A 03/15/2014   Procedure: APPENDECTOMY LAPAROSCOPIC;  Surgeon: Ardeth Sportsman, MD;  Location: [facility];  Service: General;  Laterality: N/A;   LAPAROSCOPIC LYSIS OF ADHESIONS N/A 03/15/2014   Procedure: LAPAROSCOPIC LYSIS OF ADHESIONS;  Surgeon: Ardeth Sportsman, MD;  Location: [facility];  Service: General;  Laterality: N/A;   TRANSANAL RECTAL RESECTION  08/2009   T1N0 rectal cancer Dr. Michaell Cowing   TUBAL LIGATION      Social History   Socioeconomic History   Marital status: Single    Spouse name: Not on file   Number of children: 3   Years of education: Not on file   Highest education level: GED or equivalent  Occupational History    Employer: [employer]   Occupation: MACHINE OPERATOR/retired    Employer: [employer]  Tobacco Use   Smoking status: Every Day    Current packs/day: 0.50    Average packs/day: 0.5 packs/day for 44.8 years (22.4 ttl pk-yrs)    Types: Cigarettes    Start date: 1980   Smokeless tobacco: Never   Tobacco comments:    Currently smoking 8cigs per day as of 12/08/22 ep  Vaping Use   Vaping status: Never Used  Substance and Sexual Activity   Alcohol use: No    Alcohol/week: 0.0 standard drinks of alcohol   Drug use: No   Sexual activity: Yes  Other Topics Concern   Not on file  Social History Narrative   ** Merged History Encounter **       Patient does not regular exercise. He has three boys. Daily caffeine use 2/day.  Patient is right handed   Social Determinants of Health   Financial Resource Strain: Not on file  Food Insecurity: Not on file  Transportation Needs: Not on file  Physical Activity: Not on file  Stress: Not on file  Social Connections: Unknown  (02/09/2022)   Received from [facility], [facility]   Social Network    Social Network: Not on file  Intimate Partner Violence: Unknown (01/01/2022)   Received from [facility], [facility]   HITS    Physically Hurt: Not on file    Insult or Talk Down To: Not on file    Threaten Physical Harm: Not on file    Scream or Curse: Not on file    Family History  Problem Relation Age of Onset   Cancer Mother        unknown type but patient thinks it was bone marrow cancer   Other Father        complications from severe burns   Heart disease Sister    Other Sister  Red fiber disease   Heart disease Sister    Heart disease Sister    Cancer Brother    Heart disease Brother    Clotting disorder Brother    Colonic polyp Brother    Brain cancer Brother    Heart murmur Brother    Heart failure Brother    Hypertension Brother    Clotting disorder Son    Colon cancer Neg Hx    Stomach cancer Neg Hx    Rectal cancer Neg Hx    Esophageal cancer Neg Hx     Allergies as of 07/26/2023 - Review Complete 07/26/2023  Allergen Reaction Noted   Asa [aspirin]     Ibuprofen  02/11/2022   Other  03/08/2014    Current Outpatient Medications on File Prior to Encounter  Medication Sig Dispense Refill   apixaban (ELIQUIS) 5 MG TABS tablet Take 1 tablet (5 mg total) by mouth 2 (two) times daily. Start taking after completion of starter pack. 60 tablet 5   atorvastatin (LIPITOR) 20 MG tablet TAKE 1 TABLET BY MOUTH EVERY DAY 15 tablet 0   carboxymethylcellulose 1 % ophthalmic solution Apply 1 drop to eye 2 (two) times daily as needed (dry eyes).     cyclobenzaprine (FLEXERIL) 10 MG tablet Take 5-10 mg by mouth at bedtime.     diphenhydrAMINE (BENADRYL) 25 MG tablet Take 25 mg by mouth every 4 (four) hours as needed for itching or allergies.     fluticasone (FLONASE) 50 MCG/ACT nasal spray Place 1 spray into both nostrils daily as needed for allergies or rhinitis.     furosemide (LASIX) 20  MG tablet Take 20 mg by mouth daily.  5   hydrochlorothiazide (HYDRODIURIL) 25 MG tablet Take 1 tablet (25 mg total) by mouth daily. 90 tablet 3   loratadine (CLARITIN) 10 MG tablet Take 10 mg by mouth daily.     metFORMIN (GLUCOPHAGE) 1000 MG tablet Take 1,000 mg by mouth 2 (two) times daily.     NOVOLOG FLEXPEN 100 UNIT/ML FlexPen Inject 10 Units into the skin 3 (three) times daily with meals.     Oxycodone HCl 10 MG TABS Take 10 mg by mouth every 8 (eight) hours as needed for pain.  0   potassium chloride (K-DUR) 10 MEQ tablet Take 1 tablet (10 mEq total) by mouth 2 (two) times daily. (Patient taking differently: Take 10 mEq by mouth daily.) 8 tablet 0   Psyllium (METAMUCIL PO) Take 1 packet by mouth daily.     TRESIBA FLEXTOUCH 100 UNIT/ML FlexTouch Pen Inject 10 Units into the skin at bedtime.     valsartan (DIOVAN) 80 MG tablet Take 80 mg by mouth daily.     meclizine (ANTIVERT) 25 MG tablet Take 1 tablet (25 mg total) by mouth 3 (three) times daily as needed for dizziness. (Patient not taking: Reported on 06/20/2023) 30 tablet 0   naloxone (NARCAN) nasal spray 4 mg/0.1 mL as needed.     No current facility-administered medications on file prior to encounter.   REVIEW OF SYSTEMS:  Review of Systems  Respiratory:  Negative for shortness of breath.   Cardiovascular:  Negative for chest pain, palpitations and leg swelling.  Musculoskeletal:  Positive for joint pain (knees). Negative for myalgias.  Neurological:  Negative for dizziness and tingling.   PHYSICAL EXAMINATION:  Vitals:   07/26/23 1453  BP: 110/82  Pulse: 99  SpO2: 100%   Physical Exam Vitals reviewed.  Cardiovascular:     Rate and  Rhythm: Normal rate.  Pulmonary:     Effort: Pulmonary effort is normal.  Musculoskeletal:        General: No tenderness.     Left lower leg: Edema (mild, improved from prior) present.  Skin:    Findings: No bruising or erythema.  Psychiatric:        Mood and Affect: Mood normal.         Behavior: Behavior normal.        Thought Content: Thought content normal.   Villalta Score for Post-Thrombotic Syndrome: Pain: Mild Cramps: Mild Heaviness: Moderate Paresthesia: Absent Pruritus: Moderate Pretibial Edema: Mild Skin Induration: Mild Hyperpigmentation: Absent Redness: Absent Venous Ectasia: Absent Pain on calf compression: Absent Villalta Preliminary Score: 8 Is venous ulcer present?: No If venous ulcer is present and score is <15, then 15 points total are assigned: Absent Villalta Total Score: 8  LABS:  CBC     Component Value Date/Time   WBC 4.4 06/20/2023 1642   RBC 3.71 (L) 06/20/2023 1642   HGB 11.0 (L) 06/20/2023 1642   HGB 11.5 04/14/2022 1449   HCT 34.3 (L) 06/20/2023 1642   HCT 33.3 (L) 04/14/2022 1449   PLT 314 06/20/2023 1642   PLT 300 04/14/2022 1449   MCV 92.5 06/20/2023 1642   MCV 89 04/14/2022 1449   MCH 29.6 06/20/2023 1642   MCHC 32.1 06/20/2023 1642   RDW 13.6 06/20/2023 1642   RDW 13.2 04/14/2022 1449   LYMPHSABS 1.8 12/12/2020 1354   LYMPHSABS 1.9 11/17/2015 1318   MONOABS 0.3 12/12/2020 1354   EOSABS 0.1 12/12/2020 1354   EOSABS 0.1 11/17/2015 1318   BASOSABS 0.0 12/12/2020 1354   BASOSABS 0.0 11/17/2015 1318    Hepatic Function      Component Value Date/Time   PROT 6.8 06/20/2023 1642   PROT 7.0 06/02/2022 0958   ALBUMIN 3.1 (L) 06/20/2023 1642   ALBUMIN 4.4 06/02/2022 0958   AST 24 06/20/2023 1642   ALT 36 06/20/2023 1642   ALKPHOS 85 06/20/2023 1642   BILITOT 0.6 06/20/2023 1642   BILITOT 0.4 06/02/2022 0958   BILIDIR 0.13 06/02/2022 0958    Renal Function   Lab Results  Component Value Date   CREATININE 1.00 06/20/2023   CREATININE 0.83 04/14/2022   CREATININE 0.60 12/12/2020    CrCl cannot be calculated (Patient's most recent lab result is older than the maximum 21 days allowed.).   [facility] Vascular Lab Studies:  06/18/23 DVT study FINDINGS: VENOUS   Nonocclusive thrombus involving the common femoral,  profunda femoral, superficial femoral, and popliteal veins. Vessels are incompletely compressible. The calf veins were not well seen. There is also a occlusive thrombus within a superficial vein at the medial aspect of the distal thigh.   Limited views of the contralateral common femoral vein are unremarkable.   IMPRESSION: 1. Positive for extensive nonocclusive left lower extremity deep venous thrombosis. 2. Occlusive superficial venous thrombosis at the medial aspect of the distal thigh.  ASSESSMENT: Location of DVT: Left common femoral vein, Left femoral vein, Left popliteal vein Cause of DVT: unprovoked  Patient with extensive history of unprovoked DVT in 1980, 1995, and 1996 with PE also in 1995, and now LLE DVT diagnosed 05/2023. She was taking Xarelto at the time and was switched to Eliquis in the ED per Dr. Estanislado Spire recommendation. She was not taking Xarelto with food which decreases absorption of the medication. No need for vascular surgery intervention. We assisted with medication access at her last visit. No  medication access issues at this time. Confirmed today that she is taking Eliquis as prescribed and has seen improvement in LLE pain and swelling. No bleeding. 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 Patient provided with 6 months of refills. Patient aware future refills will need to come from PCP.   She is still smoking 1/2 ppd (10 cigarettes). She was prescribed bupropion by her cardiologist last year but decided not to start taking it, but she is more interested in quitting now. Her grandchildren are her main motivation to quit. She has another grandchild due on January 1. We made a plan for her to try to decrease each week by 1 cigarette starting this week. She plans to discuss with her PCP this month whether she would benefit from bupropion to help with quitting.   PLAN: -Continue apixaban (Eliquis) 5 mg twice daily. -Expected duration  of therapy: Indefinite. Therapy started on 06/20/23. -Patient educated on purpose, proper use and potential adverse effects of apixaban (Eliquis). -Discussed importance of taking medication around the same time every day. -Advised patient of medications to avoid (NSAIDs, aspirin doses >100 mg daily). -Educated that Tylenol (acetaminophen) is the preferred analgesic to lower the risk of bleeding. -Advised patient to alert all providers of anticoagulation therapy prior to starting a new medication or having a procedure. -Emphasized importance of monitoring for signs and symptoms of bleeding (abnormal bruising, prolonged bleeding, nose bleeds, bleeding from gums, discolored urine, black tarry stools). -Educated patient to present to the ED if emergent signs and symptoms of new thrombosis occur. -Counseled patient to wear compression stockings daily, removing at night. Continue elevating legs as needed to help with swelling.   Follow up: with PCP. DVT Clinic available as needed.   Pervis Hocking, PharmD, [place], CPP Deep Vein Thrombosis Clinic Clinical Pharmacist Practitioner Office: [phone]

## 2023-07-26 NOTE — Patient Instructions (Addendum)
-  Continue apixaban (Eliquis) 5 mg twice daily. -You [facility] Your primary doctor Dr. Mayford Knife will need to send in refills after this. Eliquis will be a lifelong medication for you.  -Work on quitting smoking. Try to decrease by 1 cigarette daily per week!  -Follow up with me as needed.  -It is important to take your medication around the same time every day.  -Avoid NSAIDs like ibuprofen (Advil, Motrin) and naproxen (Aleve) as well as aspirin doses over 100 mg daily. -Tylenol (acetaminophen) is the preferred over the counter pain medication to lower the risk of bleeding. -Be sure to alert all of your health care providers that you are taking an anticoagulant prior to starting a new medication or having a procedure. -Monitor for signs and symptoms of bleeding (abnormal bruising, prolonged bleeding, nose bleeds, bleeding from gums, discolored urine, black tarry stools). If you have fallen and hit your head OR if your bleeding is severe or not stopping, seek emergency care.  -Go to the emergency room if emergent signs and symptoms of new clot occur (new or worse swelling and pain in an arm or leg, shortness of breath, chest pain, fast or irregular heartbeats, lightheadedness, dizziness, fainting, coughing up blood) or if you experience a significant color change (pale or blue) in the extremity that has the DVT.  -We recommend you wear compression stockings (20-30 mmHg) as long as you are having swelling or pain. Be sure to purchase the correct size and take them off at night. Keep elevating your legs to help with swelling.   If you have any questions or need to reschedule an appointment, please call [phone] [facility].  If you are having an emergency, call 911 or present to the nearest emergency room.   What is a DVT?  -Deep vein thrombosis (DVT) is a condition in which a blood clot forms in a vein of the deep venous system which can occur in the lower leg, thigh,  pelvis, arm, or neck. This condition is serious and can be life-threatening if the clot travels to the arteries of the lungs and causing a blockage (pulmonary embolism, PE). A DVT can also damage veins in the leg, which can lead to long-term venous disease, leg pain, swelling, discoloration, and ulcers or sores (post-thrombotic syndrome).  -Treatment may include taking an anticoagulant medication to prevent more clots from forming and the current clot from growing, wearing compression stockings, and/or surgical procedures to remove or dissolve the clot.

## 2023-08-04 ENCOUNTER — Other Ambulatory Visit: Payer: Self-pay | Admitting: Internal Medicine

## 2023-08-04 DIAGNOSIS — Z79899 Other long term (current) drug therapy: Secondary | ICD-10-CM

## 2023-08-04 NOTE — Telephone Encounter (Signed)
Pt has had 3 attempts to schedule overdue appt and has not done so. Does Dr. Tenny Craw want to refill this medication? Please advise

## 2023-08-16 ENCOUNTER — Encounter (HOSPITAL_BASED_OUTPATIENT_CLINIC_OR_DEPARTMENT_OTHER): Payer: Self-pay | Admitting: Pulmonary Disease

## 2023-08-16 ENCOUNTER — Ambulatory Visit (HOSPITAL_BASED_OUTPATIENT_CLINIC_OR_DEPARTMENT_OTHER): Payer: Medicare PPO | Admitting: Pulmonary Disease

## 2023-08-16 VITALS — BP 124/72 | HR 94 | Ht 66.0 in | Wt 237.0 lb

## 2023-08-16 DIAGNOSIS — Z9189 Other specified personal risk factors, not elsewhere classified: Secondary | ICD-10-CM

## 2023-08-16 DIAGNOSIS — Z23 Encounter for immunization: Secondary | ICD-10-CM

## 2023-08-16 DIAGNOSIS — G4733 Obstructive sleep apnea (adult) (pediatric): Secondary | ICD-10-CM

## 2023-08-16 DIAGNOSIS — R911 Solitary pulmonary nodule: Secondary | ICD-10-CM

## 2023-08-16 DIAGNOSIS — Z720 Tobacco use: Secondary | ICD-10-CM

## 2023-08-16 NOTE — Assessment & Plan Note (Signed)
Smoking cessation was emphasized is the most important intervention that would add years to her life 

## 2023-08-16 NOTE — Patient Instructions (Signed)
x follow-up LDCT scan in March 2025 will be scheduled  You have to quit smoking!  We have sent in prescription for replacement auto CPAP 10 to 16 cm Please call [facility] and confirm  x flu shot today

## 2023-08-16 NOTE — Addendum Note (Signed)
Addended by: Winn Jock on: 08/16/2023 01:02 PM   Modules accepted: Orders

## 2023-08-16 NOTE — Assessment & Plan Note (Signed)
Left lower lobe nodule will need follow-up low-dose CT chest scheduled in March 2025.  She is at some risk for malignancy although stability over 6 months was reassuring

## 2023-08-16 NOTE — Assessment & Plan Note (Signed)
She is very compliant with her CPAP machine and CPAP is only helped improve her daytime somnolence and fatigue. She will be eligible for replacement auto CPAP 10 to 16 cm.  I will give her phone number DME to contact so that she can obtain this machine. Weight loss encouraged, compliance with goal of at least 4-6 hrs every night is the expectation. Advised against medications with sedative side effects Cautioned against driving when sleepy - understanding that sleepiness will vary on a day to day basis

## 2023-08-16 NOTE — Progress Notes (Signed)
   Subjective:    Patient ID: Alicia Martinez, female    DOB: [birth year], 67 y.o.   MRN: [id number]  HPI   67 yo smoker for FU of OSA &  LLL 7mm pulm nodule. She had low-dose CT chest screening in 2023 which showed 7 mm nodule in the left lower lobe. OSA was diagnosed in 2013 and  maintained on CPAP 15cm since then  70-month follow-up visit. She continues to smoke half pack per day.  Unable to commit to a quit attempt. We reviewed prior CT scan which shows stable left lower lobe nodule At her last visit 11/2022 where she reestablish care, will send a prescription for replacement auto CPAP 10 to 16 cm.  However she states that her phone number changed and she has not been able to establish contact with DME.  She is still using her own machine and this is falling apart. She is undergoing evaluation for knee replacement  Significant tests/ events reviewed  LDCT chest 11/2022 7mm stable nodule  LDCT chest 05/2022 LLL 7mm nodule   PSG 12/ 2013 -wt 250 pounds, showed severe obstructive sleep apnea with AHI of 88 events per hour, lowest desaturation of 84 percent. This was corrected by CPAP of 15 cm   Spirometry 10/2014 >>> no  evidence of airway obstruction  Review of Systems neg for any significant sore throat, dysphagia, itching, sneezing, nasal congestion or excess/ purulent secretions, fever, chills, sweats, unintended wt loss, pleuritic or exertional cp, hempoptysis, orthopnea pnd or change in chronic leg swelling. Also denies presyncope, palpitations, heartburn, abdominal pain, nausea, vomiting, diarrhea or change in bowel or urinary habits, dysuria,hematuria, rash, arthralgias, visual complaints, headache, numbness weakness or ataxia.     Objective:   Physical Exam  Gen. Pleasant, obese, in no distress ENT - no lesions, no post nasal drip Neck: No JVD, no thyromegaly, no carotid bruits Lungs: no use of accessory muscles, no dullness to percussion, decreased without rales or rhonchi   Cardiovascular: Rhythm regular, heart sounds  normal, no murmurs or gallops, no peripheral edema Musculoskeletal: No deformities, no cyanosis or clubbing , no tremors        Assessment & Plan:

## 2023-08-17 ENCOUNTER — Encounter: Payer: Self-pay | Admitting: Orthopaedic Surgery

## 2023-08-17 ENCOUNTER — Ambulatory Visit: Payer: Medicare PPO | Admitting: Orthopaedic Surgery

## 2023-08-17 VITALS — Ht 66.0 in | Wt 239.0 lb

## 2023-08-17 DIAGNOSIS — M170 Bilateral primary osteoarthritis of knee: Secondary | ICD-10-CM

## 2023-08-17 NOTE — Progress Notes (Signed)
Office Visit Note   Patient: Alicia Martinez           Date of Birth: 06/21/1956           MRN: [id number] Visit Date: 08/17/2023              Requested by: [name], MD [address] [place],  [place] [address] PCP: [name], MD   Assessment & Plan: Visit Diagnoses:  1. Bilateral primary osteoarthritis of knee     Plan: Impression is advanced bilateral knee osteoarthritis.  Patient has previously undergone cortisone injections with only temporary relief.  She would like to proceed with surgery, however she was recently diagnosed with DVT of the left lower extremity.  We would like for her to wait at least 9 months before proceeding with surgery.  She is also a type II diabetic.  Will need to obtain a new A1c prior to scheduling surgery.  She will follow-up with [place] later next spring or as needed.  Call with concerns or questions in the meantime.  Follow-Up Instructions: Return if symptoms worsen or fail to improve.   Orders:  No orders of the defined types were placed in this encounter.  No orders of the defined types were placed in this encounter.     Procedures: No procedures performed   Clinical Data: No additional findings.   Subjective: Chief Complaint  Patient presents with   Right Knee - Pain   Left Knee - Pain    HPI patient is a pleasant 67 year old female who comes in today with continued bilateral knee pain.  Symptoms have been ongoing for years and have progressively worsened over time.  Pain is constant but worse with walking, going from a seated to standing position as well as at night when she is trying to sleep.  She does take occasional oxycodone prescribed by her PCP.  She has undergone cortisone injections in the past with good but temporary relief.  She does tell me she was recently diagnosed with left lower extremity DVT on 06/18/2023 while on Xarelto.  She has since been switched to Eliquis.  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  She is also a smoker.  She is a type II diabetic.  Review of Systems as detailed in HPI.  All others reviewed and are negative.   Objective: Vital Signs: Ht 5\' 6"  (1.676 m)   Wt 239 lb (108.4 kg)   BMI 38.58 kg/m   Physical Exam well-developed well-nourished female in no acute distress.  Alert and oriented x 3.  Ortho Exam bilateral knee exam: Small effusion.  Range of motion 0 to 90 degrees.  Medial joint line tenderness.  She is neurovascular intact distally.  Specialty Comments:  No specialty comments available.  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/15/2014 02/11/2014   Osteoarthritis of multiple joints 12/31/2013   Health care  maintenance 10/31/2012   Obstructive sleep apnea on CPAP 08/09/2012   Adnexal mass 01/18/2011   Long term current use of anticoagulants due to h/o PE and recurrent DVT with INR goal of 2.0-3.0 10/17/2010   Hematuria 12/01/2009   ADENOCARCINOMA, RECTUM 08/06/2009   PERS HX MAL NEOPLSM RECT RECTOSIGMOID JUNC&ANUS 08/06/2009   Obesity, Class [name], BMI 40-49.9 (morbid obesity) (HCC) 02/27/2009   Tobacco abuse 02/27/2009   Essential hypertension 02/27/2009   Past Medical History:  Diagnosis Date   Allergy    Anemia    Arthritis    DJD, ARTHRITIS   Carotid artery plaque 08/30/2013   PER CAROTID DOPPLER STUDY   Colon polyps    Deep vein thrombosis (HCC)    left lower extremity    Diabetes (HCC)    Diverticulitis large intestine    Heart palpitations    HX OF PALPITATIONS AND SOME DIZZINESS - EVALUATED BY CARDIOLOGIST DR. ROSS - OFFICE NOTES IS EPIC 08/24/13 - "EVENT MONITOR SHOWED NO SIGNIFICANT ARRHYTHMIA"   History of rectal cancer    T1N0 resected 08/2009   Hypertension    Infectious colitis    Obesity    Pain    LOWER BACK AND RT KNEE AND BOTH SHOULDERS - LEFT SHOULDER PAIN WORSE--PT TOLD ARTHRITIS   Pulmonary embolism (HCC)    LAST ONE WAS 1995- ON CHRONIC COUMADIN   Rectal cancer (HCC)    Sleep apnea 08/09/2012   wears CPAP    Family History  Problem Relation Age of Onset   Cancer Mother        unknown type but patient thinks it was bone marrow cancer   Other Father        complications from severe burns   Heart disease Sister    Other Sister        Red fiber disease   Heart disease Sister    Heart disease Sister    Cancer Brother    Heart disease Brother    Clotting disorder Brother    Colonic polyp Brother    Brain cancer Brother    Heart murmur Brother    Heart failure Brother    Hypertension Brother    Clotting disorder Son    Colon cancer Neg Hx    Stomach cancer Neg Hx    Rectal cancer Neg Hx    Esophageal cancer Neg Hx     Past Surgical History:  Procedure Laterality Date   ABDOMINAL HYSTERECTOMY     CECOSTOMY N/A 03/15/2014   Procedure: partial CECECTOMY;  Surgeon: Ardeth Sportsman, MD;  Location: [facility];  Service: General;  Laterality: N/A;   COLONOSCOPY     LAPAROSCOPIC APPENDECTOMY N/A 03/15/2014   Procedure: APPENDECTOMY LAPAROSCOPIC;  Surgeon: Ardeth Sportsman, MD;  Location: [facility];  Service: General;  Laterality: N/A;   LAPAROSCOPIC LYSIS OF ADHESIONS N/A 03/15/2014   Procedure: LAPAROSCOPIC LYSIS OF ADHESIONS;  Surgeon: Ardeth Sportsman, MD;  Location: [facility];  Service: General;  Laterality: N/A;   TRANSANAL RECTAL RESECTION  08/2009   T1N0 rectal cancer Dr. Michaell Cowing   TUBAL LIGATION     Social History   Occupational  History    Employer: [employer]   Occupation: MACHINE OPERATOR/retired    Employer: [employer]  Tobacco Use   Smoking status: Every Day    Current packs/day: 0.50    Average packs/day: 0.5 packs/day for 44.9 years (22.4 ttl pk-yrs)    Types: Cigarettes    Start date: 62  Smokeless tobacco: Never   Tobacco comments:    Currently smoking 8cigs per day as of 12/08/22 ep  Vaping Use   Vaping status: Never Used  Substance and Sexual Activity   Alcohol use: No    Alcohol/week: 0.0 standard drinks of alcohol   Drug use: No   Sexual activity: Yes

## 2023-09-17 ENCOUNTER — Emergency Department (HOSPITAL_COMMUNITY): Payer: Medicare PPO

## 2023-09-17 ENCOUNTER — Emergency Department (HOSPITAL_BASED_OUTPATIENT_CLINIC_OR_DEPARTMENT_OTHER)
Admission: EM | Admit: 2023-09-17 | Discharge: 2023-09-17 | Discharge status: Against Medical Advice | Payer: Medicare PPO | Attending: Emergency Medicine | Admitting: Emergency Medicine

## 2023-09-17 ENCOUNTER — Other Ambulatory Visit: Payer: Self-pay

## 2023-09-17 ENCOUNTER — Encounter (HOSPITAL_BASED_OUTPATIENT_CLINIC_OR_DEPARTMENT_OTHER): Payer: Self-pay

## 2023-09-17 ENCOUNTER — Emergency Department (HOSPITAL_BASED_OUTPATIENT_CLINIC_OR_DEPARTMENT_OTHER): Payer: Medicare PPO

## 2023-09-17 DIAGNOSIS — Z79899 Other long term (current) drug therapy: Secondary | ICD-10-CM | POA: Diagnosis not present

## 2023-09-17 DIAGNOSIS — I10 Essential (primary) hypertension: Secondary | ICD-10-CM | POA: Insufficient documentation

## 2023-09-17 DIAGNOSIS — H532 Diplopia: Secondary | ICD-10-CM | POA: Diagnosis present

## 2023-09-17 DIAGNOSIS — Z85048 Personal history of other malignant neoplasm of rectum, rectosigmoid junction, and anus: Secondary | ICD-10-CM | POA: Insufficient documentation

## 2023-09-17 DIAGNOSIS — Z7901 Long term (current) use of anticoagulants: Secondary | ICD-10-CM | POA: Insufficient documentation

## 2023-09-17 DIAGNOSIS — H4902 Third [oculomotor] nerve palsy, left eye: Secondary | ICD-10-CM | POA: Insufficient documentation

## 2023-09-17 DIAGNOSIS — Z794 Long term (current) use of insulin: Secondary | ICD-10-CM | POA: Diagnosis not present

## 2023-09-17 LAB — COMPREHENSIVE METABOLIC PANEL
ALT: 22 U/L (ref 0–44)
AST: 17 U/L (ref 15–41)
Albumin: 4.2 g/dL (ref 3.5–5.0)
Alkaline Phosphatase: 88 U/L (ref 38–126)
Anion gap: 11 (ref 5–15)
BUN: 20 mg/dL (ref 8–23)
CO2: 30 mmol/L (ref 22–32)
Calcium: 9.8 mg/dL (ref 8.9–10.3)
Chloride: 96 mmol/L — ABNORMAL LOW (ref 98–111)
Creatinine, Ser: 1.14 mg/dL — ABNORMAL HIGH (ref 0.44–1.00)
GFR, Estimated: 53 mL/min — ABNORMAL LOW (ref >60)
Glucose, Bld: 106 mg/dL — ABNORMAL HIGH (ref 70–99)
Potassium: 3.4 mmol/L — ABNORMAL LOW (ref 3.5–5.1)
Sodium: 137 mmol/L (ref 135–145)
Total Bilirubin: 0.7 mg/dL (ref <1.2)
Total Protein: 7.9 g/dL (ref 6.5–8.1)

## 2023-09-17 LAB — SEDIMENTATION RATE: Sed Rate: 55 mm/h — ABNORMAL HIGH (ref 0–22)

## 2023-09-17 LAB — CBC WITH DIFFERENTIAL/PLATELET
Abs Immature Granulocytes: 0.01 10*3/uL (ref 0.00–0.07)
Basophils Absolute: 0 10*3/uL (ref 0.0–0.1)
Basophils Relative: 0 %
Eosinophils Absolute: 0.1 10*3/uL (ref 0.0–0.5)
Eosinophils Relative: 1 %
HCT: 39.8 % (ref 36.0–46.0)
Hemoglobin: 13.4 g/dL (ref 12.0–15.0)
Immature Granulocytes: 0 %
Lymphocytes Relative: 37 %
Lymphs Abs: 2.2 10*3/uL (ref 0.7–4.0)
MCH: 30.8 pg (ref 26.0–34.0)
MCHC: 33.7 g/dL (ref 30.0–36.0)
MCV: 91.5 fL (ref 80.0–100.0)
Monocytes Absolute: 0.3 10*3/uL (ref 0.1–1.0)
Monocytes Relative: 5 %
Neutro Abs: 3.3 10*3/uL (ref 1.7–7.7)
Neutrophils Relative %: 57 %
Platelets: 375 10*3/uL (ref 150–400)
RBC: 4.35 MIL/uL (ref 3.87–5.11)
RDW: 13.1 % (ref 11.5–15.5)
WBC: 5.8 10*3/uL (ref 4.0–10.5)
nRBC: 0 % (ref 0.0–0.2)

## 2023-09-17 LAB — C-REACTIVE PROTEIN: CRP: 0.7 mg/dL (ref <1.0)

## 2023-09-17 MED ORDER — MORPHINE SULFATE (PF) 2 MG/ML IV SOLN
2.0000 mg | Freq: Once | INTRAVENOUS | Status: AC
  Administered 2023-09-17: 2 mg via INTRAVENOUS
  Filled 2023-09-17: qty 1

## 2023-09-17 MED ORDER — IOHEXOL 350 MG/ML SOLN
75.0000 mL | Freq: Once | INTRAVENOUS | Status: AC | PRN
  Administered 2023-09-17: 75 mL via INTRAVENOUS

## 2023-09-17 MED ORDER — SODIUM CHLORIDE 0.9 % IV BOLUS
1000.0000 mL | Freq: Once | INTRAVENOUS | Status: AC
  Administered 2023-09-17: 1000 mL via INTRAVENOUS

## 2023-09-17 NOTE — ED Notes (Signed)
[facility] called for patient transfer ED to ED to [facility] with [facility] MD accepting

## 2023-09-17 NOTE — ED Notes (Signed)
Pt. States she has abdominal pain that is coming and going. States it feels like her insides are twisting.

## 2023-09-17 NOTE — ED Provider Notes (Signed)
Physical Exam  BP (!) 138/90   Pulse 94   Temp 98.7 F (37.1 C) (Oral)   Resp 16   SpO2 97%   Physical Exam Vitals and nursing note reviewed.  Constitutional:      General: She is not in acute distress.    Appearance: Normal appearance.  HENT:     Head: Normocephalic and atraumatic.  Eyes:     General: No scleral icterus.       Right eye: No discharge.        Left eye: No discharge.     Conjunctiva/sclera: Conjunctivae normal.     Comments: Obvious amblyopia with left eye drift.  However was able to have a conjugate gaze upon focusing on my finger.  Cardiovascular:     Rate and Rhythm: Normal rate and regular rhythm.     Pulses: Normal pulses.     Heart sounds: Normal heart sounds. No murmur heard.    No friction rub. No gallop.  Pulmonary:     Effort: Pulmonary effort is normal. No respiratory distress.     Breath sounds: Normal breath sounds.  Abdominal:     General: Abdomen is flat.     Palpations: Abdomen is soft.     Tenderness: There is no abdominal tenderness.  Musculoskeletal:     Cervical back: Normal range of motion. No rigidity.  Skin:    General: Skin is warm and dry.  Neurological:     General: No focal deficit present.     Mental Status: She is alert. Mental status is at baseline.     Motor: No weakness.     Gait: Gait normal.  Psychiatric:        Mood and Affect: Mood normal.     Procedures  Procedures  ED Course / MDM   Clinical Course as of 09/17/23 2020  Sat Sep 17, 2023  1618 Re-evaluate: patient completed CT, pending interpretation. On re-examination, there is a definite abnormality of movement medially of the left eye. She reports pain with attempt. Was not seen on exam of eye movement initially.  [SU]    Clinical Course User Index [SU] Elpidio Anis, PA-C   Medical Decision Making Amount and/or Complexity of Data Reviewed Labs: ordered. Radiology: ordered.  Risk Prescription drug management.   This patient is a 67 year old  female who presents to the ED for concern of diplopia, blurry vision.  Previously seen at urgent care and sent to [facility] for further evaluation for MRI and neurology consult.   ED Course:  Patient arrived to the ED after being transferred from urgent care for MRI and neurology consult.  Upon evaluating patient , she did have obvious amblyopia in the R eye, however is able to have conjugate gaze upon focusing on an object. Patient states that blurry vision still present.  Stable vital signs.  Able to ambulate without difficulty.   Upon awaiting MRI, patient wanted to leave AMA due to length of time before MRI available.  Patient was explained the risks of leaving including discussing the signs and symptoms of stroke and other cranial abnormalities.  Patient was alert and oriented x 4 and had appropriate decision-making abilities.  Patient still wanted to leave after explanations of concerning pathology and need for MRI testing with follow-up neurology consult.  Patient stated that she was going to be seen somewhere else.  Despite further explanation patient continued to insist that she leave even trying to pull out her IV.  Patient was then  discharged AMA.   Disposition: After consideration of the diagnostic results and the patients response to treatment, I feel that the patient warranted further evaluation however left AMA.         Lunette Stands, [place] 09/17/23 2302    Eber Hong, MD 09/18/23 [phone]

## 2023-09-17 NOTE — ED Notes (Signed)
Pt upset about wait and requesting to leave AMA, provider discussed POC with her, reason for delay, risks of leaving, and benefits of staying; pt continues to want to leave

## 2023-09-17 NOTE — ED Triage Notes (Signed)
Pt c/o "seeing doubles" out of L eye. Associated HA "right behind that eye"  States she "woke up the other morning, thought it was vertigo"

## 2023-09-17 NOTE — ED Provider Notes (Cosign Needed)
[place] EMERGENCY DEPARTMENT AT [facility] Provider Note   CSN: [id number] Arrival date & time: 09/17/23  1326     History  Chief Complaint  Patient presents with   Diplopia   Dizziness    Alicia Martinez is a 67 y.o. female.  Patient to ED with symptoms described as double vision for the past 3 days. No headache, loss of vision, color changes or eye pain. She feels she cannot focus and sees 2 of everything. No nausea, vomiting. No unilateral weakness. No loss of balance when walking. She reports seeing her eye doctor within the last 2 months and no problems were identified.   The history is provided by the patient. No language interpreter was used.  Dizziness      Home Medications Prior to Admission medications   Medication Sig Start Date End Date Taking? Authorizing Provider  apixaban (ELIQUIS) 5 MG TABS tablet Take 1 tablet (5 mg total) by mouth 2 (two) times daily. Start taking after completion of starter pack. 06/20/23   Pervis Hocking B, RPH-CPP  atorvastatin (LIPITOR) 20 MG tablet TAKE 1 TABLET BY MOUTH EVERY DAY 08/04/23   Pricilla Riffle, MD  carboxymethylcellulose 1 % ophthalmic solution Apply 1 drop to eye 2 (two) times daily as needed (dry eyes).    [provider]  cyclobenzaprine (FLEXERIL) 10 MG tablet Take 5-10 mg by mouth at bedtime. 12/12/20   [provider]  diphenhydrAMINE (BENADRYL) 25 MG tablet Take 25 mg by mouth every 4 (four) hours as needed for itching or allergies.    [provider]  fluticasone (FLONASE) 50 MCG/ACT nasal spray Place 1 spray into both nostrils daily as needed for allergies or rhinitis.    [provider]  furosemide (LASIX) 20 MG tablet Take 20 mg by mouth daily. 02/17/18   [provider]  hydrochlorothiazide (HYDRODIURIL) 25 MG tablet Take 1 tablet (25 mg total) by mouth daily. 04/18/17   Ginger Carne, MD  loratadine (CLARITIN) 10 MG tablet Take 10 mg by mouth daily.     [provider]  meclizine (ANTIVERT) 25 MG tablet Take 1 tablet (25 mg total) by mouth 3 (three) times daily as needed for dizziness. 12/08/17   Vanetta Mulders, MD  metFORMIN (GLUCOPHAGE) 1000 MG tablet Take 1,000 mg by mouth 2 (two) times daily. 12/28/21   [provider]  naloxone [facility]) nasal spray 4 mg/0.1 mL as needed. 12/08/21   [provider]  NOVOLOG FLEXPEN 100 UNIT/ML FlexPen Inject 10 Units into the skin 3 (three) times daily with meals. 07/23/20   [provider]  Oxycodone HCl 10 MG TABS Take 10 mg by mouth every 8 (eight) hours as needed for pain. 03/09/18   [provider]  potassium chloride (K-DUR) 10 MEQ tablet Take 1 tablet (10 mEq total) by mouth 2 (two) times daily. Patient taking differently: Take 10 mEq by mouth daily. 03/13/18   Azalia Bilis, MD  Psyllium (METAMUCIL PO) Take 1 packet by mouth daily.    [provider]  TRESIBA FLEXTOUCH 100 UNIT/ML FlexTouch Pen Inject 10 Units into the skin at bedtime. 05/05/23   [provider]  valsartan (DIOVAN) 80 MG tablet Take 80 mg by mouth daily. 12/12/20   [provider]      Allergies    Asa [aspirin], Ibuprofen, and Other    Review of Systems   Review of Systems  Neurological:  Positive for dizziness.    Physical Exam Updated Vital Signs  BP 118/69   Pulse 91   Temp (!) 97.5 F (36.4 C)   Resp 16   SpO2 96%  Physical Exam Vitals and nursing note reviewed.  Constitutional:      Appearance: Normal appearance.  HENT:     Head: Normocephalic and atraumatic.  Eyes:     General: No visual field deficit.    Extraocular Movements: Extraocular movements intact.     Conjunctiva/sclera: Conjunctivae normal.     Pupils: Pupils are equal, round, and reactive to light.     Comments: Right eye covered - symptoms are much improved with some difficulty focusing. Left eye covered - symptoms resolved completely. Full range of extraocular motion without  limitation or discomfort. Conjugate gaze.   Cardiovascular:     Rate and Rhythm: Normal rate.  Pulmonary:     Effort: Pulmonary effort is normal.  Abdominal:     Palpations: Abdomen is soft.     Tenderness: There is no abdominal tenderness.  Neurological:     Mental Status: She is alert and oriented to person, place, and time.     GCS: GCS eye subscore is 4. GCS verbal subscore is 5. GCS motor subscore is 6.     Cranial Nerves: No dysarthria or facial asymmetry.     Sensory: Sensation is intact.     Motor: Motor function is intact. No weakness, abnormal muscle tone or pronator drift.     Coordination: Coordination is intact. Heel to Shin Test normal.     ED Results / Procedures / Treatments   Labs (all labs ordered are listed, but only abnormal results are displayed) Labs Reviewed  COMPREHENSIVE METABOLIC PANEL - Abnormal; Notable for the following components:      Result Value   Potassium 3.4 (*)    Chloride 96 (*)    Glucose, Bld 106 (*)    Creatinine, Ser 1.14 (*)    GFR, Estimated 53 (*)    All other components within normal limits  CBC WITH DIFFERENTIAL/PLATELET  SEDIMENTATION RATE  C-REACTIVE PROTEIN   Results for orders placed or performed during the hospital encounter of 09/17/23  CBC with Differential   Collection Time: 09/17/23  3:07 PM  Result Value Ref Range   WBC 5.8 4.0 - 10.5 K/uL   RBC 4.35 3.87 - 5.11 MIL/uL   Hemoglobin 13.4 12.0 - 15.0 g/dL   HCT 47.8 29.5 - 62.1 %   MCV 91.5 80.0 - 100.0 fL   MCH 30.8 26.0 - 34.0 pg   MCHC 33.7 30.0 - 36.0 g/dL   RDW 30.8 65.7 - 84.6 %   Platelets 375 150 - 400 K/uL   nRBC 0.0 0.0 - 0.2 %   Neutrophils Relative % 57 %   Neutro Abs 3.3 1.7 - 7.7 K/uL   Lymphocytes Relative 37 %   Lymphs Abs 2.2 0.7 - 4.0 K/uL   Monocytes Relative 5 %   Monocytes Absolute 0.3 0.1 - 1.0 K/uL   Eosinophils Relative 1 %   Eosinophils Absolute 0.1 0.0 - 0.5 K/uL   Basophils Relative 0 %   Basophils Absolute 0.0 0.0 - 0.1 K/uL    Immature Granulocytes 0 %   Abs Immature Granulocytes 0.01 0.00 - 0.07 K/uL  Comprehensive metabolic panel   Collection Time: 09/17/23  3:07 PM  Result Value Ref Range   Sodium 137 135 - 145 mmol/L   Potassium 3.4 (L) 3.5 - 5.1 mmol/L   Chloride 96 (L) 98 - 111 mmol/L   CO2  30 22 - 32 mmol/L   Glucose, Bld 106 (H) 70 - 99 mg/dL   BUN 20 8 - 23 mg/dL   Creatinine, Ser 1.91 (H) 0.44 - 1.00 mg/dL   Calcium 9.8 8.9 - 47.8 mg/dL   Total Protein 7.9 6.5 - 8.1 g/dL   Albumin 4.2 3.5 - 5.0 g/dL   AST 17 15 - 41 U/L   ALT 22 0 - 44 U/L   Alkaline Phosphatase 88 38 - 126 U/L   Total Bilirubin 0.7 <1.2 mg/dL   GFR, Estimated 53 (L) >60 mL/min   Anion gap 11 5 - 15    EKG None  Radiology CT Angio Head W or Wo Contrast Result Date: 09/17/2023 CLINICAL DATA:  Provided history: Binocular diplopia. Additional history provided: Double vision in left eye. Left-sided headache. EXAM: CT ANGIOGRAPHY HEAD TECHNIQUE: Multidetector CT imaging of the head was performed using the standard protocol during bolus administration of intravenous contrast. Multiplanar CT image reconstructions and MIPs were obtained to evaluate the vascular anatomy. RADIATION DOSE REDUCTION: This exam was performed according to the departmental dose-optimization program which includes automated exposure control, adjustment of the mA and/or kV according to patient size and/or use of iterative reconstruction technique. CONTRAST:  75mL OMNIPAQUE IOHEXOL 350 MG/ML SOLN COMPARISON:  Head CT 12/12/2020.  Brain MRI 12/08/2017. FINDINGS: CT HEAD Brain: Mild generalized cerebral volume loss. Partially empty sella turcica. There is no acute intracranial hemorrhage. No demarcated cortical infarct. No extra-axial fluid collection. No evidence of an intracranial mass. No midline shift. Vascular: No hyperdense vessel.  Atherosclerotic calcifications. Skull: No calvarial fracture or aggressive osseous lesion. Visible orbits/sinuses: No orbital mass or  acute orbital finding. No significant paranasal sinus disease. CTA HEAD Anterior circulation: The intracranial internal carotid arteries are patent. Nonstenotic atherosclerotic plaque within both vessels The M1 middle cerebral arteries are patent. No M2 proximal branch occlusion or high-grade proximal stenosis. The anterior cerebral arteries are patent. No intracranial aneurysm is identified. Posterior circulation: The intracranial vertebral arteries are patent. The basilar artery is patent. The posterior cerebral arteries are patent. A small right posterior communicating artery is present. The left posterior communicating artery is diminutive or absent. Venous sinuses: Within the limitations of contrast timing, no convincing thrombus. Anatomic variants: As described. Review of the MIP images confirms the above findings. IMPRESSION: Non-contrast head CT: 1. No evidence of an acute intracranial abnormality. 2. Partially empty sella turcica. This finding can reflect incidental anatomic variation, or alternatively, it can be associated with chronic idiopathic intracranial hypertension (pseudotumor cerebri). 3. Mild generalized cerebral volume loss. CTA head: 1. No proximal intracranial large vessel occlusion or high-grade proximal arterial stenosis identified. 2. Non-stenotic atherosclerotic plaque within the intracranial internal carotid arteries. Electronically Signed   By: Jackey Loge D.O.   On: 09/17/2023 17:11    Procedures .Critical Care  Performed by: Elpidio Anis, PA-C Authorized by: Elpidio Anis, PA-C   Critical care provider statement:    Critical care time (minutes):  30   Critical care was necessary to treat or prevent imminent or life-threatening deterioration of the following conditions:  CNS failure or compromise   Critical care was time spent personally by me on the following activities:  Examination of patient, discussions with consultants, ordering and review of laboratory studies,  ordering and review of radiographic studies and re-evaluation of patient's condition     Medications Ordered in ED Medications  sodium chloride 0.9 % bolus 1,000 mL (has no administration in time range)  iohexol (OMNIPAQUE) 350  MG/ML injection 75 mL (75 mLs Intravenous Contrast Given 09/17/23 1559)    ED Course/ Medical Decision Making/ A&P Clinical Course as of 09/17/23 1729  Sat Sep 17, 2023  1618 Re-evaluate: patient completed CT, pending interpretation. On re-examination, there is a definite abnormality of movement medially of the left eye. She reports pain with attempt. Was not seen on exam of eye movement initially.  [SU]    Clinical Course User Index [SU] Elpidio Anis, PA-C                                 Medical Decision Making This patient presents to the ED for concern of diplopia, this involves an extensive number of treatment options, and is a complaint that carries with it a high risk of complications and morbidity.  The differential diagnosis includes 3rd nerve palsy, aneurysm, arterial occlusion or dissection, intraocular derangement   Co morbidities that complicate the patient evaluation  DVT/PE on Eliquis, adenocarcinoma of rectum, HTN, diverticulitis   Additional history obtained:  Additional history and/or information obtained from chart review, notable for EMR   Lab Tests:  I Ordered, and personally interpreted labs.  The pertinent results include:  Cr 1.14, glucose 106, chloride 96; no leukocytosis, normal hgb    Imaging Studies ordered:  I ordered imaging studies including CT and CTA head: IMPRESSION: Non-contrast head CT:   1. No evidence of an acute intracranial abnormality. 2. Partially empty sella turcica. This finding can reflect incidental anatomic variation, or alternatively, it can be associated with chronic idiopathic intracranial hypertension (pseudotumor cerebri). 3. Mild generalized cerebral volume loss.   CTA head:   1. No  proximal intracranial large vessel occlusion or high-grade proximal arterial stenosis identified. 2. Non-stenotic atherosclerotic plaque within the intracranial internal carotid arteries.    Test Considered:  CT and CTA head ordered She will need MRI brain, orbits She will need neuro consult    Consultations Obtained:  I requested consultation with the Dr. Selina Cooley, neuro,  and discussed lab and imaging findings as well as pertinent plan - they recommend: labs (ESR, CRP) added; MRI brain w/ and w/o, orbits w and w/o ordered. She will see when MRI's compete   Problem List / ED Course:  Patient with 3 days of diplopia Binocular per exam in ED Deficit is Adduction of left eye noted on exam CTA head without acute finding    Social Determinants of Health:  Good family support   Disposition:  After consideration of the diagnostic results and the patients response to treatment, I feel that the patient would benefit from Transfer to [facility] for MRI, neuro consultation.   Amount and/or Complexity of Data Reviewed Labs: ordered. Radiology: ordered.  Risk Prescription drug management.           Final Clinical Impression(s) / ED Diagnoses Final diagnoses:  Third nerve palsy of left eye    Rx / DC Orders ED Discharge Orders     None         Elpidio Anis, PA-C 09/17/23 1729

## 2023-09-18 ENCOUNTER — Other Ambulatory Visit: Payer: Self-pay

## 2023-09-18 ENCOUNTER — Encounter (HOSPITAL_COMMUNITY): Payer: Self-pay

## 2023-09-18 ENCOUNTER — Emergency Department (HOSPITAL_COMMUNITY): Payer: Medicare PPO

## 2023-09-18 ENCOUNTER — Emergency Department (HOSPITAL_COMMUNITY)
Admission: EM | Admit: 2023-09-18 | Discharge: 2023-09-18 | Disposition: A | Discharge status: Home / Self Care | Payer: Medicare PPO | Attending: Emergency Medicine | Admitting: Emergency Medicine

## 2023-09-18 DIAGNOSIS — Z85048 Personal history of other malignant neoplasm of rectum, rectosigmoid junction, and anus: Secondary | ICD-10-CM | POA: Insufficient documentation

## 2023-09-18 DIAGNOSIS — R519 Headache, unspecified: Secondary | ICD-10-CM | POA: Diagnosis not present

## 2023-09-18 DIAGNOSIS — Z79899 Other long term (current) drug therapy: Secondary | ICD-10-CM | POA: Diagnosis not present

## 2023-09-18 DIAGNOSIS — E119 Type 2 diabetes mellitus without complications: Secondary | ICD-10-CM | POA: Insufficient documentation

## 2023-09-18 DIAGNOSIS — I10 Essential (primary) hypertension: Secondary | ICD-10-CM | POA: Diagnosis not present

## 2023-09-18 DIAGNOSIS — Z7984 Long term (current) use of oral hypoglycemic drugs: Secondary | ICD-10-CM | POA: Insufficient documentation

## 2023-09-18 DIAGNOSIS — H532 Diplopia: Secondary | ICD-10-CM | POA: Insufficient documentation

## 2023-09-18 DIAGNOSIS — Z794 Long term (current) use of insulin: Secondary | ICD-10-CM | POA: Diagnosis not present

## 2023-09-18 DIAGNOSIS — Z7901 Long term (current) use of anticoagulants: Secondary | ICD-10-CM | POA: Insufficient documentation

## 2023-09-18 MED ORDER — ACETAZOLAMIDE 250 MG PO TABS: 500.0000 mg | ORAL_TABLET | Freq: Two times a day (BID) | ORAL | 0 refills | Status: DC

## 2023-09-18 MED ORDER — GADOBUTROL 1 MMOL/ML IV SOLN
10.0000 mL | Freq: Once | INTRAVENOUS | Status: AC | PRN
  Administered 2023-09-18: 10 mL via INTRAVENOUS

## 2023-09-18 MED ORDER — TOPIRAMATE 50 MG PO TABS: 50.0000 mg | ORAL_TABLET | Freq: Every day | ORAL | 0 refills | Status: DC

## 2023-09-18 NOTE — Discharge Instructions (Addendum)
Take the new medicine.  Follow-up with the eye doctor and neurology.  Return for worsening symptoms or new neurologic deficits.

## 2023-09-18 NOTE — ED Triage Notes (Signed)
Pt arrives via POV. Pt reports she was seen at [facility] yesterday for double vision and pain behind her right eye. Pt was sent to [facility] to have an MRI secondary to 3rd nerve palsy.  pt left prior to the mri due to the wait time. Pt reports pain and double vision has not improved. Pt AxOx4. Denies any other associated symptoms at this time.

## 2023-09-18 NOTE — ED Provider Notes (Signed)
[place] EMERGENCY DEPARTMENT AT [facility] Provider Note   CSN: [id number] Arrival date & time: 09/18/23  1437     History  Chief Complaint  Patient presents with   Headache   Diplopia    Alicia Martinez is a 67 y.o. female.   Headache Patient resents with vision changes.  Has had for the last few days now.  Double vision.  Does have a fullness in her right eye.  Had been seen yesterday at [facility].  Had a head CT that showed partially empty sella.  Had gone to [facility] for MRI but left due to the wait.  Still having symptoms.  I reviewed note from previous visit.  At that potentially left eye findings.  No other numbness or weakness.  Is on anticoagulation for previous DVT.    Past Medical History:  Diagnosis Date   Allergy    Anemia    Arthritis    DJD, ARTHRITIS   Carotid artery plaque 08/30/2013   PER CAROTID DOPPLER STUDY   Colon polyps    Deep vein thrombosis (HCC)    left lower extremity   Diabetes (HCC)    Diverticulitis large intestine    Heart palpitations    HX OF PALPITATIONS AND SOME DIZZINESS - EVALUATED BY CARDIOLOGIST DR. ROSS - OFFICE NOTES IS EPIC 08/24/13 - "EVENT MONITOR SHOWED NO SIGNIFICANT ARRHYTHMIA"   History of rectal cancer    T1N0 resected 08/2009   Hypertension    Infectious colitis    Obesity    Pain    LOWER BACK AND RT KNEE AND BOTH SHOULDERS - LEFT SHOULDER PAIN WORSE--PT TOLD ARTHRITIS   Pulmonary embolism (HCC)    LAST ONE WAS 1995- ON CHRONIC COUMADIN   Rectal cancer (HCC)    Sleep apnea 08/09/2012   wears CPAP    Home Medications Prior to Admission medications   Medication Sig Start Date End Date Taking? Authorizing Provider  acetaZOLAMIDE (DIAMOX) 250 MG tablet Take 2 tablets (500 mg total) by mouth 2 (two) times daily. 09/18/23  Yes Benjiman Core, MD  apixaban (ELIQUIS) 5 MG TABS tablet Take 1 tablet (5 mg total) by mouth 2 (two) times daily. Start taking after completion of starter pack.  06/20/23  Yes Yates, Madison B, RPH-CPP  atorvastatin (LIPITOR) 20 MG tablet TAKE 1 TABLET BY MOUTH EVERY DAY Patient taking differently: Take 20 mg by mouth at bedtime. 08/04/23  Yes Pricilla Riffle, MD  carboxymethylcellulose 1 % ophthalmic solution Apply 1 drop to eye 2 (two) times daily as needed (dry eyes).   Yes [provider]  cyclobenzaprine (FLEXERIL) 10 MG tablet Take 5-10 mg by mouth at bedtime as needed (sleep). 12/12/20  Yes [provider]  diphenhydrAMINE (BENADRYL) 25 MG tablet Take 25 mg by mouth every 4 (four) hours as needed for itching or allergies.   Yes [provider]  furosemide (LASIX) 20 MG tablet Take 20 mg by mouth every other day. 02/17/18  Yes [provider]  hydrochlorothiazide (HYDRODIURIL) 25 MG tablet Take 1 tablet (25 mg total) by mouth daily. 04/18/17  Yes Ginger Carne, MD  metFORMIN (GLUCOPHAGE) 1000 MG tablet Take 1,000 mg by mouth 2 (two) times daily. 12/28/21  Yes [provider]  NOVOLOG FLEXPEN 100 UNIT/ML FlexPen Inject 10-15 Units into the skin 3 (three) times daily with meals. 07/23/20  Yes [provider]  Oxycodone HCl 10 MG TABS Take 10 mg by mouth every 8 (eight) hours as needed for  pain. 03/09/18  Yes [provider]  potassium chloride (K-DUR) 10 MEQ tablet Take 1 tablet (10 mEq total) by mouth 2 (two) times daily. Patient taking differently: Take 10 mEq by mouth daily. 03/13/18  Yes Azalia Bilis, MD  topiramate (TOPAMAX) 50 MG tablet Take 1 tablet (50 mg total) by mouth at bedtime. 09/18/23  Yes Benjiman Core, MD  TRESIBA FLEXTOUCH 100 UNIT/ML FlexTouch Pen Inject 10 Units into the skin at bedtime. 05/05/23  Yes [provider]  valsartan (DIOVAN) 80 MG tablet Take 80 mg by mouth daily. 12/12/20  Yes [provider]  fluticasone (FLONASE) 50 MCG/ACT nasal spray Place 1 spray into both nostrils daily as needed for allergies or rhinitis. Patient not taking: Reported on  09/18/2023    [provider]  loratadine (CLARITIN) 10 MG tablet Take 10 mg by mouth daily. Patient not taking: Reported on 09/18/2023    [provider]  meclizine (ANTIVERT) 25 MG tablet Take 1 tablet (25 mg total) by mouth 3 (three) times daily as needed for dizziness. Patient not taking: Reported on 09/18/2023 12/08/17   Vanetta Mulders, MD  naloxone [facility]) nasal spray 4 mg/0.1 mL Place 1 spray into the nose once. Patient not taking: Reported on 09/18/2023 12/08/21   [provider]  Psyllium (METAMUCIL PO) Take 1 packet by mouth daily. Patient not taking: Reported on 09/18/2023    [provider]      Allergies    Asa [aspirin], Ibuprofen, and Other    Review of Systems   Review of Systems  Neurological:  Positive for headaches.    Physical Exam Updated Vital Signs BP 120/70   Pulse 95   Temp 98.8 F (37.1 C) (Oral)   Resp 17   Ht 5\' 6"  (1.676 m)   Wt 106.6 kg   SpO2 100%   BMI 37.93 kg/m  Physical Exam Vitals and nursing note reviewed.  Constitutional:      Appearance: She is well-developed.  Eyes:     Pupils: Pupils are equal, round, and reactive to light.     Comments: Somewhat disconjugate gaze.  Left eye has normal range of motion.  The right eye will not go fully lateral.  However does have good range superiorly inferiorly and medially.  At rest the right eye is mostly straightahead.  Skin:    General: Skin is warm.  Neurological:     Mental Status: She is alert.     ED Results / Procedures / Treatments   Labs (all labs ordered are listed, but only abnormal results are displayed) Labs Reviewed - No data to display  EKG None  Radiology MR Brain W and Wo Contrast Result Date: 09/18/2023 CLINICAL DATA:  Provided history: Neuro deficit, acute, stroke suspected. Double vision. EXAM: MRI HEAD AND ORBITS WITHOUT AND WITH CONTRAST TECHNIQUE: Multiplanar, multiecho pulse sequences of the brain and surrounding structures  were obtained without and with intravenous contrast. Multiplanar, multiecho pulse sequences of the orbits and surrounding structures were obtained including fat saturation techniques, before and after intravenous contrast administration. CONTRAST:  10mL GADAVIST GADOBUTROL 1 MMOL/ML IV SOLN COMPARISON:  Non-contrast head CT and CT angiogram head 09/17/2023. Brain MRI 12/08/2017. FINDINGS: MRI HEAD FINDINGS Brain: No age advanced or lobar predominant parenchymal atrophy. 6 mm dural-based enhancing mass overlying the mid right frontal lobe most consistent with a meningioma (for instance as seen on series 17, image 20) (series 16, image 137). In retrospect, this finding was present on the prior brain MRI  of 12/08/2017 but is seen to better advantage on today's contrast enhanced examination. No significant mass effect upon the underlying brain parenchyma. No underlying parenchymal edema. 3 mm somewhat rounded focus of enhancement along the left aspect of the anterior falx, which may reflect venous enhancement or an additional small meningioma (series 16, image 122). Partially empty sella turcica. No cortical encephalomalacia is identified. No significant cerebral white matter disease for age. There is no acute infarct. No chronic intracranial blood products. No extra-axial fluid collection. No midline shift. Vascular: Maintained flow voids within the proximal large arterial vessels. Skull and upper cervical spine: No focal worrisome marrow lesion. Other: 3.4 x 1.9 cm ovoid T2 hyperintense and T1 hypointense focus within/along the inferior aspect of the left parotid gland (for instance as seen on series 15, image 16) (series 7, image 22). MRI ORBITS FINDINGS Intermittently motion degraded examination (with up to moderate motion degradation of the part sequences). Within this limitation, findings are as follows. Orbit: The globes are normal in size and contour. The extraocular muscles, optic nerve sheath complexes and  lacrimal glands are symmetric and unremarkable. No orbital mass or pathologic orbital enhancement. Visualized sinuses: Mild mucosal thickening scattered within bilateral ethmoid air cells. Soft tissues: Please see the description of a lesion within/along the inferior aspect of the left parotid gland above. The visible maxillofacial and upper neck soft tissues are otherwise unremarkable. IMPRESSION: MRI brain: 1. No evidence of an acute infarct. 2. 6 mm dural-based enhancing mass overlying the mid right frontal lobe most consistent with a meningioma. No significant mass effect upon the underlying brain parenchyma. No underlying parenchymal edema. 3. 3 mm somewhat rounded focus of enhancement along the left aspect of the anterior falx, which may reflect venous enhancement or an additional small meningioma 4. Partially empty sella turcica. This finding can reflect incidental anatomic variation, or alternatively, it can be associated with chronic idiopathic intracranial hypertension (pseudotumor cerebri). 5. 3.4 x 1.9 cm ovoid lesion within/along the inferior aspect of the left parotid gland, which may reflect a primary parotid neoplasm or an enlarged and pathologic lymph node. A contrast-enhanced neck CT is recommended for further evaluation. MRI orbits: 1. Intermittently motion degraded examination. Within this limitation, unremarkable MRI appearance of the orbits. 2. Mild mucosal thickening within the bilateral ethmoid sinuses. Electronically Signed   By: Jackey Loge D.O.   On: 09/18/2023 19:05   MR ORBITS W WO CONTRAST Result Date: 09/18/2023 CLINICAL DATA:  Provided history: Neuro deficit, acute, stroke suspected. Double vision. EXAM: MRI HEAD AND ORBITS WITHOUT AND WITH CONTRAST TECHNIQUE: Multiplanar, multiecho pulse sequences of the brain and surrounding structures were obtained without and with intravenous contrast. Multiplanar, multiecho pulse sequences of the orbits and surrounding structures were  obtained including fat saturation techniques, before and after intravenous contrast administration. CONTRAST:  10mL GADAVIST GADOBUTROL 1 MMOL/ML IV SOLN COMPARISON:  Non-contrast head CT and CT angiogram head 09/17/2023. Brain MRI 12/08/2017. FINDINGS: MRI HEAD FINDINGS Brain: No age advanced or lobar predominant parenchymal atrophy. 6 mm dural-based enhancing mass overlying the mid right frontal lobe most consistent with a meningioma (for instance as seen on series 17, image 20) (series 16, image 137). In retrospect, this finding was present on the prior brain MRI of 12/08/2017 but is seen to better advantage on today's contrast enhanced examination. No significant mass effect upon the underlying brain parenchyma. No underlying parenchymal edema. 3 mm somewhat rounded focus of enhancement along the left aspect of the anterior falx, which may reflect venous  enhancement or an additional small meningioma (series 16, image 122). Partially empty sella turcica. No cortical encephalomalacia is identified. No significant cerebral white matter disease for age. There is no acute infarct. No chronic intracranial blood products. No extra-axial fluid collection. No midline shift. Vascular: Maintained flow voids within the proximal large arterial vessels. Skull and upper cervical spine: No focal worrisome marrow lesion. Other: 3.4 x 1.9 cm ovoid T2 hyperintense and T1 hypointense focus within/along the inferior aspect of the left parotid gland (for instance as seen on series 15, image 16) (series 7, image 22). MRI ORBITS FINDINGS Intermittently motion degraded examination (with up to moderate motion degradation of the part sequences). Within this limitation, findings are as follows. Orbit: The globes are normal in size and contour. The extraocular muscles, optic nerve sheath complexes and lacrimal glands are symmetric and unremarkable. No orbital mass or pathologic orbital enhancement. Visualized sinuses: Mild mucosal  thickening scattered within bilateral ethmoid air cells. Soft tissues: Please see the description of a lesion within/along the inferior aspect of the left parotid gland above. The visible maxillofacial and upper neck soft tissues are otherwise unremarkable. IMPRESSION: MRI brain: 1. No evidence of an acute infarct. 2. 6 mm dural-based enhancing mass overlying the mid right frontal lobe most consistent with a meningioma. No significant mass effect upon the underlying brain parenchyma. No underlying parenchymal edema. 3. 3 mm somewhat rounded focus of enhancement along the left aspect of the anterior falx, which may reflect venous enhancement or an additional small meningioma 4. Partially empty sella turcica. This finding can reflect incidental anatomic variation, or alternatively, it can be associated with chronic idiopathic intracranial hypertension (pseudotumor cerebri). 5. 3.4 x 1.9 cm ovoid lesion within/along the inferior aspect of the left parotid gland, which may reflect a primary parotid neoplasm or an enlarged and pathologic lymph node. A contrast-enhanced neck CT is recommended for further evaluation. MRI orbits: 1. Intermittently motion degraded examination. Within this limitation, unremarkable MRI appearance of the orbits. 2. Mild mucosal thickening within the bilateral ethmoid sinuses. Electronically Signed   By: Jackey Loge D.O.   On: 09/18/2023 19:05   CT Angio Head W or Wo Contrast Result Date: 09/17/2023 CLINICAL DATA:  Provided history: Binocular diplopia. Additional history provided: Double vision in left eye. Left-sided headache. EXAM: CT ANGIOGRAPHY HEAD TECHNIQUE: Multidetector CT imaging of the head was performed using the standard protocol during bolus administration of intravenous contrast. Multiplanar CT image reconstructions and MIPs were obtained to evaluate the vascular anatomy. RADIATION DOSE REDUCTION: This exam was performed according to the departmental dose-optimization program  which includes automated exposure control, adjustment of the mA and/or kV according to patient size and/or use of iterative reconstruction technique. CONTRAST:  75mL OMNIPAQUE IOHEXOL 350 MG/ML SOLN COMPARISON:  Head CT 12/12/2020.  Brain MRI 12/08/2017. FINDINGS: CT HEAD Brain: Mild generalized cerebral volume loss. Partially empty sella turcica. There is no acute intracranial hemorrhage. No demarcated cortical infarct. No extra-axial fluid collection. No evidence of an intracranial mass. No midline shift. Vascular: No hyperdense vessel.  Atherosclerotic calcifications. Skull: No calvarial fracture or aggressive osseous lesion. Visible orbits/sinuses: No orbital mass or acute orbital finding. No significant paranasal sinus disease. CTA HEAD Anterior circulation: The intracranial internal carotid arteries are patent. Nonstenotic atherosclerotic plaque within both vessels The M1 middle cerebral arteries are patent. No M2 proximal branch occlusion or high-grade proximal stenosis. The anterior cerebral arteries are patent. No intracranial aneurysm is identified. Posterior circulation: The intracranial vertebral arteries are patent. The basilar artery  is patent. The posterior cerebral arteries are patent. A small right posterior communicating artery is present. The left posterior communicating artery is diminutive or absent. Venous sinuses: Within the limitations of contrast timing, no convincing thrombus. Anatomic variants: As described. Review of the MIP images confirms the above findings. IMPRESSION: Non-contrast head CT: 1. No evidence of an acute intracranial abnormality. 2. Partially empty sella turcica. This finding can reflect incidental anatomic variation, or alternatively, it can be associated with chronic idiopathic intracranial hypertension (pseudotumor cerebri). 3. Mild generalized cerebral volume loss. CTA head: 1. No proximal intracranial large vessel occlusion or high-grade proximal arterial stenosis  identified. 2. Non-stenotic atherosclerotic plaque within the intracranial internal carotid arteries. Electronically Signed   By: Jackey Loge D.O.   On: 09/17/2023 17:11    Procedures Procedures    Medications Ordered in ED Medications  gadobutrol (GADAVIST) 1 MMOL/ML injection 10 mL (10 mLs Intravenous Contrast Given 09/18/23 1727)    ED Course/ Medical Decision Making/ A&P                                 Medical Decision Making Amount and/or Complexity of Data Reviewed Radiology: ordered.  Risk Prescription drug management.   Patient with disconjugate gaze.  Appears to have lost some of the lateral movement of the right eye.  Discussed with Dr. Selina Cooley from neurology.  Will get MRI with and without contrast of both the brain and orbits.  Not a TNK candidate due to time of onset.  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  However cannot get LP at this time due to anticoagulation.  Discussed with Sal from neurology.  Will treat as IIH.  MRI does show meningiomas which are likely incidental.  Will have patient follow-up with neurology and ophthalmology.  Will start Diamox and Topamax.  Of note did have a swollen lymph node versus parotid mass on the scan.  Will need outpatient CT.  Discussed with patient.  Can follow with PCP.        Final Clinical Impression(s) / ED Diagnoses Final diagnoses:  Diplopia    Rx / DC Orders ED Discharge Orders          Ordered    acetaZOLAMIDE (DIAMOX) 250 MG tablet  2 times daily        09/18/23 2013    topiramate (TOPAMAX) 50 MG tablet  Daily at bedtime        09/18/23 2013    Ambulatory referral to Neurology       Comments: An appointment is requested in approximately: 1 week   09/18/23 2014              Benjiman Core, MD 09/18/23 2037

## 2023-10-03 NOTE — Telephone Encounter (Signed)
 error

## 2023-10-25 ENCOUNTER — Ambulatory Visit: Payer: Medicare HMO | Admitting: Neurology

## 2023-12-23 ENCOUNTER — Ambulatory Visit: Payer: Medicare HMO | Admitting: Diagnostic Neuroimaging

## 2023-12-27 ENCOUNTER — Encounter: Payer: Self-pay | Admitting: Podiatry

## 2023-12-27 ENCOUNTER — Ambulatory Visit (INDEPENDENT_AMBULATORY_CARE_PROVIDER_SITE_OTHER): Admitting: Podiatry

## 2023-12-27 DIAGNOSIS — M79675 Pain in left toe(s): Secondary | ICD-10-CM

## 2023-12-27 DIAGNOSIS — E119 Type 2 diabetes mellitus without complications: Secondary | ICD-10-CM | POA: Diagnosis not present

## 2023-12-27 DIAGNOSIS — B351 Tinea unguium: Secondary | ICD-10-CM | POA: Diagnosis not present

## 2023-12-27 DIAGNOSIS — M79674 Pain in right toe(s): Secondary | ICD-10-CM | POA: Diagnosis not present

## 2023-12-27 NOTE — Progress Notes (Signed)
 This patient returns to my office for at risk foot care.  This patient requires this care by a professional since this patient will be at risk due to having type 2 diabetes.  This patient is unable to cut nails herself since the patient cannot reach her nails.These nails are painful walking and wearing shoes.  This patient presents for at risk foot care today.  General Appearance  Alert, conversant and in no acute stress.  Vascular  Dorsalis pedis and posterior tibial  pulses are palpable  bilaterally.  Capillary return is within normal limits  bilaterally. Temperature is within normal limits  bilaterally.  Neurologic  Senn-Weinstein monofilament wire test within normal limits  bilaterally. Muscle power within normal limits bilaterally.  Nails Thick disfigured discolored nails with subungual debris  from hallux to fifth toes bilaterally. No evidence of bacterial infection or drainage bilaterally.  Orthopedic  No limitations of motion  feet .  No crepitus or effusions noted.  No bony pathology or digital deformities noted.  Skin  normotropic skin with no porokeratosis noted bilaterally.  No signs of infections or ulcers noted.     Onychomycosis  Pain in right toes  Pain in left toes  Consent was obtained for treatment procedures.   Mechanical debridement of nails 1-5  bilaterally performed with a nail nipper.  Filed with dremel without incident.    Return office visit                     Told patient to return for periodic foot care and evaluation due to potential at risk complications.   Helane Gunther DPM

## 2024-02-01 ENCOUNTER — Other Ambulatory Visit: Payer: Self-pay | Admitting: Internal Medicine

## 2024-02-01 DIAGNOSIS — Z79899 Other long term (current) drug therapy: Secondary | ICD-10-CM

## 2024-02-06 ENCOUNTER — Encounter: Payer: Self-pay | Admitting: Neurology

## 2024-02-06 ENCOUNTER — Ambulatory Visit (INDEPENDENT_AMBULATORY_CARE_PROVIDER_SITE_OTHER): Payer: Medicare HMO | Admitting: Neurology

## 2024-02-06 VITALS — BP 109/69 | HR 77 | Ht 66.0 in | Wt 236.0 lb

## 2024-02-06 DIAGNOSIS — H532 Diplopia: Secondary | ICD-10-CM | POA: Insufficient documentation

## 2024-02-06 NOTE — Progress Notes (Unsigned)
 Chief Complaint  Patient presents with   New Patient (Initial Visit)    Rm12, alone,  NP/internal ED referral for possible IIH: intermittent episodes of blurry vision      ASSESSMENT AND PLAN  Alicia Martinez is a 68 y.o. female    Transient horizontal diplopia History of DVT, PE  Is on Eliquis  treatment already  Multiple vascular risk factors including hypertension, diabetes, obesity obstructive sleep apnea, smoker,  Differentiation diagnosis include cranial 6th nerve palsy that has recovered with time  Laboratory evaluation including acetylcholine receptor antibody, thyroid  function to rule out neuromuscular junctional disorder, thyroid  malfunction,    If laboratory evaluation showed no significant abnormality, she is already on anticoagulation, she is to continue follow-up with her primary care and the ophthalmologist  Return if symptoms worsen or fail to improve.     DIAGNOSTIC DATA (LABS, IMAGING, TESTING) - I reviewed patient records, labs, notes, testing and imaging myself where available.   MEDICAL HISTORY:  Alicia Martinez is a 68 year old female, seen in request by her primary care doctor Delbert Feathers, for evaluation of transient double vision  History is obtained from the patient and review of electronic medical records. I personally reviewed pertinent available imaging films in PACS.   PMHx of  Obesity DM HTN PE-on Eliquis  History of rectal cancer in Dec 2010, s/p resection OSA-on CPAP Smoke 1/2 PPD  She was treated at emergency room December 2024 for acute onset of horizontal double vision, she described no matter which direction she looks, she has two imaging side-by-side, symptoms last for 3 months, eventually went away  During the process she was seen by ophthalmologist, I do not have the report  Personally reviewed MRI orbits, brain with without contrast September 18, 2023, no acute abnormality, 6 mm right frontal meningioma, partial  empty sella, left parotid gland ovoid lesion, 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 Positive for extensive nonocclusive left lower extremity deep venous thrombosis. 2. Occlusive superficial venous thrombosis at the medial aspect of the distal thigh.  PHYSICAL EXAM:   Vitals:   02/06/24 1553  BP: 109/69  Pulse: 77  Weight: 236 lb (107 kg)  Height: 5\' 6"  (1.676 m)     Body mass index is 38.09 kg/m.  PHYSICAL EXAMNIATION:  Gen: NAD, conversant, well nourised, well groomed                     Cardiovascular: Regular rate rhythm, no peripheral edema, warm, nontender. Eyes: Conjunctivae clear without exudates or hemorrhage Neck: Supple, no carotid bruits. Pulmonary: Clear to auscultation bilaterally   NEUROLOGICAL EXAM:  MENTAL STATUS: Speech/cognition: Awake, alert, oriented to history taking and casual conversation CRANIAL NERVES: CN II: Visual fields are full to confrontation. Pupils are round equal and briskly reactive to light. CN [name], IV, VI: extraocular movement are normal. No ptosis. CN V: Facial sensation is intact to light touch CN VII: Face is symmetric with normal eye closure  CN VIII: Hearing is normal to causal conversation. CN IX, X: Phonation is normal. CN XI: Head turning and shoulder shrug are intact  MOTOR: There is no pronator drift of out-stretched arms. Muscle bulk and tone are  normal. Muscle strength is normal.  REFLEXES: Reflexes are 1 and symmetric at the biceps, triceps, knees, and ankles. Plantar responses are flexor.  SENSORY: Mildly length dependent sensory changes,   COORDINATION: There is no trunk or limb dysmetria  noted.  GAIT/STANCE: push up, antalgic  REVIEW OF SYSTEMS:  Full 14 system review of systems performed and notable only for as above All other review of systems were negative.   ALLERGIES: Allergies  Allergen Reactions   Asa [Aspirin ]     REACTION: can not take due to blood thinner   Ibuprofen     Can't take due to taking blood thinners   Other     SOME ADHESIVES CAUSE SKIN IRRITATION  - STATES PAPER TAPE OK.  STATES SOME EKG ELECTRODES AND SOME SKIN PATCHES ALSO CAUSE IRRITATION    HOME MEDICATIONS: Current Outpatient Medications  Medication Sig Dispense Refill   apixaban  (ELIQUIS ) 5 MG TABS tablet Take 1 tablet (5 mg total) by mouth 2 (two) times daily. Start taking after completion of starter pack. 60 tablet 5   atorvastatin  (LIPITOR) 20 MG tablet TAKE 1 TABLET BY MOUTH EVERY DAY 30 tablet 0   carboxymethylcellulose 1 % ophthalmic solution Apply 1 drop to eye 2 (two) times daily as needed (dry eyes).     cyclobenzaprine (FLEXERIL) 10 MG tablet Take 5-10 mg by mouth at bedtime as needed (sleep).     diphenhydrAMINE  (BENADRYL ) 25 MG tablet Take 25 mg by mouth every 4 (four) hours as needed for itching or allergies.     fluticasone (FLONASE) 50 MCG/ACT nasal spray Place 1 spray into both nostrils daily as needed for allergies or rhinitis.     furosemide  (LASIX ) 20 MG tablet Take 20 mg by mouth every other day.  5   hydrochlorothiazide  (HYDRODIURIL ) 25 MG tablet Take 1 tablet (25 mg total) by mouth daily. 90 tablet 3   loratadine (CLARITIN) 10 MG tablet Take 10 mg by mouth daily.     meclizine  (ANTIVERT ) 25 MG tablet Take 1 tablet (25 mg total) by mouth 3 (three) times daily as needed for dizziness. 30 tablet 0   metFORMIN  (GLUCOPHAGE ) 1000 MG tablet Take 1,000 mg by mouth 2 (two) times daily.     naloxone (NARCAN) nasal spray 4 mg/0.1 mL Place 1 spray into the nose once.     NOVOLOG  FLEXPEN 100 UNIT/ML FlexPen Inject 10-15 Units into the skin 3 (three) times daily with meals.      Oxycodone  HCl 10 MG TABS Take 10 mg by mouth every 8 (eight) hours as needed for pain.  0   potassium chloride  (K-DUR) 10 MEQ tablet Take 1 tablet (10 mEq total) by mouth 2 (two) times daily. (Patient taking differently: Take 10 mEq by mouth daily.) 8 tablet 0   Psyllium (METAMUCIL PO) Take 1 packet by mouth daily.     TRESIBA FLEXTOUCH 100 UNIT/ML FlexTouch Pen Inject 10 Units into the skin at bedtime.     valsartan (DIOVAN) 80 MG tablet Take 80 mg by mouth daily.     No current facility-administered medications for this visit.    PAST MEDICAL HISTORY: Past Medical History:  Diagnosis Date   Allergy    Anemia    Arthritis    DJD, ARTHRITIS   Carotid artery plaque 08/30/2013   PER CAROTID DOPPLER STUDY   Colon polyps    Deep vein thrombosis (HCC)    left lower extremity   Diabetes (HCC)    Diverticulitis large intestine  Heart palpitations    HX OF PALPITATIONS AND SOME DIZZINESS - EVALUATED BY CARDIOLOGIST DR. ROSS - OFFICE NOTES IS EPIC 08/24/13 - "EVENT MONITOR SHOWED NO SIGNIFICANT ARRHYTHMIA"   History of rectal cancer    T1N0 resected 08/2009   Hypertension    Infectious colitis    Obesity    Pain    LOWER BACK AND RT KNEE AND BOTH SHOULDERS - LEFT SHOULDER PAIN WORSE--PT TOLD ARTHRITIS   Pulmonary embolism (HCC)    LAST ONE WAS 1995- ON CHRONIC COUMADIN    Rectal cancer (HCC)    Sleep apnea 08/09/2012   wears CPAP    PAST SURGICAL HISTORY: Past Surgical History:  Procedure Laterality Date   ABDOMINAL HYSTERECTOMY     CECOSTOMY N/A 03/15/2014   Procedure: partial CECECTOMY;  Surgeon: Eddye Goodie, MD;  Location: [facility];  Service: General;  Laterality: N/A;   COLONOSCOPY     LAPAROSCOPIC APPENDECTOMY N/A 03/15/2014   Procedure: APPENDECTOMY LAPAROSCOPIC;  Surgeon: Eddye Goodie, MD;  Location: [facility];  Service: General;  Laterality: N/A;   LAPAROSCOPIC LYSIS OF ADHESIONS N/A 03/15/2014   Procedure: LAPAROSCOPIC LYSIS OF ADHESIONS;  Surgeon: Eddye Goodie,  MD;  Location: [facility];  Service: General;  Laterality: N/A;   TRANSANAL RECTAL RESECTION  08/2009   T1N0 rectal cancer Dr. Hershell Lose   TUBAL LIGATION      FAMILY HISTORY: Family History  Problem Relation Age of Onset   Cancer Mother        unknown type but patient thinks it was bone marrow cancer   Other Father        complications from severe burns   Heart disease Sister    Other Sister        Red fiber disease   Heart disease Sister    Heart disease Sister    Cancer Brother    Heart disease Brother    Clotting disorder Brother    Colonic polyp Brother    Brain cancer Brother    Heart murmur Brother    Heart failure Brother    Hypertension Brother    Clotting disorder Son    Colon cancer Neg Hx    Stomach cancer Neg Hx    Rectal cancer Neg Hx    Esophageal cancer Neg Hx     SOCIAL HISTORY: Social History   Socioeconomic History   Marital status: Single    Spouse name: Not on file   Number of children: 3   Years of education: Not on file   Highest education level: GED or equivalent  Occupational History    Employer: [employer]   Occupation: MACHINE OPERATOR/retired    Employer: [employer]  Tobacco Use   Smoking status: Every Day    Current packs/day: 0.50    Average packs/day: 0.5 packs/day for 45.4 years (22.7 ttl pk-yrs)    Types: Cigarettes    Start date: 1980   Smokeless tobacco: Never   Tobacco comments:    Currently smoking 8cigs per day as of 12/08/22 ep  Vaping Use   Vaping status: Never Used  Substance and Sexual Activity   Alcohol use: No    Alcohol/week: 0.0 standard drinks of alcohol   Drug use: No   Sexual activity: Yes  Other Topics Concern   Not on file  Social History Narrative   ** Merged History Encounter **       Patient does not regular exercise. He has three boys. Daily caffeine use 2/day.  Patient is right handed   Social Drivers of Corporate investment banker Strain: Not on file  Food Insecurity: Not on file   Transportation Needs: Not on file  Physical Activity: Not on file  Stress: Not on file  Social Connections: Unknown (02/09/2022)   Received from [facility], [facility]   Social Network    Social Network: Not on file  Intimate Partner Violence: Unknown (01/01/2022)   Received from [facility], [facility]   HITS    Physically Hurt: Not on file    Insult or Talk Down To: Not on file    Threaten Physical Harm: Not on file    Scream or Curse: Not on file      Phebe Brasil, M.D. Ph.D.  [facility] [address] [place], [place] [address] Ph: [phone] Fax: [phone]  CC:  Mozell Arias, MD [address] [place],  [place] [address]  Delbert Feathers, MD

## 2024-02-07 LAB — COMPREHENSIVE METABOLIC PANEL WITH GFR
ALT: 20 IU/L (ref 0–32)
AST: 14 IU/L (ref 0–40)
Albumin: 4.2 g/dL (ref 3.9–4.9)
Alkaline Phosphatase: 122 IU/L — ABNORMAL HIGH (ref 44–121)
BUN/Creatinine Ratio: 17 (ref 12–28)
BUN: 17 mg/dL (ref 8–27)
Bilirubin Total: 0.3 mg/dL (ref 0.0–1.2)
CO2: 23 mmol/L (ref 20–29)
Calcium: 9.5 mg/dL (ref 8.7–10.3)
Chloride: 98 mmol/L (ref 96–106)
Creatinine, Ser: 0.99 mg/dL (ref 0.57–1.00)
Globulin, Total: 2.9 g/dL (ref 1.5–4.5)
Glucose: 157 mg/dL — ABNORMAL HIGH (ref 70–99)
Potassium: 3.8 mmol/L (ref 3.5–5.2)
Sodium: 138 mmol/L (ref 134–144)
Total Protein: 7.1 g/dL (ref 6.0–8.5)
eGFR: 63 mL/min/{1.73_m2} (ref >59)

## 2024-02-07 LAB — THYROID PANEL WITH TSH
Free Thyroxine Index: 2.6 (ref 1.2–4.9)
T3 Uptake Ratio: 28 % (ref 24–39)
T4, Total: 9.2 ug/dL (ref 4.5–12.0)
TSH: 1.51 u[IU]/mL (ref 0.450–4.500)

## 2024-02-07 LAB — ACETYLCHOLINE RECEPTOR, BINDING: AChR Binding Ab, Serum: 0.09 nmol/L (ref 0.00–0.24)

## 2024-02-08 ENCOUNTER — Ambulatory Visit: Payer: Self-pay | Admitting: Neurology

## 2024-03-27 ENCOUNTER — Ambulatory Visit: Admitting: Podiatry

## 2024-03-27 ENCOUNTER — Telehealth: Payer: Self-pay | Admitting: Student-PharmD

## 2024-03-27 NOTE — Telephone Encounter (Signed)
 Patient called to ask what dose of Eliquis  she is supposed to be taking. She was last seen in [facility] 07/26/23 and needs to be on anticoagulation long-term. PCP has been supplying refills but per patient they wrote it as 5 mg once daily and she has continued taking it twice daily so she has run out early. Confirmed that she is supposed to take 5 mg twice daily, and she says she will call her PCP to get a new prescription for the correct dose.

## 2024-03-30 ENCOUNTER — Other Ambulatory Visit: Payer: Self-pay | Admitting: Internal Medicine

## 2024-03-30 DIAGNOSIS — Z79899 Other long term (current) drug therapy: Secondary | ICD-10-CM

## 2024-04-04 ENCOUNTER — Encounter: Payer: Self-pay | Admitting: Podiatry

## 2024-04-04 ENCOUNTER — Ambulatory Visit (INDEPENDENT_AMBULATORY_CARE_PROVIDER_SITE_OTHER): Admitting: Podiatry

## 2024-04-04 DIAGNOSIS — B351 Tinea unguium: Secondary | ICD-10-CM

## 2024-04-04 DIAGNOSIS — M79674 Pain in right toe(s): Secondary | ICD-10-CM

## 2024-04-04 DIAGNOSIS — E119 Type 2 diabetes mellitus without complications: Secondary | ICD-10-CM

## 2024-04-04 DIAGNOSIS — M79675 Pain in left toe(s): Secondary | ICD-10-CM

## 2024-04-04 NOTE — Progress Notes (Signed)
This patient returns to my office for at risk foot care.  This patient requires this care by a professional since this patient will be at risk due to having type 2 diabetes. This patient is unable to cut nails herself since the patient cannot reach her nails.These nails are painful walking and wearing shoes.  This patient presents for at risk foot care today.  General Appearance  Alert, conversant and in no acute stress.  Vascular  Dorsalis pedis and posterior tibial  pulses are palpable  bilaterally.  Capillary return is within normal limits  bilaterally. Temperature is within normal limits  bilaterally.  Neurologic  Senn-Weinstein monofilament wire test within normal limits  bilaterally. Muscle power within normal limits bilaterally.  Nails Thick disfigured discolored nails with subungual debris  from hallux to fifth toes bilaterally. No evidence of bacterial infection or drainage bilaterally.  Orthopedic  No limitations of motion  feet .  No crepitus or effusions noted.  No bony pathology or digital deformities noted.  Skin  normotropic skin with no porokeratosis noted bilaterally.  No signs of infections or ulcers noted.     Onychomycosis  Pain in right toes  Pain in left toes  Consent was obtained for treatment procedures.   Mechanical debridement of nails 1-5  bilaterally performed with a nail nipper.  Filed with dremel without incident.    Return office visit    3 months                  Told patient to return for periodic foot care and evaluation due to potential at risk complications.   Shaunta Oncale DPM   

## 2024-05-17 ENCOUNTER — Ambulatory Visit: Admitting: Nurse Practitioner

## 2024-05-31 ENCOUNTER — Encounter (HOSPITAL_BASED_OUTPATIENT_CLINIC_OR_DEPARTMENT_OTHER): Payer: Self-pay | Admitting: Pulmonary Disease

## 2024-05-31 ENCOUNTER — Ambulatory Visit (HOSPITAL_BASED_OUTPATIENT_CLINIC_OR_DEPARTMENT_OTHER): Admitting: Pulmonary Disease

## 2024-05-31 VITALS — BP 129/79 | HR 105 | Ht 66.0 in | Wt 253.0 lb

## 2024-05-31 DIAGNOSIS — Z23 Encounter for immunization: Secondary | ICD-10-CM | POA: Diagnosis not present

## 2024-05-31 DIAGNOSIS — R911 Solitary pulmonary nodule: Secondary | ICD-10-CM

## 2024-05-31 DIAGNOSIS — G4733 Obstructive sleep apnea (adult) (pediatric): Secondary | ICD-10-CM | POA: Diagnosis not present

## 2024-05-31 NOTE — Patient Instructions (Signed)
 X Replacement autoCPAP 10-16 cm to DME  X Ct chest wo con   X Flu shot

## 2024-05-31 NOTE — Progress Notes (Signed)
 Subjective:    Patient ID: Alicia Martinez, female    DOB: March 10, 1956, 68 y.o.   MRN: [id number]     68 yo smoker for FU of OSA &  LLL 7mm pulm nodule. She had low-dose CT chest screening in 2023 which showed 7 mm nodule in the left lower lobe. OSA was diagnosed in 2013 and  maintained on CPAP 15cm since then    Discussed the use of AI scribe software for clinical note transcription with the patient, who gave verbal consent to proceed.  History of Present Illness  Discussed the use of AI scribe software for clinical note transcription with the patient, who gave verbal consent to proceed.  History of Present Illness   Alicia Martinez is a 68 year old female with pulmonary nodule and obstructive sleep apnea who presents for follow-up.  She has not received the replacement CPAP machine due to communication issues with the supplier, [employer], after her phone was replaced. Her old CPAP machine is non-functional, leaving her without a working device, which she believes is affecting her sleep. She reports no breathing difficulties but struggles with sleep due to the lack of a CPAP machine.  She has not obtained the follow-up CT chest scan scheduled for March 2025 to monitor a pulmonary nodule. She is unsure if she has had any CT scans this year, but confirms none were for the lungs. She prefers to have the scan done at [facility].  She continues to smoke approximately five to six cigarettes a day, which is a reduction from her previous habit of half a pack a day.         Significant tests/ events reviewed   LDCT chest 11/2022 7mm stable nodule  LDCT chest 05/2022 LLL 7mm nodule   PSG 12/ 2013 -wt 250 pounds, showed severe obstructive sleep apnea with AHI of 88 events per hour, lowest desaturation of 84 percent. This was corrected by CPAP of 15 cm   Spirometry 10/2014 >>> no  evidence of airway obstruction  Review of Systems  neg for any significant sore throat, dysphagia,  itching, sneezing, nasal congestion or excess/ purulent secretions, fever, chills, sweats, unintended wt loss, pleuritic or exertional cp, hempoptysis, orthopnea pnd or change in chronic leg swelling. Also denies presyncope, palpitations, heartburn, abdominal pain, nausea, vomiting, diarrhea or change in bowel or urinary habits, dysuria,hematuria, rash, arthralgias, visual complaints, headache, numbness weakness or ataxia.      Objective:   Physical Exam  Gen. Pleasant, obese, in no distress ENT - no lesions, no post nasal drip Neck: No JVD, no thyromegaly, no carotid bruits Lungs: no use of accessory muscles, no dullness to percussion, decreased without rales or rhonchi  Cardiovascular: Rhythm regular, heart sounds  normal, no murmurs or gallops, no peripheral edema Musculoskeletal: No deformities, no cyanosis or clubbing , no tremors        Assessment & Plan:   Assessment and Plan Assessment & Plan   Assessment and Plan    Pulmonary nodule under surveillance The pulmonary nodule is under annual surveillance. She missed the follow-up CT chest scan scheduled for March 2025. - Order CT chest scan at the preferred location. - Coordinate with her to schedule the CT scan.  Obstructive sleep apnea Her CPAP machine is non-functional, impacting sleep quality. Replacement has been delayed due to supplier communication issues. - Send a new order for the replacement CPAP machine. - Ensure the supplier has the correct contact information.  Tobacco use She smokes  five to six cigarettes daily, reduced from half a pack per day.  Follow-Up - Administer flu shot during the visit.

## 2024-05-31 NOTE — Addendum Note (Signed)
 Addended by: ALINDA WINN KIDD on: 05/31/2024 04:43 PM   Modules accepted: Orders

## 2024-06-21 ENCOUNTER — Other Ambulatory Visit: Payer: Self-pay | Admitting: Nurse Practitioner

## 2024-06-21 DIAGNOSIS — Z79899 Other long term (current) drug therapy: Secondary | ICD-10-CM

## 2024-07-02 ENCOUNTER — Ambulatory Visit (HOSPITAL_BASED_OUTPATIENT_CLINIC_OR_DEPARTMENT_OTHER)
Admission: RE | Admit: 2024-07-02 | Discharge: 2024-07-02 | Disposition: A | Discharge status: Home / Self Care | Source: Ambulatory Visit | Attending: Pulmonary Disease | Admitting: Pulmonary Disease

## 2024-07-02 DIAGNOSIS — R911 Solitary pulmonary nodule: Secondary | ICD-10-CM | POA: Diagnosis present

## 2024-07-05 ENCOUNTER — Ambulatory Visit: Admitting: Podiatry

## 2024-07-05 ENCOUNTER — Ambulatory Visit: Payer: Self-pay | Admitting: Pulmonary Disease

## 2024-07-05 ENCOUNTER — Encounter: Payer: Self-pay | Admitting: Podiatry

## 2024-07-05 DIAGNOSIS — E119 Type 2 diabetes mellitus without complications: Secondary | ICD-10-CM | POA: Diagnosis not present

## 2024-07-05 DIAGNOSIS — M79674 Pain in right toe(s): Secondary | ICD-10-CM | POA: Diagnosis not present

## 2024-07-05 DIAGNOSIS — B351 Tinea unguium: Secondary | ICD-10-CM

## 2024-07-05 DIAGNOSIS — M79675 Pain in left toe(s): Secondary | ICD-10-CM | POA: Diagnosis not present

## 2024-07-05 NOTE — Progress Notes (Signed)
This patient returns to my office for at risk foot care.  This patient requires this care by a professional since this patient will be at risk due to having type 2 diabetes. This patient is unable to cut nails herself since the patient cannot reach her nails.These nails are painful walking and wearing shoes.  This patient presents for at risk foot care today.  General Appearance  Alert, conversant and in no acute stress.  Vascular  Dorsalis pedis and posterior tibial  pulses are palpable  bilaterally.  Capillary return is within normal limits  bilaterally. Temperature is within normal limits  bilaterally.  Neurologic  Senn-Weinstein monofilament wire test within normal limits  bilaterally. Muscle power within normal limits bilaterally.  Nails Thick disfigured discolored nails with subungual debris  from hallux to fifth toes bilaterally. No evidence of bacterial infection or drainage bilaterally.  Orthopedic  No limitations of motion  feet .  No crepitus or effusions noted.  No bony pathology or digital deformities noted.  Skin  normotropic skin with no porokeratosis noted bilaterally.  No signs of infections or ulcers noted.     Onychomycosis  Pain in right toes  Pain in left toes  Consent was obtained for treatment procedures.   Mechanical debridement of nails 1-5  bilaterally performed with a nail nipper.  Filed with dremel without incident.    Return office visit    3 months                  Told patient to return for periodic foot care and evaluation due to potential at risk complications.   Shaunta Oncale DPM   

## 2024-07-24 ENCOUNTER — Ambulatory Visit: Admitting: Orthopaedic Surgery

## 2024-07-30 ENCOUNTER — Encounter: Payer: Self-pay | Admitting: Radiology

## 2024-08-06 ENCOUNTER — Ambulatory Visit: Admitting: Internal Medicine

## 2024-08-07 ENCOUNTER — Ambulatory Visit: Admitting: Orthopaedic Surgery

## 2024-08-07 ENCOUNTER — Other Ambulatory Visit: Payer: Self-pay

## 2024-08-07 VITALS — Ht 65.5 in | Wt 253.0 lb

## 2024-08-07 DIAGNOSIS — M25562 Pain in left knee: Secondary | ICD-10-CM

## 2024-08-07 DIAGNOSIS — M25561 Pain in right knee: Secondary | ICD-10-CM

## 2024-08-07 DIAGNOSIS — M1711 Unilateral primary osteoarthritis, right knee: Secondary | ICD-10-CM

## 2024-08-07 DIAGNOSIS — M1712 Unilateral primary osteoarthritis, left knee: Secondary | ICD-10-CM

## 2024-08-07 DIAGNOSIS — M170 Bilateral primary osteoarthritis of knee: Secondary | ICD-10-CM

## 2024-08-07 DIAGNOSIS — Z6841 Body Mass Index (BMI) 40.0 and over, adult: Secondary | ICD-10-CM

## 2024-08-07 DIAGNOSIS — G8929 Other chronic pain: Secondary | ICD-10-CM

## 2024-08-07 MED ORDER — LIDOCAINE HCL 1 % IJ SOLN
2.0000 mL | INTRAMUSCULAR | Status: AC | PRN
  Administered 2024-08-07: 2 mL

## 2024-08-07 MED ORDER — BUPIVACAINE HCL 0.5 % IJ SOLN
2.0000 mL | INTRAMUSCULAR | Status: AC | PRN
  Administered 2024-08-07: 2 mL via INTRA_ARTICULAR

## 2024-08-07 MED ORDER — METHYLPREDNISOLONE ACETATE 40 MG/ML IJ SUSP
40.0000 mg | INTRAMUSCULAR | Status: AC | PRN
  Administered 2024-08-07: 40 mg via INTRA_ARTICULAR

## 2024-08-07 NOTE — Progress Notes (Signed)
 Office Visit Note   Patient: Alicia Martinez           Date of Birth: 1956/02/21           MRN: [id number] Visit Date: 08/07/2024              Requested by: Trudy Vaughan HERO, MD [address] [place],  [place] [address] PCP: Trudy Vaughan HERO, MD   Assessment & Plan: Visit Diagnoses:  1. Primary osteoarthritis of right knee   2. Primary osteoarthritis of left knee   3. Body mass index 40.0-44.9, adult (HCC)     Plan: History of Present Illness Alicia Martinez is a 68 year old female with arthritis who presents with worsening bilateral knee pain.  She has experienced worsening bilateral knee pain over the past year, now requiring a cane for ambulation. She also reports significant foot pain and questions if it is related to her arthritis. Her medical history includes arthritis, previously managed with cortisone injections.  Physical Exam Bilateral knee exams are unchanged from prior evaluation.  Assessment and Plan Bilateral primary knee osteoarthritis Chronic bilateral knee pain due to primary osteoarthritis, worsening over the past year. Surgical intervention deferred until weight loss is achieved. - Administered cortisone injections to both knees.  Obesity with BMI 40.0-44.9 impacting musculoskeletal health Obesity with BMI 40.0-44.9 contributes to increased pressure on the musculoskeletal system, particularly knees and feet. Weight loss necessary for reducing strain and enabling knee surgery. - Advised weight loss to reduce musculoskeletal strain and enable surgical intervention for knee osteoarthritis.  The patient meets the AMA guidelines for Morbid (severe) obesity with a BMI > 40.0 and I have recommended weight loss.  Follow-Up Instructions: No follow-ups on file.   Orders:  Orders Placed This Encounter  Procedures   Large Joint Inj: bilateral knee   XR KNEE 3 VIEW LEFT   XR KNEE 3 VIEW RIGHT   No orders of the defined types were placed in this  encounter.     Procedures: Large Joint Inj: bilateral knee on 08/07/2024 2:12 PM Indications: pain Details: 22 G needle  Arthrogram: No  Medications (Right): 2 mL lidocaine  1 %; 2 mL bupivacaine  0.5 %; 40 mg methylPREDNISolone  acetate 40 MG/ML Medications (Left): 2 mL lidocaine  1 %; 2 mL bupivacaine  0.5 %; 40 mg methylPREDNISolone  acetate 40 MG/ML Outcome: tolerated well, no immediate complications Patient was prepped and draped in the usual sterile fashion.       Clinical Data: No additional findings.   Subjective: Chief Complaint  Patient presents with   Left Knee - Pain   Right Knee - Pain    HPI  Review of Systems  Constitutional: Negative.   HENT: Negative.    Eyes: Negative.   Respiratory: Negative.    Cardiovascular: Negative.   Endocrine: Negative.   Musculoskeletal: Negative.   Neurological: Negative.   Hematological: Negative.   Psychiatric/Behavioral: Negative.    All other systems reviewed and are negative.    Objective: Vital Signs: Ht 5' 5.5 (1.664 m)   Wt 253 lb (114.8 kg)   BMI 41.46 kg/m   Physical Exam Vitals and nursing note reviewed.  Constitutional:      Appearance: She is well-developed.  HENT:     Head: Atraumatic.     Nose: Nose normal.  Eyes:     Extraocular Movements: Extraocular movements intact.  Cardiovascular:     Pulses: Normal pulses.  Pulmonary:     Effort: Pulmonary effort is normal.  Abdominal:  Palpations: Abdomen is soft.  Musculoskeletal:     Cervical back: Neck supple.  Skin:    General: Skin is warm.     Capillary Refill: Capillary refill takes less than 2 seconds.  Neurological:     Mental Status: She is alert. Mental status is at baseline.  Psychiatric:        Behavior: Behavior normal.        Thought Content: Thought content normal.        Judgment: Judgment normal.     Ortho Exam  Specialty Comments:  No specialty comments available.  Imaging: XR KNEE 3 VIEW LEFT Result Date:  08/07/2024 X-rays of the left knee show advanced tricompartmental osteoarthritis.  Bone-on-bone joint space narrowing.  Kellgren-Lawrence stage IV  XR KNEE 3 VIEW RIGHT Result Date: 08/07/2024 X-rays demonstrate severe tricompartmental osteoarthritis.  Bone-on-bone joint space narrowing.  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 02/11/2014   Osteoarthritis of multiple joints 12/31/2013   Health care maintenance 10/31/2012   Obstructive sleep apnea on CPAP 08/09/2012   Adnexal mass 01/18/2011   Long term current use of anticoagulants due to h/o PE and recurrent DVT with INR goal of 2.0-3.0 10/17/2010   Hematuria 12/01/2009   ADENOCARCINOMA, RECTUM 08/06/2009   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 ROSS - OFFICE NOTES IS EPIC 08/24/13 - EVENT MONITOR SHOWED NO SIGNIFICANT ARRHYTHMIA   History of  rectal cancer    T1N0 resected 08/2009   Hypertension    Infectious colitis    Obesity    Pain    LOWER BACK AND RT KNEE AND BOTH SHOULDERS - LEFT SHOULDER PAIN WORSE--PT TOLD ARTHRITIS   Pulmonary embolism (HCC)    LAST ONE WAS 1995- ON CHRONIC COUMADIN    Rectal cancer (HCC)    Sleep apnea 08/09/2012   wears CPAP    Family History  Problem Relation Age of Onset   Cancer Mother        unknown type but patient thinks it was bone marrow cancer   Other Father        complications from severe burns   Heart disease Sister    Other Sister        Red fiber disease   Heart disease Sister    Heart disease Sister    Cancer Brother  Heart disease Brother    Clotting disorder Brother    Colonic polyp Brother    Brain cancer Brother    Heart murmur Brother    Heart failure Brother    Hypertension Brother    Clotting disorder Son    Colon cancer Neg Hx    Stomach cancer Neg Hx    Rectal cancer Neg Hx    Esophageal cancer Neg Hx     Past Surgical History:  Procedure Laterality Date   ABDOMINAL HYSTERECTOMY     CECOSTOMY N/A 03/15/2014   Procedure: partial CECECTOMY;  Surgeon: Elspeth KYM Schultze, MD;  Location: [facility];  Service: General;  Laterality: N/A;   COLONOSCOPY     LAPAROSCOPIC APPENDECTOMY N/A 03/15/2014   Procedure: APPENDECTOMY LAPAROSCOPIC;  Surgeon: Elspeth KYM Schultze, MD;  Location: [facility];  Service: General;  Laterality: N/A;   LAPAROSCOPIC LYSIS OF ADHESIONS N/A 03/15/2014   Procedure: LAPAROSCOPIC LYSIS OF ADHESIONS;  Surgeon: Elspeth KYM Schultze, MD;  Location: [facility];  Service: General;  Laterality: N/A;   TRANSANAL RECTAL RESECTION  08/2009   T1N0 rectal cancer Dr. Schultze   TUBAL LIGATION     Social History   Occupational History    Employer: [employer]   Occupation: MACHINE OPERATOR/retired    Employer: [employer]  Tobacco Use   Smoking status: Every Day    Current packs/day: 0.50    Average packs/day: 0.5 packs/day for 45.9 years (22.9 ttl pk-yrs)     Types: Cigarettes    Start date: 1980   Smokeless tobacco: Never   Tobacco comments:    Currently smoking 8cigs per day as of 12/08/22 ep  Vaping Use   Vaping status: Never Used  Substance and Sexual Activity   Alcohol use: No    Alcohol/week: 0.0 standard drinks of alcohol   Drug use: No   Sexual activity: Yes

## 2024-08-30 ENCOUNTER — Telehealth (HOSPITAL_BASED_OUTPATIENT_CLINIC_OR_DEPARTMENT_OTHER): Payer: Self-pay

## 2024-08-30 DIAGNOSIS — G4733 Obstructive sleep apnea (adult) (pediatric): Secondary | ICD-10-CM

## 2024-08-30 NOTE — Telephone Encounter (Signed)
 Replacement CPAP ordered will see what [employer] says about sleep study

## 2024-08-30 NOTE — Telephone Encounter (Signed)
 Copied from CRM #[id number]. Topic: Clinical - Request for Lab/Test Order >> Aug 30, 2024 12:32 PM Devaughn RAMAN wrote: Reason for CRM: Pt stated she spoke with DME company as she was supposed to get a CPAP machine and she advised her she will need to do a sleep study before she is eligible for the CPAP machine.

## 2024-09-09 NOTE — Progress Notes (Deleted)
  Pt is  no show for appt  

## 2024-09-11 ENCOUNTER — Ambulatory Visit: Admitting: Internal Medicine

## 2024-09-16 ENCOUNTER — Other Ambulatory Visit: Payer: Self-pay | Admitting: Internal Medicine

## 2024-09-16 DIAGNOSIS — Z79899 Other long term (current) drug therapy: Secondary | ICD-10-CM

## 2024-10-02 ENCOUNTER — Telehealth (HOSPITAL_BASED_OUTPATIENT_CLINIC_OR_DEPARTMENT_OTHER): Payer: Self-pay

## 2024-10-02 NOTE — Telephone Encounter (Signed)
 CMN received for CPAP supplies sent to [facility]  signed by provider and faxed confirmation received

## 2024-10-03 ENCOUNTER — Other Ambulatory Visit: Payer: Self-pay | Admitting: Internal Medicine

## 2024-10-03 DIAGNOSIS — Z79899 Other long term (current) drug therapy: Secondary | ICD-10-CM

## 2024-10-08 ENCOUNTER — Ambulatory Visit: Admitting: Podiatry

## 2024-10-26 ENCOUNTER — Other Ambulatory Visit: Payer: Self-pay | Admitting: Physician Assistant

## 2024-10-26 DIAGNOSIS — Z79899 Other long term (current) drug therapy: Secondary | ICD-10-CM
